# Patient Record
Sex: Female | Born: 1935 | Race: White | Hispanic: No | Smoking: Never smoker
Health system: Southern US, Community
[De-identification: ages and names within clinical notes are randomized; demographics above are authoritative.]

## PROBLEM LIST (undated history)

## (undated) DIAGNOSIS — B019 Varicella without complication: Secondary | ICD-10-CM

## (undated) DIAGNOSIS — E119 Type 2 diabetes mellitus without complications: Secondary | ICD-10-CM

## (undated) DIAGNOSIS — E785 Hyperlipidemia, unspecified: Secondary | ICD-10-CM

## (undated) DIAGNOSIS — I10 Essential (primary) hypertension: Secondary | ICD-10-CM

## (undated) DIAGNOSIS — H269 Unspecified cataract: Secondary | ICD-10-CM

## (undated) DIAGNOSIS — Z460 Encounter for fitting and adjustment of spectacles and contact lenses: Secondary | ICD-10-CM

## (undated) DIAGNOSIS — K5140 Inflammatory polyps of colon without complications: Secondary | ICD-10-CM

## (undated) DIAGNOSIS — C50919 Malignant neoplasm of unspecified site of unspecified female breast: Secondary | ICD-10-CM

## (undated) HISTORY — DX: Essential (primary) hypertension: I10

## (undated) HISTORY — DX: Unspecified cataract: H26.9

## (undated) HISTORY — DX: Hyperlipidemia, unspecified: E78.5

## (undated) HISTORY — DX: Malignant neoplasm of unspecified site of unspecified female breast: C50.919

## (undated) HISTORY — DX: Inflammatory polyps of colon without complications: K51.40

---

## 1998-03-09 ENCOUNTER — Other Ambulatory Visit: Admission: RE | Admit: 1998-03-09 | Discharge: 1998-03-09 | Payer: Self-pay | Admitting: Gynecology

## 1999-03-29 ENCOUNTER — Other Ambulatory Visit: Admission: RE | Admit: 1999-03-29 | Discharge: 1999-03-29 | Payer: Self-pay | Admitting: Gynecology

## 2000-04-07 ENCOUNTER — Encounter: Payer: Self-pay | Admitting: Gynecology

## 2000-04-07 ENCOUNTER — Encounter: Admission: RE | Admit: 2000-04-07 | Discharge: 2000-04-07 | Payer: Self-pay | Admitting: Gynecology

## 2000-04-07 ENCOUNTER — Other Ambulatory Visit: Admission: RE | Admit: 2000-04-07 | Discharge: 2000-04-07 | Payer: Self-pay | Admitting: Gynecology

## 2000-08-17 ENCOUNTER — Other Ambulatory Visit: Admission: RE | Admit: 2000-08-17 | Discharge: 2000-08-17 | Payer: Self-pay | Admitting: Gynecology

## 2001-04-07 ENCOUNTER — Encounter: Admission: RE | Admit: 2001-04-07 | Discharge: 2001-04-07 | Payer: Self-pay | Admitting: Internal Medicine

## 2001-04-07 ENCOUNTER — Other Ambulatory Visit: Admission: RE | Admit: 2001-04-07 | Discharge: 2001-04-07 | Payer: Self-pay | Admitting: Gynecology

## 2001-04-07 ENCOUNTER — Encounter: Payer: Self-pay | Admitting: Gynecology

## 2002-04-07 ENCOUNTER — Encounter: Admission: RE | Admit: 2002-04-07 | Discharge: 2002-04-07 | Payer: Self-pay | Admitting: Gynecology

## 2002-04-07 ENCOUNTER — Other Ambulatory Visit: Admission: RE | Admit: 2002-04-07 | Discharge: 2002-04-07 | Payer: Self-pay | Admitting: Gynecology

## 2002-04-07 ENCOUNTER — Encounter: Payer: Self-pay | Admitting: Gynecology

## 2003-04-12 ENCOUNTER — Encounter: Payer: Self-pay | Admitting: Gynecology

## 2003-04-12 ENCOUNTER — Encounter: Admission: RE | Admit: 2003-04-12 | Discharge: 2003-04-12 | Payer: Self-pay | Admitting: Gynecology

## 2003-04-17 ENCOUNTER — Other Ambulatory Visit: Admission: RE | Admit: 2003-04-17 | Discharge: 2003-04-17 | Payer: Self-pay | Admitting: Gynecology

## 2003-09-04 ENCOUNTER — Other Ambulatory Visit: Admission: RE | Admit: 2003-09-04 | Discharge: 2003-09-04 | Payer: Self-pay | Admitting: Gynecology

## 2004-04-16 ENCOUNTER — Other Ambulatory Visit: Admission: RE | Admit: 2004-04-16 | Discharge: 2004-04-16 | Payer: Self-pay | Admitting: Gynecology

## 2005-05-01 ENCOUNTER — Other Ambulatory Visit: Admission: RE | Admit: 2005-05-01 | Discharge: 2005-05-01 | Payer: Self-pay | Admitting: Gynecology

## 2005-05-20 ENCOUNTER — Encounter: Admission: RE | Admit: 2005-05-20 | Discharge: 2005-05-20 | Payer: Self-pay | Admitting: Gynecology

## 2005-06-30 ENCOUNTER — Other Ambulatory Visit: Admission: RE | Admit: 2005-06-30 | Discharge: 2005-06-30 | Payer: Self-pay | Admitting: Gynecology

## 2006-05-04 ENCOUNTER — Other Ambulatory Visit: Admission: RE | Admit: 2006-05-04 | Discharge: 2006-05-04 | Payer: Self-pay | Admitting: Gynecology

## 2006-05-21 ENCOUNTER — Encounter: Admission: RE | Admit: 2006-05-21 | Discharge: 2006-05-21 | Payer: Self-pay | Admitting: Gynecology

## 2007-05-05 ENCOUNTER — Other Ambulatory Visit: Admission: RE | Admit: 2007-05-05 | Discharge: 2007-05-05 | Payer: Self-pay | Admitting: Gynecology

## 2007-05-24 ENCOUNTER — Encounter: Admission: RE | Admit: 2007-05-24 | Discharge: 2007-05-24 | Payer: Self-pay | Admitting: Gynecology

## 2008-05-25 ENCOUNTER — Encounter: Admission: RE | Admit: 2008-05-25 | Discharge: 2008-05-25 | Payer: Self-pay | Admitting: Endocrinology

## 2008-07-04 ENCOUNTER — Ambulatory Visit: Payer: Self-pay | Admitting: Unknown Physician Specialty

## 2009-06-12 ENCOUNTER — Encounter: Admission: RE | Admit: 2009-06-12 | Discharge: 2009-06-12 | Payer: Self-pay | Admitting: Gynecology

## 2009-12-22 LAB — HM PAP SMEAR

## 2010-06-13 ENCOUNTER — Encounter: Admission: RE | Admit: 2010-06-13 | Discharge: 2010-06-13 | Payer: Self-pay | Admitting: Gynecology

## 2011-05-16 ENCOUNTER — Other Ambulatory Visit: Payer: Self-pay | Admitting: Gynecology

## 2011-05-16 DIAGNOSIS — Z1231 Encounter for screening mammogram for malignant neoplasm of breast: Secondary | ICD-10-CM

## 2011-06-17 ENCOUNTER — Other Ambulatory Visit: Payer: Self-pay | Admitting: Gynecology

## 2011-06-17 ENCOUNTER — Ambulatory Visit
Admission: RE | Admit: 2011-06-17 | Discharge: 2011-06-17 | Disposition: A | Discharge status: Home / Self Care | Payer: PRIVATE HEALTH INSURANCE | Source: Ambulatory Visit | Attending: Gynecology | Admitting: Gynecology

## 2011-06-17 ENCOUNTER — Other Ambulatory Visit: Payer: Self-pay | Admitting: Unknown Physician Specialty

## 2011-06-17 DIAGNOSIS — Z1231 Encounter for screening mammogram for malignant neoplasm of breast: Secondary | ICD-10-CM

## 2011-09-23 HISTORY — PX: KNEE ARTHROSCOPY W/ MENISCAL REPAIR: SHX1877

## 2011-10-07 ENCOUNTER — Ambulatory Visit: Payer: Self-pay | Admitting: Unknown Physician Specialty

## 2011-10-23 LAB — HM COLONOSCOPY

## 2012-05-21 ENCOUNTER — Other Ambulatory Visit: Payer: Self-pay | Admitting: Internal Medicine

## 2012-05-21 DIAGNOSIS — Z1231 Encounter for screening mammogram for malignant neoplasm of breast: Secondary | ICD-10-CM

## 2012-06-17 ENCOUNTER — Ambulatory Visit
Admission: RE | Admit: 2012-06-17 | Discharge: 2012-06-17 | Disposition: A | Discharge status: Home / Self Care | Payer: PRIVATE HEALTH INSURANCE | Source: Ambulatory Visit | Attending: Internal Medicine | Admitting: Internal Medicine

## 2012-06-17 DIAGNOSIS — Z1231 Encounter for screening mammogram for malignant neoplasm of breast: Secondary | ICD-10-CM

## 2012-06-18 ENCOUNTER — Other Ambulatory Visit: Payer: Self-pay | Admitting: Internal Medicine

## 2012-06-18 DIAGNOSIS — R928 Other abnormal and inconclusive findings on diagnostic imaging of breast: Secondary | ICD-10-CM

## 2012-07-02 ENCOUNTER — Ambulatory Visit
Admission: RE | Admit: 2012-07-02 | Discharge: 2012-07-02 | Disposition: A | Discharge status: Home / Self Care | Payer: PRIVATE HEALTH INSURANCE | Source: Ambulatory Visit | Attending: Internal Medicine | Admitting: Internal Medicine

## 2012-07-02 ENCOUNTER — Other Ambulatory Visit: Payer: Self-pay | Admitting: Internal Medicine

## 2012-07-02 DIAGNOSIS — R928 Other abnormal and inconclusive findings on diagnostic imaging of breast: Secondary | ICD-10-CM

## 2012-07-05 ENCOUNTER — Other Ambulatory Visit: Payer: Self-pay | Admitting: Internal Medicine

## 2012-07-05 DIAGNOSIS — C50912 Malignant neoplasm of unspecified site of left female breast: Secondary | ICD-10-CM

## 2012-07-06 ENCOUNTER — Telehealth: Payer: Self-pay | Admitting: *Deleted

## 2012-07-06 ENCOUNTER — Other Ambulatory Visit: Payer: Self-pay | Admitting: *Deleted

## 2012-07-06 DIAGNOSIS — C50519 Malignant neoplasm of lower-outer quadrant of unspecified female breast: Secondary | ICD-10-CM | POA: Insufficient documentation

## 2012-07-06 NOTE — Telephone Encounter (Signed)
Confirmed [facility] for 07/14/12 at 1200 .  Instructions and contact information given.

## 2012-07-07 ENCOUNTER — Encounter: Payer: Self-pay | Admitting: *Deleted

## 2012-07-07 NOTE — Progress Notes (Signed)
Mailed after appt letter to pt. 

## 2012-07-08 ENCOUNTER — Ambulatory Visit
Admission: RE | Admit: 2012-07-08 | Discharge: 2012-07-08 | Disposition: A | Discharge status: Home / Self Care | Payer: PRIVATE HEALTH INSURANCE | Source: Ambulatory Visit | Attending: Internal Medicine | Admitting: Internal Medicine

## 2012-07-08 DIAGNOSIS — C50912 Malignant neoplasm of unspecified site of left female breast: Secondary | ICD-10-CM

## 2012-07-08 MED ORDER — GADOBENATE DIMEGLUMINE 529 MG/ML IV SOLN
8.0000 mL | Freq: Once | INTRAVENOUS | Status: AC | PRN
  Administered 2012-07-08: 8 mL via INTRAVENOUS

## 2012-07-14 ENCOUNTER — Ambulatory Visit: Payer: PRIVATE HEALTH INSURANCE

## 2012-07-14 ENCOUNTER — Other Ambulatory Visit (HOSPITAL_BASED_OUTPATIENT_CLINIC_OR_DEPARTMENT_OTHER): Payer: PRIVATE HEALTH INSURANCE | Admitting: Lab

## 2012-07-14 ENCOUNTER — Encounter: Payer: Self-pay | Admitting: Specialist

## 2012-07-14 ENCOUNTER — Ambulatory Visit (HOSPITAL_BASED_OUTPATIENT_CLINIC_OR_DEPARTMENT_OTHER): Payer: PRIVATE HEALTH INSURANCE | Admitting: General Surgery

## 2012-07-14 ENCOUNTER — Encounter: Payer: Self-pay | Admitting: Radiation Oncology

## 2012-07-14 ENCOUNTER — Encounter: Payer: Self-pay | Admitting: *Deleted

## 2012-07-14 ENCOUNTER — Ambulatory Visit
Admission: RE | Admit: 2012-07-14 | Discharge: 2012-07-14 | Disposition: A | Discharge status: Home / Self Care | Payer: PRIVATE HEALTH INSURANCE | Source: Ambulatory Visit | Attending: Radiation Oncology | Admitting: Radiation Oncology

## 2012-07-14 ENCOUNTER — Ambulatory Visit: Payer: PRIVATE HEALTH INSURANCE | Admitting: Physical Therapy

## 2012-07-14 ENCOUNTER — Ambulatory Visit (HOSPITAL_BASED_OUTPATIENT_CLINIC_OR_DEPARTMENT_OTHER): Payer: PRIVATE HEALTH INSURANCE | Admitting: Oncology

## 2012-07-14 ENCOUNTER — Encounter (INDEPENDENT_AMBULATORY_CARE_PROVIDER_SITE_OTHER): Payer: Self-pay | Admitting: General Surgery

## 2012-07-14 VITALS — BP 157/82 | HR 87 | Temp 98.9°F | Resp 20 | Ht 66.0 in | Wt 176.0 lb

## 2012-07-14 DIAGNOSIS — C50519 Malignant neoplasm of lower-outer quadrant of unspecified female breast: Secondary | ICD-10-CM

## 2012-07-14 DIAGNOSIS — Z170 Estrogen receptor positive status [ER+]: Secondary | ICD-10-CM

## 2012-07-14 LAB — CBC WITH DIFFERENTIAL/PLATELET
BASO%: 0.8 % (ref 0.0–2.0)
Eosinophils Absolute: 0.1 10*3/uL (ref 0.0–0.5)
MCHC: 33.5 g/dL (ref 31.5–36.0)
MONO#: 0.7 10*3/uL (ref 0.1–0.9)
NEUT#: 4.2 10*3/uL (ref 1.5–6.5)
Platelets: 249 10*3/uL (ref 145–400)
RBC: 4.74 10*6/uL (ref 3.70–5.45)
RDW: 13.1 % (ref 11.2–14.5)
WBC: 8.5 10*3/uL (ref 3.9–10.3)
lymph#: 3.4 10*3/uL — ABNORMAL HIGH (ref 0.9–3.3)

## 2012-07-14 LAB — COMPREHENSIVE METABOLIC PANEL
ALT: 29 U/L (ref 0–35)
Albumin: 4.1 g/dL (ref 3.5–5.2)
CO2: 30 mEq/L (ref 19–32)
Chloride: 102 mEq/L (ref 96–112)
Glucose, Bld: 98 mg/dL (ref 70–99)
Potassium: 4 mEq/L (ref 3.5–5.3)
Sodium: 141 mEq/L (ref 135–145)
Total Protein: 7.5 g/dL (ref 6.0–8.3)

## 2012-07-14 LAB — CANCER ANTIGEN 27.29: CA 27.29: 35 U/mL (ref 0–39)

## 2012-07-14 NOTE — Progress Notes (Signed)
Patient ID: Haley Love, female   DOB: 02/09/1934, 78 y.o.   MRN: [id number]  No chief complaint on file.   HPI Haley Love is a 78 y.o. female.  She is referred by Dr. 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  This patient is quite healthy for her age. She has had no prior breast problems. Recent recent screening mammograms show a 1 cm spiculated mass in the left breast at the 3:30 position, 5 cm from the nipple. Ultrasound of the left axilla is normal.  Image guided biopsy shows mixed invasive ductal carcinoma and DCIS, ER 100%, PR 39%, proliferation index 7%, HER-2/neu negative.  MRI was performed showing a 9 mm mass in the left breast at the 3:30 position. A solitary finding. The MRI was otherwise negative.  We have discussed her imaging studies, pathology slides, and treatment planning in breast conference this morning.  The patient is strongly motivated towards breast conservation surgery.  Family history reveals breast cancer in a paternal aunt and in the daughter of the same paternal aunt. HPI  Past Medical History  Diagnosis Date  . Breast cancer   . Hypertension     No past surgical history on file.  Family History  Problem Relation Age of Onset  . Lung cancer Mother   . Throat cancer Father     Social History History  Substance Use Topics  . Smoking status: Never Smoker   . Smokeless tobacco: Not on file  . Alcohol Use: Yes    No Known Allergies  Current Outpatient Prescriptions  Medication Sig Dispense Refill  . aspirin 81 MG tablet Take 81 mg by mouth daily.      Marland Kitchen lisinopril-hydrochlorothiazide (PRINZIDE,ZESTORETIC) 10-12.5 MG per tablet Take 1 tablet by mouth daily.      . Multiple Vitamins-Minerals (MULTIVITAMIN WITH MINERALS) tablet Take 1 tablet by mouth daily.        Review of Systems Review of Systems  Constitutional: Negative for fever, chills and  unexpected weight change.  HENT: Negative for hearing loss, congestion, sore throat, trouble swallowing and voice change.   Eyes: Negative for visual disturbance.  Respiratory: Negative for cough and wheezing.   Cardiovascular: Negative for chest pain, palpitations and leg swelling.  Gastrointestinal: Negative for nausea, vomiting, abdominal pain, diarrhea, constipation, blood in stool, abdominal distention and anal bleeding.  Genitourinary: Negative for hematuria, vaginal bleeding and difficulty urinating.  Musculoskeletal: Negative for arthralgias.  Skin: Negative for rash and wound.  Neurological: Negative for seizures, syncope and headaches.  Hematological: Negative for adenopathy. Does not bruise/bleed easily.  Psychiatric/Behavioral: Negative for confusion.    There were no vitals taken for this visit.  Physical Exam Physical Exam  Constitutional: She is oriented to person, place, and time. She appears well-developed and well-nourished. No distress.  HENT:  Head: Normocephalic and atraumatic.  Nose: Nose normal.  Mouth/Throat: No oropharyngeal exudate.  Eyes: Conjunctivae and EOM are normal. Pupils are equal, round, and reactive to light. Left eye exhibits no discharge. No scleral icterus.  Neck: Neck supple. No JVD present. No tracheal deviation present. No thyromegaly present.  Cardiovascular: Normal rate, regular rhythm, normal heart sounds and intact distal pulses.   No murmur heard. Pulmonary/Chest: Effort normal and breath sounds normal. No respiratory distress. She has no wheezes. She has no rales. She exhibits no tenderness.       Breasts are medium size. Small bruise  lower outer quadrant left breast, no palpable mass in either breast. No other skin change. No axillary adenopathy.  Abdominal: Soft. Bowel sounds are normal. She exhibits no distension and no mass. There is no tenderness. There is no rebound and no guarding.  Musculoskeletal: She exhibits no edema and no  tenderness.  Lymphadenopathy:    She has no cervical adenopathy.  Neurological: She is alert and oriented to person, place, and time. She exhibits normal muscle tone. Coordination normal.  Skin: Skin is warm. No rash noted. She is not diaphoretic. No erythema. No pallor.  Psychiatric: She has a normal mood and affect. Her behavior is normal. Judgment and thought content normal.    Data Reviewed We have reviewed her imaging studies and pathology slides at breast conference this morning. I have coordinated her treatment plan with Dr. Dr. [name] and Dr. Gery Pray.  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 ER 100%, PR 39%, with HER-2 negative, perforation index 7%. Clinical stage T1b, N0.  Hypertension  Patient desires breast conservation surgery.    Plan    We had a long discussion about treatment options including partial mastectomy, total mastectomy, sentinel node biopsy, as well as radiation therapy and antiestrogen therapy.  She will be scheduled for left partial mastectomy with needle localization. We do not think that she would benefit from sentinel node biopsy, and we explained that to her. She is comfortable with this plan  I discussed the indications, details, techniques, and numerous risks in detail with the patient and her family. She understands these issues. Her questions were answered. She agrees with this plan.       Edsel Petrin. Dalbert Batman, M.D., [facility] General and Minimally invasive Surgery Breast and Colorectal Surgery Office:   [phone] Pager:   [phone]  07/14/2012, 4:10 PM

## 2012-07-14 NOTE — Progress Notes (Signed)
I met Haley Love in multidisciplinary clinic today.  In reviewing her distress scale, she indicated her distress level is "0".  She said she is very peaceful about her diagnosis and did not feel she needs any support services at this time.

## 2012-07-14 NOTE — Progress Notes (Signed)
Radiation Oncology         [phone] ________________________________  Initial outpatient Consultation  Name: Haley Love MRN: [id number]  Date: 07/14/2012  DOB: 07-14-1934  CC:No primary provider on file.  Adin Hector, MD   REFERRING PHYSICIAN: Adin Hector, MD  DIAGNOSIS: The encounter diagnosis was Cancer of lower-outer quadrant of female breast.  HISTORY OF PRESENT ILLNESS::Haley Love is a 78 y.o. female who is seen as part of the multidisciplinary breast clinic. Earlier this year the patient was noted to have some changes within the lower outer aspect of the left breast on routine screening mammography. A digital diagnostic left mammogram revealed a persistent 1 cm spiculated mass within the lateral aspect of the breast. On ultrasound this showed an irregular hypoechoic mass which measured 8 x 6 x 6 mm on ultrasound. Patient she did undergo biopsy of this area which revealed invasive ductal carcinoma with some associated ductal carcinoma in situ. The tumor was felt to be low-grade. Estrogen and progesterone receptor receptors were +100% and 39% respectively. the patient s did undergo a MRI of the bilateral breast area which revealed a solitary mass within the 3:30 position of left breast. This measures 9 mm in greatest dimension. There is no other lesions noted with either breast or suspicious axillary lymph nodes. With this information the patient seen in the multidisciplinary breast clinic.   PREVIOUS RADIATION THERAPY: No  PAST MEDICAL HISTORY:  has a past medical history of Breast cancer and Hypertension.    PAST SURGICAL HISTORY:No past surgical history on file.  FAMILY HISTORY: family history includes Lung cancer in her mother and Throat cancer in her father.  SOCIAL HISTORY:  reports that she has never smoked. She does not have any smokeless tobacco history on file. She reports that she drinks alcohol. She reports that she does not use illicit  drugs.  ALLERGIES: Review of patient's allergies indicates no known allergies.  MEDICATIONS:  Current Outpatient Prescriptions  Medication Sig Dispense Refill  . aspirin 81 MG tablet Take 81 mg by mouth daily.      Marland Kitchen lisinopril-hydrochlorothiazide (PRINZIDE,ZESTORETIC) 10-12.5 MG per tablet Take 1 tablet by mouth daily.      . Multiple Vitamins-Minerals (MULTIVITAMIN WITH MINERALS) tablet Take 1 tablet by mouth daily.        REVIEW OF SYSTEMS:  A 15 point review of systems is documented in the electronic medical record. This was obtained by the nursing staff. However, I reviewed this with the patient to discuss relevant findings and make appropriate changes. Prior to biopsy the patient denied any pain in the breast, nipple discharge or bleeding. She denies any bone pain or headaches.   PHYSICAL EXAM:  vitals were not taken for this visit. this is a healthy-appearing female who appears younger than her stated age. Examination of the neck and supraclavicular region reveals no evidence of adenopathy. The axillary areas are free of adenopathy. Examination of lungs reveals them to be clear. The heart has a regular rhythm and rate. Examination of the left breast reveals bruising in the lower outer aspect of the breast. I am unable to palpate a mass within the left breast. There is no nipple discharge or bleeding noted. Examination right breast reveals no mass or nipple discharge or bleeding.   LABORATORY DATA:  Lab Results  Component Value Date   WBC 8.5 07/14/2012   HGB 14.1 07/14/2012   HCT 42.2 07/14/2012   MCV 89.1 07/14/2012   PLT 249 07/14/2012  Lab Results  Component Value Date   NA 141 07/14/2012   K 4.0 07/14/2012   CL 102 07/14/2012   CO2 30 07/14/2012   Lab Results  Component Value Date   ALT 29 07/14/2012   AST 25 07/14/2012   ALKPHOS 54 07/14/2012   BILITOT 0.4 07/14/2012     RADIOGRAPHY: US Breast Left  07/02/2012  *RADIOLOGY REPORT*  Clinical Data:  further evaluation of left  asymmetry  DIGITAL DIAGNOSTIC LEFT MAMMOGRAM  AND LEFT BREAST ULTRASOUND:  Comparison:  06/17/11, 06/17/12  Findings:  There is a persistent 1cm spiculated mass in the lateral left breast on diagnostic images.  On physical exam, there is a firm palpable 1cm mass in the 3:30 position of the left breast, 5cm from the nipple.  Ultrasound is performed, showing a shadowing, irregular hypoechoic mass in the 3:30 position of the left breast, 5cm from the nipple. It measures 8x6x75mm by ultrasound.  Scanning of the left axilla shows normal appearing lymph nodes of normal size.  IMPRESSION: 1cm left breast mass suspicious for carcinoma.  BI-RADS CATEGORY 5:  Highly suggestive of malignancy - appropriate action should be taken.  RECOMMENDATION: The patient will undergo ultrasound-guided core needle biopsy this morning.  Original Report Authenticated By: Rachael Fee, M.D.   Mr Breast Bilateral W Wo Contrast  07/08/2012  *RADIOLOGY REPORT*  Clinical Data: New diagnosis left sided breast cancer.  BILATERAL BREAST MRI WITH AND WITHOUT CONTRAST  Technique: Multiplanar, multisequence MR images of both breasts were obtained prior to and following the intravenous administration of 53ml of Multihance.  Three dimensional images were evaluated at the independent DynaCad workstation.  Comparison:  None.  Findings: Foci of nonspecific enhancement are seen bilaterally.  An irregular, rapidly enhancing mass is seen in the 3:30 position of the left breast, middle third measuring 0.9 x 0.5 x 0.8 cm with biopsy clip artifact corresponding with the biopsy proven malignancy.  No suspicious mass or enhancement is seen in the right breast.  No axillary or internal mammary adenopathy is present.  IMPRESSION: Known solitary malignancy, left breast.  No MRI specific evidence of malignancy, right breast.  RECOMMENDATION: Treatment plan, left breast.  THREE-DIMENSIONAL MR IMAGE RENDERING ON INDEPENDENT WORKSTATION:  Three-dimensional MR images  were rendered by post-processing of the original MR data on an independent workstation.  The three- dimensional MR images were interpreted, and findings were reported in the accompanying complete MRI report for this study.  BI-RADS CATEGORY 6:  Known biopsy-proven malignancy - appropriate action should be taken.  Original Report Authenticated By: Karis Juba, M.D.   [place] Core Biopsy  07/05/2012  **ADDENDUM** CREATED: 07/05/2012 13:26:56  Histologic evaluation demonstrates grade I invasive ductal carcinoma and ductal carcinoma in situ.  This is concordant with the imaging findings.  Results were discussed with the patient by telephone at her request.  She reports no complications from the procedure.  Breast MRI was scheduled for 07/08/2012.  The patient was scheduled to be seen in the [facility] on 07/14/2012.  **END ADDENDUM** SIGNED BY: Ulyess Blossom, M.D.   07/02/2012  *RADIOLOGY REPORT*  Clinical Data:  suspicious left breast mass  ULTRASOUND GUIDED VACUUM ASSISTED CORE BIOPSY OF THE LEFT BREAST  Comparison: priors  I met with the patient and we discussed the procedure of ultrasound- guided biopsy, including benefits and alternatives.  We discussed the high likelihood of a successful procedure. We discussed the risks of the procedure, including infection,  bleeding, tissue injury, clip migration, and inadequate sampling.  Informed, written consent was given.  Using sterile technique, 2% lidocaine, ultrasound guidance, and a 9 gauge vacuum assisted needle, biopsy was performed of the mass in the 3:30 position.  At the conclusion of the procedure, a tissue marker clip was deployed into the biopsy cavity.  Follow-up 2-view mammogram was performed and dictated separately.  IMPRESSION: Ultrasound-guided biopsy of left breast mass.  No apparent complications. Original Report Authenticated By: Chaney Born, M.D.   [place]  L  07/02/2012  *RADIOLOGY REPORT*  Clinical Data:  further evaluation of left asymmetry  DIGITAL DIAGNOSTIC LEFT MAMMOGRAM  AND LEFT BREAST ULTRASOUND:  Comparison:  06/17/11, 06/17/12  Findings:  There is a persistent 1cm spiculated mass in the lateral left breast on diagnostic images.  On physical exam, there is a firm palpable 1cm mass in the 3:30 position of the left breast, 5cm from the nipple.  Ultrasound is performed, showing a shadowing, irregular hypoechoic mass in the 3:30 position of the left breast, 5cm from the nipple. It measures 8x6x90mm by ultrasound.  Scanning of the left axilla shows normal appearing lymph nodes of normal size.  IMPRESSION: 1cm left breast mass suspicious for carcinoma.  BI-RADS CATEGORY 5:  Highly suggestive of malignancy - appropriate action should be taken.  RECOMMENDATION: The patient will undergo ultrasound-guided core needle biopsy this morning.  Original Report Authenticated By: Rachael Fee, M.D.   Mm Digital Diagnostic Unilat L  07/02/2012  *RADIOLOGY REPORT*  Clinical Data:  left breast mass  DIGITAL DIAGNOSTIC LEFT MAMMOGRAM  Comparison:  Previous exams.  Findings:  Films are performed following ultrasound guided biopsy of a mass in the 3:30 position of the left breast.  The marker clip projects over the mass on two views.  IMPRESSION: Appropriate clip positioning.  Original Report Authenticated By: Rachael Fee, M.D.   Mm Digital Screening  06/17/2012  *RADIOLOGY REPORT*  Clinical Data: Screening.  DIGITAL SCREENING MAMMOGRAM WITH CAD  DIGITAL BREAST TOMOSYNTHESIS  Digital breast tomosynthesis images are acquired in two projections.  These images are reviewed in combination with the digital mammogram, confirming the findings below.  Comparison:  Previous exams  Findings: Two views of each breast demonstrate scattered fibroglandular densities.  In the left breast, a possible mass warrants further evaluation with spot compression views and ultrasound.  In the  right breast, no masses or malignant type calcifications are identified.  Images were processed with CAD.  IMPRESSION: Further evaluation is suggested for possible mass in the left breast.  RECOMMENDATION: Diagnostic mammogram and ultrasound of the left breast. (Code:FI-L- 75M)  BI-RADS CATEGORY 0:  Incomplete.  Need additional imaging evaluation and/or prior mammograms for comparison.  Original Report Authenticated By: [email or web address]      IMPRESSION: Clinical stage Ib invasive ductal cancer of the left breast.  She would be a excellent candidate for breast conservation therapy.  In light of her anticipated good prognosis I doubt that she would require both hormonal therapy and radiation therapy as part of her overall management. She seems to be leaning towards hormonal therapy rather than radiation therapy as part of her management.   PLAN: Patient will proceed with partial mastectomy under the direction of Dr. Dalbert Batman. Once her final pathology is available the patient will be seen by Dr. [name]  for consideration for hormonal therapy. If the patient changes her mind and wishes to proceed with radiation therapy as part of her overall management or she does  not tolerate hormonal therapy well, the patient will be seen in radiation oncology for consideration for treatment.    ------------------------------------------------   Blair Promise, PhD, MD

## 2012-07-15 ENCOUNTER — Other Ambulatory Visit (INDEPENDENT_AMBULATORY_CARE_PROVIDER_SITE_OTHER): Payer: Self-pay | Admitting: General Surgery

## 2012-07-15 DIAGNOSIS — C50519 Malignant neoplasm of lower-outer quadrant of unspecified female breast: Secondary | ICD-10-CM

## 2012-07-18 NOTE — Progress Notes (Signed)
Patient ID: Haley Love, female   DOB: 10-28-1934, 78 y.o.   MRN: [id number] ID: Haley Love   DOB: [birth year]  MR#: [id number]  CSN#:[id number]  HISTORY OF PRESENT ILLNESS: Haley Love had screening mammography at the breast Center 06/17/2012 which showed a possible mass in the left breast. Left diagnostic mammography performed 07/02/2012 showed a persistent 1 cm spiculated mass in the lateral left breast, which was palpable and firm. Ultrasound showed an irregular hypoechoic mass measuring 8 mm. The left axilla was unremarkable.  Biopsy of the mass the same day (SAA [id number]) showed an invasive ductal carcinoma, grade 1, estrogen receptor 100% progesterone receptor 39% positive, with no HER-2 amplification and an MIB-1 of 7%. Bilateral breast MRI were obtained 07/08/2012 this showed an irregular enhancing mass in the left breast, middle third, measuring 9 mm. There were no other suspicious masses in either breast and no axillary or internal mammary adenopathy.  The patient's subsequent history is as detailed below.  INTERVAL HISTORY: Haley Love was seen at the multidisciplinary breast cancer clinic 07/14/2012 accompanied by her son Haley Love  REVIEW OF SYSTEMS: She tolerated the breast biopsy without any unusual bleeding, fever, or pain. She denies any unusual headaches, visual changes, cough, for production, pleurisy, chest pain or palpitations, or change in bowel or bladder habits. A detailed review of systems today was entirely noncontributory.  PAST MEDICAL HISTORY: Past Medical History  Diagnosis Date  . Breast cancer   . Hypertension     PAST SURGICAL HISTORY: No past surgical history on file.  FAMILY HISTORY Family History  Problem Relation Age of Onset  . Lung cancer Mother   . Throat cancer Father    the patient's father died at the age of 50 from cancer of the throat which had been diagnosed 6 years earlier. The patient's mother died at the age of 45. She had cancer of the lung lining  diagnosed at age 68. The patient had 4 brothers, no sisters. There is no other cancer history in the immediate family.  GYNECOLOGIC HISTORY: Menarche age 76. Change of life age 48. She took hormone replacement approximately 10 years. She is GX P2, with first live birth at age 58.  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 She is divorced. Her son Haley Love (goes by "J.") also lives in [place] and is self-employed of running a Education administrator business. Daughter Haley Love for L. lives in [place]. She works as a Electrical engineer. The patient has 3 grandchildren. She attends a [employer].  ADVANCED DIRECTIVES: In place  HEALTH MAINTENANCE: History  Substance Use Topics  . Smoking status: Never Smoker   . Smokeless tobacco: Not on file  . Alcohol Use: Yes     Colonoscopy: 2012  PAP: 2011  Bone density: 2011  Lipid panel:  No Known Allergies  Current Outpatient Prescriptions  Medication Sig Dispense Refill  . aspirin 81 MG tablet Take 81 mg by mouth daily.      Marland Kitchen lisinopril-hydrochlorothiazide (PRINZIDE,ZESTORETIC) 10-12.5 MG per tablet Take 1 tablet by mouth daily.      . Multiple Vitamins-Minerals (MULTIVITAMIN WITH MINERALS) tablet Take 1 tablet by mouth daily.        OBJECTIVE: Haley Love who appears well Filed Vitals:   07/14/12 1240  BP: 157/82  Pulse: 87  Temp: 98.9 F (37.2 C)  Resp: 20     Body mass index is 28.41 kg/(m^2).    ECOG FS: 1 Sclerae  unicteric Oropharynx clear No cervical or supraclavicular adenopathy Lungs no rales or rhonchi Heart regular rate and rhythm Abd benign MSK no focal spinal tenderness, no peripheral edema Neuro: nonfocal Breasts: The right breast is unremarkable. The left breast is status post recent biopsy. There is at very minimal ecchymosis present. There are no skin changes or nipple retraction of concern. I do not palpate a definite mass in the left breast or left is  alive.  LAB RESULTS: Lab Results  Component Value Date   WBC 8.5 07/14/2012   NEUTROABS 4.2 07/14/2012   HGB 14.1 07/14/2012   HCT 42.2 07/14/2012   MCV 89.1 07/14/2012   PLT 249 07/14/2012      Chemistry      Component Value Date/Time   NA 141 07/14/2012 1204   K 4.0 07/14/2012 1204   CL 102 07/14/2012 1204   CO2 30 07/14/2012 1204   BUN 19 07/14/2012 1204   CREATININE 1.03 07/14/2012 1204      Component Value Date/Time   CALCIUM 10.2 07/14/2012 1204   ALKPHOS 54 07/14/2012 1204   AST 25 07/14/2012 1204   ALT 29 07/14/2012 1204   BILITOT 0.4 07/14/2012 1204       Lab Results  Component Value Date   LABCA2 35 07/14/2012    No components found with this basename: [id number]    No results found for this basename: INR:1;PROTIME:1 in the last 168 hours  Urinalysis No results found for this basename: colorurine, appearanceur, labspec, phurine, glucoseu, hgbur, bilirubinur, ketonesur, proteinur, urobilinogen, nitrite, leukocytesur    STUDIES: US Breast Left  07/02/2012  *RADIOLOGY REPORT*  Clinical Data:  further evaluation of left asymmetry  DIGITAL DIAGNOSTIC LEFT MAMMOGRAM  AND LEFT BREAST ULTRASOUND:  Comparison:  06/17/11, 06/17/12  Findings:  There is a persistent 1cm spiculated mass in the lateral left breast on diagnostic images.  On physical exam, there is a firm palpable 1cm mass in the 3:30 position of the left breast, 5cm from the nipple.  Ultrasound is performed, showing a shadowing, irregular hypoechoic mass in the 3:30 position of the left breast, 5cm from the nipple. It measures 8x6x60mm by ultrasound.  Scanning of the left axilla shows normal appearing lymph nodes of normal size.  IMPRESSION: 1cm left breast mass suspicious for carcinoma.  BI-RADS CATEGORY 5:  Highly suggestive of malignancy - appropriate action should be taken.  RECOMMENDATION: The patient will undergo ultrasound-guided core needle biopsy this morning.  Original Report Authenticated By: Rachael Fee, M.D.    Mr Breast Bilateral W Wo Contrast  07/08/2012  *RADIOLOGY REPORT*  Clinical Data: New diagnosis left sided breast cancer.  BILATERAL BREAST MRI WITH AND WITHOUT CONTRAST  Technique: Multiplanar, multisequence MR images of both breasts were obtained prior to and following the intravenous administration of 57ml of Multihance.  Three dimensional images were evaluated at the independent DynaCad workstation.  Comparison:  None.  Findings: Foci of nonspecific enhancement are seen bilaterally.  An irregular, rapidly enhancing mass is seen in the 3:30 position of the left breast, middle third measuring 0.9 x 0.5 x 0.8 cm with biopsy clip artifact corresponding with the biopsy proven malignancy.  No suspicious mass or enhancement is seen in the right breast.  No axillary or internal mammary adenopathy is present.  IMPRESSION: Known solitary malignancy, left breast.  No MRI specific evidence of malignancy, right breast.  RECOMMENDATION: Treatment plan, left breast.  THREE-DIMENSIONAL MR IMAGE RENDERING ON INDEPENDENT WORKSTATION:  Three-dimensional MR images were rendered by post-processing  of the original MR data on an independent workstation.  The three- dimensional MR images were interpreted, and findings were reported in the accompanying complete MRI report for this study.  BI-RADS CATEGORY 6:  Known biopsy-proven malignancy - appropriate action should be taken.  Original Report Authenticated By: Karis Juba, M.D.   [place] Core Biopsy  07/05/2012  **ADDENDUM** CREATED: 07/05/2012 13:26:56  Histologic evaluation demonstrates grade I invasive ductal carcinoma and ductal carcinoma in situ.  This is concordant with the imaging findings.  Results were discussed with the patient by telephone at her request.  She reports no complications from the procedure.  Breast MRI was scheduled for 07/08/2012.  The patient was scheduled to be seen in the [facility] at [facility] on  07/14/2012.  **END ADDENDUM** SIGNED BY: Ulyess Blossom, M.D.   07/02/2012  *RADIOLOGY REPORT*  Clinical Data:  suspicious left breast mass  ULTRASOUND GUIDED VACUUM ASSISTED CORE BIOPSY OF THE LEFT BREAST  Comparison: priors  I met with the patient and we discussed the procedure of ultrasound- guided biopsy, including benefits and alternatives.  We discussed the high likelihood of a successful procedure. We discussed the risks of the procedure, including infection, bleeding, tissue injury, clip migration, and inadequate sampling.  Informed, written consent was given.  Using sterile technique, 2% lidocaine, ultrasound guidance, and a 9 gauge vacuum assisted needle, biopsy was performed of the mass in the 3:30 position.  At the conclusion of the procedure, a tissue marker clip was deployed into the biopsy cavity.  Follow-up 2-view mammogram was performed and dictated separately.  IMPRESSION: Ultrasound-guided biopsy of left breast mass.  No apparent complications. Original Report Authenticated By: Chaney Born, M.D.   [place] L  07/02/2012  *RADIOLOGY REPORT*  Clinical Data:  further evaluation of left asymmetry  DIGITAL DIAGNOSTIC LEFT MAMMOGRAM  AND LEFT BREAST ULTRASOUND:  Comparison:  06/17/11, 06/17/12  Findings:  There is a persistent 1cm spiculated mass in the lateral left breast on diagnostic images.  On physical exam, there is a firm palpable 1cm mass in the 3:30 position of the left breast, 5cm from the nipple.  Ultrasound is performed, showing a shadowing, irregular hypoechoic mass in the 3:30 position of the left breast, 5cm from the nipple. It measures 8x6x65mm by ultrasound.  Scanning of the left axilla shows normal appearing lymph nodes of normal size.  IMPRESSION: 1cm left breast mass suspicious for carcinoma.  BI-RADS CATEGORY 5:  Highly suggestive of malignancy - appropriate action should be taken.  RECOMMENDATION: The patient will undergo ultrasound-guided core needle biopsy this  morning.  Original Report Authenticated By: Rachael Fee, M.D.   Mm Digital Diagnostic Unilat L  07/02/2012  *RADIOLOGY REPORT*  Clinical Data:  left breast mass  DIGITAL DIAGNOSTIC LEFT MAMMOGRAM  Comparison:  Previous exams.  Findings:  Films are performed following ultrasound guided biopsy of a mass in the 3:30 position of the left breast.  The marker clip projects over the mass on two views.  IMPRESSION: Appropriate clip positioning.  Original Report Authenticated By: Rachael Fee, M.D.    ASSESSMENT: 78 y.o. Haley Love, [place] Love s/p Left breast biopsy 07/02/2012 for a clinical T1b N0 invasive ductal carcinoma, grade 2, estrogen receptor 100% and progesterone receptor 39% positive, with no HER-2 amplification and an MIB-1 of 7.  PLAN: The patient is a good candidate for breast conservation surgery and that will be the plan. Given her age and the prognostic  pattern of her tumor, which suggests a luminal a subtype, I do not think chemotherapy is indicated, and I am not planning to send an Oncotype, though that might be suggested by NCCN guidelines (the guidelines state specifically they apply only with caution to women over 70). If the patient opts to receive antiestrogen therapy, she also may be able to avoid radiation. We did discuss whether she should have a sentinel lymph node biopsy and I do not think that would alter the treatment as proposed accordingly it is not planned. Tentatively I have made the patient a return appointment with me for mid October area by that time we should have all the information necessary to make a definitive decision regarding adjuvant treatment. We will also try to obtain a copy of her bone density for him 2011 prior to that visit.   Grayson Pfefferle C    07/18/2012

## 2012-07-19 ENCOUNTER — Telehealth: Payer: Self-pay | Admitting: *Deleted

## 2012-07-19 ENCOUNTER — Encounter: Payer: Self-pay | Admitting: *Deleted

## 2012-07-19 NOTE — Progress Notes (Signed)
Called Dr. Verne Spurr office and requested a bone density scan.  It is not onsite, so they are seeing if they can obtain it and they will follow up with me.  Faxed office notes to Dr. Carrie Mew & Dr. Dawson Bills.

## 2012-07-19 NOTE — Telephone Encounter (Signed)
Spoke to pt concerning [place] from 8/21/813.  Pt denies questions or concerns regarding dx or treatment care plan.  Encourage pt to call with needs.  Received verbal understanding.  Contact information given.

## 2012-07-19 NOTE — Telephone Encounter (Signed)
Left vm for pt to return call regarding [place] from 07/14/12.

## 2012-07-21 ENCOUNTER — Encounter (HOSPITAL_BASED_OUTPATIENT_CLINIC_OR_DEPARTMENT_OTHER): Payer: Self-pay | Admitting: *Deleted

## 2012-07-22 ENCOUNTER — Ambulatory Visit
Admission: RE | Admit: 2012-07-22 | Discharge: 2012-07-22 | Disposition: A | Discharge status: Home / Self Care | Payer: Medicare Other | Source: Ambulatory Visit | Attending: General Surgery | Admitting: General Surgery

## 2012-07-22 ENCOUNTER — Telehealth: Payer: Self-pay | Admitting: *Deleted

## 2012-07-22 ENCOUNTER — Encounter: Payer: Self-pay | Admitting: *Deleted

## 2012-07-22 ENCOUNTER — Encounter (HOSPITAL_BASED_OUTPATIENT_CLINIC_OR_DEPARTMENT_OTHER)
Admission: RE | Admit: 2012-07-22 | Discharge: 2012-07-22 | Disposition: A | Discharge status: Home / Self Care | Payer: Medicare Other | Source: Ambulatory Visit | Attending: General Surgery | Admitting: General Surgery

## 2012-07-22 NOTE — Progress Notes (Signed)
Mailed after appt letter to pt. 

## 2012-07-22 NOTE — Telephone Encounter (Signed)
left patient voice message to inform her of the appointment on 09-08-2012 starting at 2:30pm with labs

## 2012-07-23 ENCOUNTER — Encounter: Payer: Self-pay | Admitting: *Deleted

## 2012-07-24 ENCOUNTER — Telehealth: Payer: Self-pay | Admitting: *Deleted

## 2012-07-24 NOTE — Telephone Encounter (Signed)
Please r/s pt f/u to Dr. [name] instead of Dr. Truddie Coco for week of Oct 14th left voice message to inform the patient of the new date and time

## 2012-07-25 HISTORY — PX: BREAST SURGERY: SHX581

## 2012-07-27 NOTE — H&P (Signed)
Haley Love    MRN: [id number]   Description: 78 year old female  Provider: Adin Hector, MD  Department: [facility]       Diagnoses     Cancer of lower-outer quadrant of female breast   - Primary    174.5         History and Physical     Adin Hector, MD   Patient ID: Haley Love, female   DOB: 01-Dec-1933, 78 y.o.   MRN: [id number]            HPI Haley Love is a 78 y.o. female.  She is referred by Dr. 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   This patient is quite healthy for her age. She has had no prior breast problems. Recent recent screening mammograms show a 1 cm spiculated mass in the left breast at the 3:30 position, 5 cm from the nipple. Ultrasound of the left axilla is normal.   Image guided biopsy shows mixed invasive ductal carcinoma and DCIS, ER 100%, PR 39%, proliferation index 7%, HER-2/neu negative.   MRI was performed showing a 9 mm mass in the left breast at the 3:30 position. A solitary finding. The MRI was otherwise negative.   We have discussed her imaging studies, pathology slides, and treatment planning in breast conference this morning.  The patient is strongly motivated towards breast conservation surgery.   Family history reveals breast cancer in a paternal aunt and in the daughter of the same paternal aunt.      Past Medical History   Diagnosis  Date   .  Breast cancer     .  Hypertension        No past surgical history on file.    Family History   Problem  Relation  Age of Onset   .  Lung cancer  Mother     .  Throat cancer  Father        Social History History   Substance Use Topics   .  Smoking status:  Never Smoker    .  Smokeless tobacco:  Not on file   .  Alcohol Use:  Yes      No Known Allergies    Current Outpatient Prescriptions   Medication  Sig  Dispense  Refill   .  aspirin 81 MG  tablet  Take 81 mg by mouth daily.         Marland Kitchen  lisinopril-hydrochlorothiazide (PRINZIDE,ZESTORETIC) 10-12.5 MG per tablet  Take 1 tablet by mouth daily.         .  Multiple Vitamins-Minerals (MULTIVITAMIN WITH MINERALS) tablet  Take 1 tablet by mouth daily.            Review of Systems   Constitutional: Negative for fever, chills and unexpected weight change.  HENT: Negative for hearing loss, congestion, sore throat, trouble swallowing and voice change.   Eyes: Negative for visual disturbance.  Respiratory: Negative for cough and wheezing.   Cardiovascular: Negative for chest pain, palpitations and leg swelling.  Gastrointestinal: Negative for nausea, vomiting, abdominal pain, diarrhea, constipation, blood in stool, abdominal distention and anal bleeding.  Genitourinary: Negative for hematuria, vaginal bleeding and difficulty urinating.  Musculoskeletal: Negative for arthralgias.  Skin: Negative for rash and wound.  Neurological: Negative for seizures, syncope and headaches.  Hematological: Negative for adenopathy. Does not bruise/bleed easily.  Psychiatric/Behavioral: Negative for confusion.    There were no vitals taken for this visit.   Physical Exam  Constitutional: She is oriented to person, place, and time. She appears well-developed and well-nourished. No distress.  HENT:   Head: Normocephalic and atraumatic.   Nose: Nose normal.   Mouth/Throat: No oropharyngeal exudate.  Eyes: Conjunctivae and EOM are normal. Pupils are equal, round, and reactive to light. Left eye exhibits no discharge. No scleral icterus.  Neck: Neck supple. No JVD present. No tracheal deviation present. No thyromegaly present.  Cardiovascular: Normal rate, regular rhythm, normal heart sounds and intact distal pulses.    No murmur heard. Pulmonary/Chest: Effort normal and breath sounds normal. No respiratory distress. She has no wheezes. She has no rales. She exhibits no tenderness.       Breasts are  medium size. Small bruise lower outer quadrant left breast, no palpable mass in either breast. No other skin change. No axillary adenopathy.  Abdominal: Soft. Bowel sounds are normal. She exhibits no distension and no mass. There is no tenderness. There is no rebound and no guarding.  Musculoskeletal: She exhibits no edema and no tenderness.  Lymphadenopathy:    She has no cervical adenopathy.  Neurological: She is alert and oriented to person, place, and time. She exhibits normal muscle tone. Coordination normal.  Skin: Skin is warm. No rash noted. She is not diaphoretic. No erythema. No pallor.  Psychiatric: She has a normal mood and affect. Her behavior is normal. Judgment and thought content normal.    Data Reviewed We have reviewed her imaging studies and pathology slides at breast conference this morning. I have coordinated her treatment plan with Dr. Dr. [name] and Dr. Gery Pray.   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 ER 100%, PR 39%, with HER-2 negative, perforation index 7%. Clinical stage T1b, N0.   Hypertension   Patient desires breast conservation surgery.   Plan We had a long discussion about treatment options including partial mastectomy, total mastectomy, sentinel node biopsy, as well as radiation therapy and antiestrogen therapy.   She will be scheduled for left partial mastectomy with needle localization. We do not think that she would benefit from sentinel node biopsy, and we explained that to her. She is comfortable with this plan   I discussed the indications, details, techniques, and numerous risks in detail with the patient and her family. She understands these issues. Her questions were answered. She agrees with this plan.       Edsel Petrin. Dalbert Batman, M.D., [facility] General and Minimally invasive Surgery Breast and Colorectal Surgery Office:   [phone] Pager:    [phone]

## 2012-07-28 ENCOUNTER — Ambulatory Visit (HOSPITAL_BASED_OUTPATIENT_CLINIC_OR_DEPARTMENT_OTHER): Payer: Medicare Other | Admitting: Anesthesiology

## 2012-07-28 ENCOUNTER — Ambulatory Visit (HOSPITAL_BASED_OUTPATIENT_CLINIC_OR_DEPARTMENT_OTHER)
Admission: RE | Admit: 2012-07-28 | Discharge: 2012-07-28 | Disposition: A | Discharge status: Home / Self Care | Payer: Medicare Other | Source: Ambulatory Visit | Attending: General Surgery | Admitting: General Surgery

## 2012-07-28 ENCOUNTER — Ambulatory Visit
Admission: RE | Admit: 2012-07-28 | Discharge: 2012-07-28 | Disposition: A | Discharge status: Home / Self Care | Payer: Medicare Other | Source: Ambulatory Visit | Attending: General Surgery | Admitting: General Surgery

## 2012-07-28 ENCOUNTER — Encounter (HOSPITAL_BASED_OUTPATIENT_CLINIC_OR_DEPARTMENT_OTHER): Payer: Self-pay | Admitting: Anesthesiology

## 2012-07-28 ENCOUNTER — Encounter (HOSPITAL_BASED_OUTPATIENT_CLINIC_OR_DEPARTMENT_OTHER): Payer: Self-pay | Admitting: *Deleted

## 2012-07-28 ENCOUNTER — Encounter (HOSPITAL_BASED_OUTPATIENT_CLINIC_OR_DEPARTMENT_OTHER)
Admission: RE | Disposition: A | Discharge status: Home / Self Care | Payer: Self-pay | Source: Ambulatory Visit | Attending: General Surgery

## 2012-07-28 DIAGNOSIS — D0590 Unspecified type of carcinoma in situ of unspecified breast: Secondary | ICD-10-CM

## 2012-07-28 DIAGNOSIS — C50519 Malignant neoplasm of lower-outer quadrant of unspecified female breast: Secondary | ICD-10-CM

## 2012-07-28 DIAGNOSIS — Z7982 Long term (current) use of aspirin: Secondary | ICD-10-CM | POA: Insufficient documentation

## 2012-07-28 DIAGNOSIS — I10 Essential (primary) hypertension: Secondary | ICD-10-CM | POA: Insufficient documentation

## 2012-07-28 HISTORY — PX: BREAST LUMPECTOMY: SHX2

## 2012-07-28 SURGERY — BREAST LUMPECTOMY WITH NEEDLE LOCALIZATION
Anesthesia: General | Site: Breast | Laterality: Left | Wound class: Clean

## 2012-07-28 MED ORDER — FENTANYL CITRATE 0.05 MG/ML IJ SOLN
INTRAMUSCULAR | Status: DC | PRN
  Administered 2012-07-28: 25 ug via INTRAVENOUS
  Administered 2012-07-28: 50 ug via INTRAVENOUS
  Administered 2012-07-28: 25 ug via INTRAVENOUS

## 2012-07-28 MED ORDER — MORPHINE SULFATE 2 MG/ML IJ SOLN: 2.0000 mg | INTRAMUSCULAR | Status: DC | PRN

## 2012-07-28 MED ORDER — SODIUM CHLORIDE 0.9 % IJ SOLN: 3.0000 mL | INTRAMUSCULAR | Status: DC | PRN

## 2012-07-28 MED ORDER — FENTANYL CITRATE 0.05 MG/ML IJ SOLN: 50.0000 ug | INTRAMUSCULAR | Status: DC | PRN

## 2012-07-28 MED ORDER — SODIUM CHLORIDE 0.9 % IJ SOLN: 3.0000 mL | Freq: Two times a day (BID) | INTRAMUSCULAR | Status: DC

## 2012-07-28 MED ORDER — DEXAMETHASONE SODIUM PHOSPHATE 4 MG/ML IJ SOLN
INTRAMUSCULAR | Status: DC | PRN
  Administered 2012-07-28: 10 mg via INTRAVENOUS

## 2012-07-28 MED ORDER — HEPARIN SODIUM (PORCINE) 5000 UNIT/ML IJ SOLN
5000.0000 [IU] | Freq: Once | INTRAMUSCULAR | Status: AC
  Administered 2012-07-28: 5000 [IU] via SUBCUTANEOUS

## 2012-07-28 MED ORDER — SODIUM CHLORIDE 0.9 % IV SOLN: INTRAVENOUS | Status: DC

## 2012-07-28 MED ORDER — LIDOCAINE HCL (CARDIAC) 20 MG/ML IV SOLN
INTRAVENOUS | Status: DC | PRN
  Administered 2012-07-28: 80 mg via INTRAVENOUS

## 2012-07-28 MED ORDER — PROPOFOL 10 MG/ML IV BOLUS
INTRAVENOUS | Status: DC | PRN
  Administered 2012-07-28: 180 mg via INTRAVENOUS

## 2012-07-28 MED ORDER — CHLORHEXIDINE GLUCONATE 4 % EX LIQD: 1.0000 "application " | Freq: Once | CUTANEOUS | Status: DC

## 2012-07-28 MED ORDER — SODIUM CHLORIDE 0.9 % IV SOLN: 250.0000 mL | INTRAVENOUS | Status: DC | PRN

## 2012-07-28 MED ORDER — MIDAZOLAM HCL 5 MG/5ML IJ SOLN
INTRAMUSCULAR | Status: DC | PRN
  Administered 2012-07-28: 1 mg via INTRAVENOUS

## 2012-07-28 MED ORDER — HYDROMORPHONE HCL PF 1 MG/ML IJ SOLN: 0.2500 mg | INTRAMUSCULAR | Status: DC | PRN

## 2012-07-28 MED ORDER — ONDANSETRON HCL 4 MG/2ML IJ SOLN
INTRAMUSCULAR | Status: DC | PRN
  Administered 2012-07-28: 4 mg via INTRAVENOUS

## 2012-07-28 MED ORDER — CEFAZOLIN SODIUM-DEXTROSE 2-3 GM-% IV SOLR
2.0000 g | INTRAVENOUS | Status: AC
  Administered 2012-07-28: 2 g via INTRAVENOUS

## 2012-07-28 MED ORDER — EPHEDRINE SULFATE 50 MG/ML IJ SOLN
INTRAMUSCULAR | Status: DC | PRN
  Administered 2012-07-28: 10 mg via INTRAVENOUS

## 2012-07-28 MED ORDER — LACTATED RINGERS IV SOLN
INTRAVENOUS | Status: DC
  Administered 2012-07-28 (×2): via INTRAVENOUS

## 2012-07-28 MED ORDER — ACETAMINOPHEN 650 MG RE SUPP: 650.0000 mg | RECTAL | Status: DC | PRN

## 2012-07-28 MED ORDER — MIDAZOLAM HCL 2 MG/2ML IJ SOLN: 1.0000 mg | INTRAMUSCULAR | Status: DC | PRN

## 2012-07-28 MED ORDER — PROMETHAZINE HCL 25 MG/ML IJ SOLN: 6.2500 mg | INTRAMUSCULAR | Status: DC | PRN

## 2012-07-28 MED ORDER — ONDANSETRON HCL 4 MG/2ML IJ SOLN: 4.0000 mg | Freq: Four times a day (QID) | INTRAMUSCULAR | Status: DC | PRN

## 2012-07-28 MED ORDER — MEPERIDINE HCL 25 MG/ML IJ SOLN: 6.2500 mg | INTRAMUSCULAR | Status: DC | PRN

## 2012-07-28 MED ORDER — BUPIVACAINE-EPINEPHRINE 0.5% -1:200000 IJ SOLN
INTRAMUSCULAR | Status: DC | PRN
  Administered 2012-07-28: 12 mL

## 2012-07-28 MED ORDER — KETOROLAC TROMETHAMINE 30 MG/ML IJ SOLN
INTRAMUSCULAR | Status: DC | PRN
  Administered 2012-07-28: 30 mg via INTRAVENOUS

## 2012-07-28 MED ORDER — ACETAMINOPHEN 325 MG PO TABS: 650.0000 mg | ORAL_TABLET | ORAL | Status: DC | PRN

## 2012-07-28 MED ORDER — HYDROCODONE-ACETAMINOPHEN 5-325 MG PO TABS: 1.0000 | ORAL_TABLET | ORAL | Status: AC | PRN

## 2012-07-28 MED ORDER — OXYCODONE HCL 5 MG PO TABS: 5.0000 mg | ORAL_TABLET | ORAL | Status: DC | PRN

## 2012-07-28 SURGICAL SUPPLY — 64 items
APPLIER CLIP 11 MED OPEN (CLIP) ×2
BANDAGE ELASTIC 6 VELCRO ST LF (GAUZE/BANDAGES/DRESSINGS) IMPLANT
BENZOIN TINCTURE PRP APPL 2/3 (GAUZE/BANDAGES/DRESSINGS) IMPLANT
BINDER BREAST LRG (GAUZE/BANDAGES/DRESSINGS) ×2 IMPLANT
BLADE HEX COATED 2.75 (ELECTRODE) ×2 IMPLANT
BLADE SURG 10 STRL SS (BLADE) IMPLANT
BLADE SURG 15 STRL LF DISP TIS (BLADE) ×2 IMPLANT
BLADE SURG 15 STRL SS (BLADE) ×2
CANISTER SUCTION 1200CC (MISCELLANEOUS) ×2 IMPLANT
CHLORAPREP W/TINT 26ML (MISCELLANEOUS) ×2 IMPLANT
CLIP APPLIE 11 MED OPEN (CLIP) ×1 IMPLANT
CLOTH BEACON ORANGE TIMEOUT ST (SAFETY) ×2 IMPLANT
COVER MAYO STAND STRL (DRAPES) ×2 IMPLANT
COVER PROBE W GEL 5X96 (DRAPES) ×2 IMPLANT
COVER TABLE BACK 60X90 (DRAPES) ×2 IMPLANT
DECANTER SPIKE VIAL GLASS SM (MISCELLANEOUS) IMPLANT
DERMABOND ADVANCED (GAUZE/BANDAGES/DRESSINGS)
DERMABOND ADVANCED .7 DNX12 (GAUZE/BANDAGES/DRESSINGS) IMPLANT
DEVICE DUBIN W/COMP PLATE 8390 (MISCELLANEOUS) ×2 IMPLANT
DRAIN CHANNEL 19F RND (DRAIN) IMPLANT
DRAIN HEMOVAC 1/8 X 5 (WOUND CARE) IMPLANT
DRAPE LAPAROSCOPIC ABDOMINAL (DRAPES) ×2 IMPLANT
DRAPE UTILITY XL STRL (DRAPES) ×2 IMPLANT
DRSG PAD ABDOMINAL 8X10 ST (GAUZE/BANDAGES/DRESSINGS) IMPLANT
ELECT REM PT RETURN 9FT ADLT (ELECTROSURGICAL) ×2
ELECTRODE REM PT RTRN 9FT ADLT (ELECTROSURGICAL) ×1 IMPLANT
EVACUATOR SILICONE 100CC (DRAIN) IMPLANT
GAUZE SPONGE 4X4 12PLY STRL LF (GAUZE/BANDAGES/DRESSINGS) IMPLANT
GAUZE SPONGE 4X4 16PLY XRAY LF (GAUZE/BANDAGES/DRESSINGS) IMPLANT
GLOVE BIO SURGEON STRL SZ 6.5 (GLOVE) ×2 IMPLANT
GLOVE BIOGEL PI IND STRL 8 (GLOVE) ×1 IMPLANT
GLOVE BIOGEL PI INDICATOR 8 (GLOVE) ×1
GLOVE ECLIPSE 6.5 STRL STRAW (GLOVE) ×2 IMPLANT
GLOVE EUDERMIC 7 POWDERFREE (GLOVE) ×2 IMPLANT
GOWN PREVENTION PLUS XLARGE (GOWN DISPOSABLE) ×2 IMPLANT
GOWN PREVENTION PLUS XXLARGE (GOWN DISPOSABLE) ×4 IMPLANT
KIT MARKER MARGIN INK (KITS) ×2 IMPLANT
NDL SAFETY ECLIPSE 18X1.5 (NEEDLE) ×1 IMPLANT
NEEDLE HYPO 18GX1.5 SHARP (NEEDLE) ×1
NEEDLE HYPO 25X1 1.5 SAFETY (NEEDLE) ×4 IMPLANT
NS IRRIG 1000ML POUR BTL (IV SOLUTION) ×2 IMPLANT
PACK BASIN DAY SURGERY FS (CUSTOM PROCEDURE TRAY) ×2 IMPLANT
PAD ALCOHOL SWAB (MISCELLANEOUS) ×2 IMPLANT
PENCIL BUTTON HOLSTER BLD 10FT (ELECTRODE) ×2 IMPLANT
PIN SAFETY STERILE (MISCELLANEOUS) IMPLANT
SLEEVE SCD COMPRESS KNEE MED (MISCELLANEOUS) IMPLANT
SPONGE GAUZE 4X4 12PLY (GAUZE/BANDAGES/DRESSINGS) ×2 IMPLANT
SPONGE LAP 18X18 X RAY DECT (DISPOSABLE) IMPLANT
SPONGE LAP 4X18 X RAY DECT (DISPOSABLE) ×2 IMPLANT
STRIP CLOSURE SKIN 1/2X4 (GAUZE/BANDAGES/DRESSINGS) IMPLANT
SUT ETHILON 3 0 FSL (SUTURE) IMPLANT
SUT MNCRL AB 4-0 PS2 18 (SUTURE) ×4 IMPLANT
SUT SILK 2 0 SH (SUTURE) ×2 IMPLANT
SUT VIC AB 2-0 CT1 27 (SUTURE)
SUT VIC AB 2-0 CT1 TAPERPNT 27 (SUTURE) IMPLANT
SUT VIC AB 3-0 SH 27 (SUTURE)
SUT VIC AB 3-0 SH 27X BRD (SUTURE) IMPLANT
SUT VICRYL 3-0 CR8 SH (SUTURE) ×2 IMPLANT
SYR CONTROL 10ML LL (SYRINGE) ×4 IMPLANT
TOWEL OR 17X24 6PK STRL BLUE (TOWEL DISPOSABLE) ×4 IMPLANT
TOWEL OR NON WOVEN STRL DISP B (DISPOSABLE) ×2 IMPLANT
TUBE CONNECTING 20X1/4 (TUBING) ×2 IMPLANT
WATER STERILE IRR 1000ML POUR (IV SOLUTION) ×2 IMPLANT
YANKAUER SUCT BULB TIP NO VENT (SUCTIONS) ×2 IMPLANT

## 2012-07-28 NOTE — Anesthesia Postprocedure Evaluation (Signed)
  Anesthesia Post-op Note  Patient: Haley Love  Procedure(s) Performed: Procedure(s) (LRB) with comments: BREAST LUMPECTOMY WITH NEEDLE LOCALIZATION (Left) - Left partial mastectomy with Needle localization  Patient Location: PACU  Anesthesia Type: General  Level of Consciousness: awake  Airway and Oxygen Therapy: Patient Spontanous Breathing  Post-op Pain: mild  Post-op Assessment: Post-op Vital signs reviewed  Post-op Vital Signs: stable  Complications: No apparent anesthesia complications

## 2012-07-28 NOTE — Anesthesia Procedure Notes (Signed)
Procedure Name: LMA Insertion Date/Time: 07/28/2012 9:58 AM Performed by: Maryella Shivers Pre-anesthesia Checklist: Patient identified, Emergency Drugs available, Suction available and Patient being monitored Patient Re-evaluated:Patient Re-evaluated prior to inductionOxygen Delivery Method: Circle System Utilized Preoxygenation: Pre-oxygenation with 100% oxygen Intubation Type: IV induction Ventilation: Mask ventilation without difficulty LMA: LMA inserted LMA Size: 4.0 Number of attempts: 1 Airway Equipment and Method: bite block Placement Confirmation: positive ETCO2 Tube secured with: Tape Dental Injury: Teeth and Oropharynx as per pre-operative assessment

## 2012-07-28 NOTE — Op Note (Signed)
Patient Name:           Haley Love   Date of Surgery:        07/28/2012  Pre op Diagnosis:      Invasive ductal carcinoma and noninvasive ductal carcinoma left breast, lower outer quadrant, receptor positive, HER-2-negative, clinical stage T1b., N0  Post op Diagnosis:    same  Procedure:                 Left partial mastectomy with needle localization  Surgeon:                     Edsel Petrin. Dalbert Batman, M.D., FACS  Assistant:                      none  Operative Indications:   [name] is a 78 y.o. female. She is referred by Dr. 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  This patient is quite healthy for her age. She has had no prior breast problems. Recent recent screening mammograms show a 1 cm spiculated mass in the left breast at the 3:30 position, 5 cm from the nipple. Ultrasound of the left axilla is normal. Examination reveals no palpable mass and no adenopathy. Image guided biopsy shows mixed invasive ductal carcinoma and DCIS, ER 100%, PR 39%, proliferation index 7%, HER-2/neu negative.  MRI was performed showing a 9 mm mass in the left breast at the 3:30 position. A solitary finding. The MRI was otherwise negative. She has been evaluated and counseled in the multidisciplinary clinic.  She is interested in breast conservation. We did not think that she would benefit from sentinel node biopsy and she is comfortable with this plan. She is brought to the operating room electively.   Operative Findings:       The localizing wire was well placed. The wire and the density and the marker clip were in the lower outer quadrant of the left breast. The specimen mammogram looked good, showing the marker clip and wire tip in the center of the specimen.  Procedure in Detail:          The patient underwent wire localization at the [facility] center of [facility] and was then sent to [facility]. General anesthesia was  induced. Intravenous antibiotics were given. The left breast was prepped and draped in a sterile fashion. Surgical time out was performed. 0.5% Marcaine with epinephrine was used as a local infiltration anesthetic. A radially oriented incision was made in the left breast, lower outer quadrant at about the 4:30 position. Dissection was carried down into the breast tissue and around the localizing wire. The specimen was removed and marked with the 6 color ink kit. Specimen mammogram showed the marker clip and the wire tip to be in the center of the specimen and this was felt to be an adequate excision. The specimen was sent to pathology. The wound was irrigated with saline and hemostasis was achieved with electrocautery. The deeper breast tissues were closed in several layers with interrupted sutures of 3-0 Vicryl and the skin closed with a running subcuticular suture of 4-0 Monocryl and Dermabond. Breast binder was placed and the patient taken to recovery room in stable condition. There were no complications. EBL 10 cc. Counts correct.     Edsel Petrin. Dalbert Batman, M.D., FACS General and Minimally Invasive Surgery Breast and Colorectal Surgery  07/28/2012  10:38 AM

## 2012-07-28 NOTE — Anesthesia Preprocedure Evaluation (Signed)
Anesthesia Evaluation  Patient identified by MRN, date of birth, ID band Patient awake    Reviewed: Allergy & Precautions, H&P , NPO status , Patient's Chart, lab work & pertinent test results  History of Anesthesia Complications Negative for: history of anesthetic complications  Airway Mallampati: I  Neck ROM: Full    Dental  (+) Teeth Intact   Pulmonary neg pulmonary ROS,  breath sounds clear to auscultation        Cardiovascular hypertension, Rhythm:Regular Rate:Normal     Neuro/Psych negative neurological ROS     GI/Hepatic negative GI ROS, Neg liver ROS,   Endo/Other  negative endocrine ROS  Renal/GU negative Renal ROS     Musculoskeletal   Abdominal   Peds  Hematology negative hematology ROS (+)   Anesthesia Other Findings   Reproductive/Obstetrics                           Anesthesia Physical Anesthesia Plan  ASA: II  Anesthesia Plan: General   Post-op Pain Management:    Induction: Intravenous  Airway Management Planned: LMA  Additional Equipment:   Intra-op Plan:   Post-operative Plan: Extubation in OR  Informed Consent: I have reviewed the patients History and Physical, chart, labs and discussed the procedure including the risks, benefits and alternatives for the proposed anesthesia with the patient or authorized representative who has indicated his/her understanding and acceptance.   Dental advisory given  Plan Discussed with: CRNA and Surgeon  Anesthesia Plan Comments:         Anesthesia Quick Evaluation

## 2012-07-28 NOTE — Interval H&P Note (Signed)
History and Physical Interval Note:  07/28/2012 9:53 AM  Haley Love  has presented today for surgery, with the diagnosis of Left breast cancer  The goals and the various methods of treatment have been discussed with the patient and family. After consideration of risks, benefits and other options for treatment, the patient has consented to  Procedure(s) (LRB) with comments: BREAST LUMPECTOMY WITH NEEDLE LOCALIZATION (Left) - Left partial mastectomy with Needle localization as a surgical intervention .  The patient's history has been reviewed and the patient examined today, no change in status, stable for surgery.  I have reviewed the patient's chart and labs.  Questions were answered to the patient's satisfaction.     Adin Hector

## 2012-07-28 NOTE — Transfer of Care (Signed)
Immediate Anesthesia Transfer of Care Note  Patient: Haley Love  Procedure(s) Performed: Procedure(s) (LRB) with comments: BREAST LUMPECTOMY WITH NEEDLE LOCALIZATION (Left) - Left partial mastectomy with Needle localization  Patient Location: PACU  Anesthesia Type: General  Level of Consciousness: sedated  Airway & Oxygen Therapy: Patient Spontanous Breathing and Patient connected to face mask oxygen  Post-op Assessment: Report given to PACU RN and Post -op Vital signs reviewed and stable  Post vital signs: Reviewed and stable  Complications: No apparent anesthesia complications

## 2012-07-29 ENCOUNTER — Telehealth (INDEPENDENT_AMBULATORY_CARE_PROVIDER_SITE_OTHER): Payer: Self-pay | Admitting: General Surgery

## 2012-07-29 NOTE — Telephone Encounter (Signed)
Pathology report shows a positive anterior margin. She will need reexcision.  I called her home phone and her cell phone. I had to leave a message on both of them as there was no answer.  We will try to contact her again today and tomorrow. She will need to come in the office to discuss the planning and implications of reexcision of anterior margin.   Edsel Petrin. Dalbert Batman, M.D., [facility] General and Minimally invasive Surgery Breast and Colorectal Surgery Office:   [phone] Pager:   [phone]

## 2012-07-29 NOTE — Telephone Encounter (Signed)
Haley Love CALLED TO RETURN CALL FROM DR. INGRAM EARLIER TODAY. I WILL ROUT THIS TO DR. INGRAM/GY. I TOLD Haley Love THAT SHE MAY NOT HERE FROM DR. INGRAM UNTIL TOMORROW/PH/-[phone]/GY

## 2012-07-30 ENCOUNTER — Telehealth (INDEPENDENT_AMBULATORY_CARE_PROVIDER_SITE_OTHER): Payer: Self-pay | Admitting: General Surgery

## 2012-07-30 ENCOUNTER — Other Ambulatory Visit (INDEPENDENT_AMBULATORY_CARE_PROVIDER_SITE_OTHER): Payer: Self-pay | Admitting: General Surgery

## 2012-07-30 NOTE — Telephone Encounter (Signed)
Called patient to advise of appointment made at Dr. Darrel Hoover request for 08/04/12 at 5:00. Patient agreed.  Patient to be scheduled to have re-excision of margins.

## 2012-07-30 NOTE — Telephone Encounter (Signed)
Final pathology report up her partial mastectomy reveals a 1.2 cm invasive cancer. The invasive cancer is present at the anterior margin and the noninvasive component is no more than 1 mm from the anterior margin  I called Haley Love and discussed this with her and told her that this area would need to be reexcised. She is very willing to have that done. We will proceed with scheduling. If possible I will bring her into the office preop for an exam.  Edsel Petrin. Dalbert Batman, M.D., [facility] General and Minimally invasive Surgery Breast and Colorectal Surgery Office:   [phone] Pager:   [phone]

## 2012-08-02 ENCOUNTER — Encounter (HOSPITAL_BASED_OUTPATIENT_CLINIC_OR_DEPARTMENT_OTHER): Payer: Self-pay | Admitting: *Deleted

## 2012-08-02 NOTE — Progress Notes (Signed)
Pt here 07/28/12-having re-excision

## 2012-08-04 ENCOUNTER — Encounter (INDEPENDENT_AMBULATORY_CARE_PROVIDER_SITE_OTHER): Payer: Self-pay | Admitting: General Surgery

## 2012-08-04 ENCOUNTER — Ambulatory Visit (INDEPENDENT_AMBULATORY_CARE_PROVIDER_SITE_OTHER): Payer: Medicare Other | Admitting: General Surgery

## 2012-08-04 VITALS — BP 130/84 | HR 70 | Temp 97.4°F | Resp 14 | Ht 67.0 in | Wt 174.6 lb

## 2012-08-04 DIAGNOSIS — C50519 Malignant neoplasm of lower-outer quadrant of unspecified female breast: Secondary | ICD-10-CM

## 2012-08-04 NOTE — Progress Notes (Signed)
Patient ID: Haley Love, female   DOB: [birth year], 78 y.o.   MRN: [id number]  Chief Complaint  Patient presents with  . Pre-op Exam    Re-excision of margins    HPI Haley Love is a 78 y.o. female.   Haley Love is a 78 y.o. female. She wass referred by Dr. 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  This patient is quite healthy for her age. She has had no prior breast problems. Recent recent screening mammograms show a 1 cm spiculated mass in the left breast at the 3:30 position, 5 cm from the nipple. Ultrasound of the left axilla is normal.  Image guided biopsy shows mixed invasive ductal carcinoma and DCIS, ER 100%, PR 39%, proliferation index 7%, HER-2/neu negative.  MRI was performed showing a 9 mm mass in the left breast at the 3:30 position. A solitary finding. The MRI was otherwise negative. Dr. [name], Dr. [name] and I have discussed her imaging studies, pathology slides, and treatment planning . The patient is strongly motivated towards breast conservation surgery. Dr. [name], Dr. [name]  and I decided that we did not think that a sentinel node biopsy would be necessary to direct therapy. Family history reveals breast cancer in a paternal aunt and in the daughter of the same paternal aunt.  On September 4 she underwent left partial mastectomy with needle localization. Pathology showed she had mixed invasive ductal carcinoma and noninvasive cancer, 1.2 cm, invasive cancer was present at the anterior margin. This was not expected as the specimen mammogram showed the marker clip in the center of the specimen. I have discussed this with the patient and her sister-in-law. I advised conservative re-excision of the anterior margin including an ellipse of skin. This is scheduled for this Friday. She is in the office today for a discussion of his treatment plan.  HPI  Past Medical History  Diagnosis  Date  . Breast cancer   . Hypertension   . Contact lens/glasses fitting     Past Surgical History  Procedure Date  . No past surgeries   . Breast surgery 9/13    lt lump    Family History  Problem Relation Age of Onset  . Lung cancer Mother   . Throat cancer Father     Social History History  Substance Use Topics  . Smoking status: Never Smoker   . Smokeless tobacco: Never Used  . Alcohol Use: Yes     1 glass wine week    No Known Allergies  Current Outpatient Prescriptions  Medication Sig Dispense Refill  . aspirin 81 MG tablet Take 81 mg by mouth daily.      Marland Kitchen b complex vitamins tablet Take 1 tablet by mouth daily.      . Cholecalciferol (VITAMIN D-3) 1000 UNITS CAPS Take 1 capsule by mouth at bedtime.      Marland Kitchen CINNAMON PO Take 2,000 mg by mouth at bedtime.      . Coenzyme Q10 (CO Q-10) 200 MG CAPS Take 1 capsule by mouth at bedtime.      . diclofenac (VOLTAREN) 75 MG EC tablet Take 75 mg by mouth 2 (two) times daily.      . Flaxseed, Linseed, (FLAX SEED OIL) 1000 MG CAPS Take by mouth.      Marland Kitchen HYDROcodone-acetaminophen (NORCO/VICODIN) 5-325 MG per tablet Take 1-2 tablets by mouth every 4 (four) hours as needed  for pain.  35 tablet  1  . Krill Oil 1500 MG CAPS Take by mouth.      Marland Kitchen lisinopril-hydrochlorothiazide (PRINZIDE,ZESTORETIC) 10-12.5 MG per tablet Take 1 tablet by mouth daily.      . Magnesium 500 MG CAPS Take by mouth.      . Misc Natural Products (NARCOSOFT HERBAL LAX PO) Take 2 tablets by mouth.      . Multiple Vitamins-Minerals (MULTIVITAMIN WITH MINERALS) tablet Take 1 tablet by mouth daily.      . Red Yeast Rice Extract (RED YEAST RICE PO) Take 1,000 mg by mouth 2 (two) times daily.      . vitamin E 400 UNIT capsule Take 400 Units by mouth daily.        Review of Systems Review of Systems  Constitutional: Negative for fever, chills and unexpected weight change.  HENT: Negative for hearing loss, congestion, sore throat, trouble swallowing and voice  change.   Eyes: Negative for visual disturbance.  Respiratory: Negative for cough and wheezing.   Cardiovascular: Negative for chest pain, palpitations and leg swelling.  Gastrointestinal: Negative for nausea, vomiting, abdominal pain, diarrhea, constipation, blood in stool, abdominal distention and anal bleeding.  Genitourinary: Negative for hematuria, vaginal bleeding and difficulty urinating.  Musculoskeletal: Negative for arthralgias.  Skin: Negative for rash and wound.  Neurological: Negative for seizures, syncope and headaches.  Hematological: Negative for adenopathy. Does not bruise/bleed easily.  Psychiatric/Behavioral: Negative for confusion.    Blood pressure 130/84, pulse 70, temperature 97.4 F (36.3 C), temperature source Temporal, resp. rate 14, height 5\' 7"  (1.702 m), weight 174 lb 9.6 oz (79.198 kg).  Physical Exam Physical Exam  Constitutional: She is oriented to person, place, and time. She appears well-developed and well-nourished. No distress.  HENT:  Head: Normocephalic and atraumatic.  Neck: Neck supple. No JVD present. No tracheal deviation present. No thyromegaly present.  Cardiovascular: Normal rate, regular rhythm, normal heart sounds and intact distal pulses.   No murmur heard. Pulmonary/Chest: Effort normal and breath sounds normal. No respiratory distress. She has no wheezes. She has no rales. She exhibits no tenderness.       Radially oriented scar in the lower outer quadrant of the left breast. Cosmesis, contour, no protection, and symmetry are actually quite good. No infection. No hematoma.  Musculoskeletal: She exhibits no edema and no tenderness.  Lymphadenopathy:    She has no cervical adenopathy.  Neurological: She is alert and oriented to person, place, and time. She exhibits normal muscle tone. Coordination normal.  Skin: Skin is warm. No rash noted. She is not diaphoretic. No erythema. No pallor.  Psychiatric: She has a normal mood and affect. Her  behavior is normal. Judgment and thought content normal.    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  History recent left partial mastectomy with needle localization with findings of invasive cancer present at the anterior margin  Hypertension    Plan    Patient is scheduled for re-excision left breast lumpectomy site, lower outer quadrant with an ellipse of skin this Friday, 08/06/2012.       Edsel Petrin. Dalbert Batman, M.D., [facility] General and Minimally invasive Surgery Breast and Colorectal Surgery Office:   [phone] Pager:   [phone]  08/04/2012, 5:18 PM

## 2012-08-04 NOTE — H&P (Signed)
[name]     MRN: [id number]   Description: 78 year old female  Provider: Adin Hector, MD  Department: [facility]        Diagnoses     Cancer of lower-outer quadrant of female breast   - Primary    174.5        Vitals -      BP Pulse Temp Resp Ht Wt    130/84 70 97.4 F (36.3 C) (Temporal) 14 5\' 7"  (1.702 m) 174 lb 9.6 oz (79.198 kg)    BMI - 27.35 kg/m2                History and Physical     Adin Hector, MD   Patient ID: Haley Love, female   DOB: 09/22/1934, 78 y.o.   MRN: [id number]    Chief Complaint   Patient presents with   .  Pre-op Exam       Re-excision of margins             HPI Haley Love is a 78 y.o. female.   Haley Love is a 78 y.o. female. She wass referred by Dr. 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   This patient is quite healthy for her age. She has had no prior breast problems. Recent recent screening mammograms show a 1 cm spiculated mass in the left breast at the 3:30 position, 5 cm from the nipple. Ultrasound of the left axilla is normal.   Image guided biopsy shows mixed invasive ductal carcinoma and DCIS, ER 100%, PR 39%, proliferation index 7%, HER-2/neu negative.   MRI was performed showing a 9 mm mass in the left breast at the 3:30 position. A solitary finding. The MRI was otherwise negative. Dr. [name], Dr. [name] and I have discussed her imaging studies, pathology slides, and treatment planning . The patient is strongly motivated towards breast conservation surgery. Dr. [name], Dr. [name]  and I decided that we did not think that a sentinel node biopsy would be necessary to direct therapy. Family history reveals breast cancer in a paternal aunt and in the daughter of the same paternal aunt.   On September 4 she underwent left partial mastectomy with needle localization. Pathology showed she had mixed invasive  ductal carcinoma and noninvasive cancer, 1.2 cm, invasive cancer was present at the anterior margin. This was not expected as the specimen mammogram showed the marker clip in the center of the specimen. I have discussed this with the patient and her sister-in-law. I advised conservative re-excision of the anterior margin including an ellipse of skin. This is scheduled for this Friday. She is in the office today for a discussion of his treatment plan.       Past Medical History   Diagnosis  Date   .  Breast cancer     .  Hypertension     .  Contact lens/glasses fitting         Past Surgical History   Procedure  Date   .  No past surgeries     .  Breast surgery  9/13       lt lump       Family History   Problem  Relation  Age of Onset   .  Lung cancer  Mother     .  Throat cancer  Father  Social History History   Substance Use Topics   .  Smoking status:  Never Smoker    .  Smokeless tobacco:  Never Used   .  Alcohol Use:  Yes         1 glass wine week      No Known Allergies    Current Outpatient Prescriptions   Medication  Sig  Dispense  Refill   .  aspirin 81 MG tablet  Take 81 mg by mouth daily.         Marland Kitchen  b complex vitamins tablet  Take 1 tablet by mouth daily.         .  Cholecalciferol (VITAMIN D-3) 1000 UNITS CAPS  Take 1 capsule by mouth at bedtime.         Marland Kitchen  CINNAMON PO  Take 2,000 mg by mouth at bedtime.         .  Coenzyme Q10 (CO Q-10) 200 MG CAPS  Take 1 capsule by mouth at bedtime.         .  diclofenac (VOLTAREN) 75 MG EC tablet  Take 75 mg by mouth 2 (two) times daily.         .  Flaxseed, Linseed, (FLAX SEED OIL) 1000 MG CAPS  Take by mouth.         Marland Kitchen  HYDROcodone-acetaminophen (NORCO/VICODIN) 5-325 MG per tablet  Take 1-2 tablets by mouth every 4 (four) hours as needed for pain.   35 tablet   1   .  Krill Oil 1500 MG CAPS  Take by mouth.         Marland Kitchen  lisinopril-hydrochlorothiazide (PRINZIDE,ZESTORETIC) 10-12.5 MG per tablet  Take 1 tablet by  mouth daily.         .  Magnesium 500 MG CAPS  Take by mouth.         .  Misc Natural Products (NARCOSOFT HERBAL LAX PO)  Take 2 tablets by mouth.         .  Multiple Vitamins-Minerals (MULTIVITAMIN WITH MINERALS) tablet  Take 1 tablet by mouth daily.         .  Red Yeast Rice Extract (RED YEAST RICE PO)  Take 1,000 mg by mouth 2 (two) times daily.         .  vitamin E 400 UNIT capsule  Take 400 Units by mouth daily.            Review of Systems   Constitutional: Negative for fever, chills and unexpected weight change.  HENT: Negative for hearing loss, congestion, sore throat, trouble swallowing and voice change.   Eyes: Negative for visual disturbance.  Respiratory: Negative for cough and wheezing.   Cardiovascular: Negative for chest pain, palpitations and leg swelling.  Gastrointestinal: Negative for nausea, vomiting, abdominal pain, diarrhea, constipation, blood in stool, abdominal distention and anal bleeding.  Genitourinary: Negative for hematuria, vaginal bleeding and difficulty urinating.  Musculoskeletal: Negative for arthralgias.  Skin: Negative for rash and wound.  Neurological: Negative for seizures, syncope and headaches.  Hematological: Negative for adenopathy. Does not bruise/bleed easily.  Psychiatric/Behavioral: Negative for confusion.    Blood pressure 130/84, pulse 70, temperature 97.4 F (36.3 C), temperature source Temporal, resp. rate 14, height 5\' 7"  (1.702 m), weight 174 lb 9.6 oz (79.198 kg).   Physical Exam   Constitutional: She is oriented to person, place, and time. She appears well-developed and well-nourished. No distress.  HENT:   Head: Normocephalic and atraumatic.  Neck:  Neck supple. No JVD present. No tracheal deviation present. No thyromegaly present.  Cardiovascular: Normal rate, regular rhythm, normal heart sounds and intact distal pulses.    No murmur heard. Pulmonary/Chest: Effort normal and breath sounds normal. No respiratory distress. She  has no wheezes. She has no rales. She exhibits no tenderness.       Radially oriented scar in the lower outer quadrant of the left breast. Cosmesis, contour, no protection, and symmetry are actually quite good. No infection. No hematoma.  Musculoskeletal: She exhibits no edema and no tenderness.  Lymphadenopathy:    She has no cervical adenopathy.  Neurological: She is alert and oriented to person, place, and time. She exhibits normal muscle tone. Coordination normal.  Skin: Skin is warm. No rash noted. She is not diaphoretic. No erythema. No pallor.  Psychiatric: She has a normal mood and affect. Her behavior is normal. Judgment and thought content normal.    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   History recent left partial mastectomy with needle localization with findings of invasive cancer present at the anterior margin   Hypertension   Plan Patient is scheduled for re-excision left breast lumpectomy site, lower outer quadrant with an ellipse of skin this Friday, 08/06/2012.       Edsel Petrin. Dalbert Batman, M.D., [facility] General and Minimally invasive Surgery Breast and Colorectal Surgery Office:   [phone] Pager:   [phone]

## 2012-08-04 NOTE — Patient Instructions (Signed)
The incision in your left breast is healing nicely and looks good.  The final pathology report showed a positive margin with cancer cells at the anterior edge.  You are scheduled for surgery this coming Friday to re\re excise that area to get a clean margin.

## 2012-08-06 ENCOUNTER — Encounter (HOSPITAL_BASED_OUTPATIENT_CLINIC_OR_DEPARTMENT_OTHER): Payer: Self-pay | Admitting: Anesthesiology

## 2012-08-06 ENCOUNTER — Encounter (HOSPITAL_BASED_OUTPATIENT_CLINIC_OR_DEPARTMENT_OTHER)
Admission: RE | Disposition: A | Discharge status: Home / Self Care | Payer: Self-pay | Source: Ambulatory Visit | Attending: General Surgery

## 2012-08-06 ENCOUNTER — Ambulatory Visit (HOSPITAL_BASED_OUTPATIENT_CLINIC_OR_DEPARTMENT_OTHER)
Admission: RE | Admit: 2012-08-06 | Discharge: 2012-08-06 | Disposition: A | Discharge status: Home / Self Care | Payer: Medicare Other | Source: Ambulatory Visit | Attending: General Surgery | Admitting: General Surgery

## 2012-08-06 ENCOUNTER — Ambulatory Visit (HOSPITAL_BASED_OUTPATIENT_CLINIC_OR_DEPARTMENT_OTHER): Payer: Medicare Other | Admitting: Anesthesiology

## 2012-08-06 ENCOUNTER — Encounter (HOSPITAL_BASED_OUTPATIENT_CLINIC_OR_DEPARTMENT_OTHER): Payer: Self-pay | Admitting: *Deleted

## 2012-08-06 DIAGNOSIS — C50519 Malignant neoplasm of lower-outer quadrant of unspecified female breast: Secondary | ICD-10-CM | POA: Insufficient documentation

## 2012-08-06 DIAGNOSIS — I10 Essential (primary) hypertension: Secondary | ICD-10-CM | POA: Insufficient documentation

## 2012-08-06 DIAGNOSIS — D0590 Unspecified type of carcinoma in situ of unspecified breast: Secondary | ICD-10-CM | POA: Insufficient documentation

## 2012-08-06 HISTORY — DX: Encounter for fitting and adjustment of spectacles and contact lenses: Z46.0

## 2012-08-06 HISTORY — PX: MASTECTOMY, PARTIAL: SHX709

## 2012-08-06 SURGERY — MASTECTOMY PARTIAL
Anesthesia: General | Site: Breast | Laterality: Left | Wound class: Clean

## 2012-08-06 MED ORDER — OXYCODONE HCL 5 MG PO TABS: 5.0000 mg | ORAL_TABLET | Freq: Once | ORAL | Status: DC | PRN

## 2012-08-06 MED ORDER — HYDROMORPHONE HCL PF 1 MG/ML IJ SOLN
0.2500 mg | INTRAMUSCULAR | Status: DC | PRN
  Administered 2012-08-06: 0.5 mg via INTRAVENOUS

## 2012-08-06 MED ORDER — ACETAMINOPHEN 10 MG/ML IV SOLN
1000.0000 mg | Freq: Once | INTRAVENOUS | Status: AC
  Administered 2012-08-06: 1000 mg via INTRAVENOUS

## 2012-08-06 MED ORDER — BUPIVACAINE-EPINEPHRINE 0.5% -1:200000 IJ SOLN
INTRAMUSCULAR | Status: DC | PRN
  Administered 2012-08-06: 10 mL

## 2012-08-06 MED ORDER — CHLORHEXIDINE GLUCONATE 4 % EX LIQD: 1.0000 "application " | Freq: Once | CUTANEOUS | Status: DC

## 2012-08-06 MED ORDER — METOCLOPRAMIDE HCL 5 MG/ML IJ SOLN: 10.0000 mg | Freq: Once | INTRAMUSCULAR | Status: DC | PRN

## 2012-08-06 MED ORDER — LIDOCAINE HCL (CARDIAC) 20 MG/ML IV SOLN
INTRAVENOUS | Status: DC | PRN
  Administered 2012-08-06: 50 mg via INTRAVENOUS

## 2012-08-06 MED ORDER — FENTANYL CITRATE 0.05 MG/ML IJ SOLN
INTRAMUSCULAR | Status: DC | PRN
  Administered 2012-08-06: 50 ug via INTRAVENOUS

## 2012-08-06 MED ORDER — PROPOFOL 10 MG/ML IV BOLUS
INTRAVENOUS | Status: DC | PRN
  Administered 2012-08-06: 200 mg via INTRAVENOUS

## 2012-08-06 MED ORDER — METOCLOPRAMIDE HCL 5 MG/ML IJ SOLN
INTRAMUSCULAR | Status: DC | PRN
  Administered 2012-08-06: 10 mg via INTRAVENOUS

## 2012-08-06 MED ORDER — OXYCODONE HCL 5 MG/5ML PO SOLN: 5.0000 mg | Freq: Once | ORAL | Status: DC | PRN

## 2012-08-06 MED ORDER — HEPARIN SODIUM (PORCINE) 5000 UNIT/ML IJ SOLN
5000.0000 [IU] | Freq: Once | INTRAMUSCULAR | Status: AC
  Administered 2012-08-06: 5000 [IU] via SUBCUTANEOUS

## 2012-08-06 MED ORDER — ONDANSETRON HCL 4 MG/2ML IJ SOLN
INTRAMUSCULAR | Status: DC | PRN
  Administered 2012-08-06: 4 mg via INTRAVENOUS

## 2012-08-06 MED ORDER — DEXAMETHASONE SODIUM PHOSPHATE 4 MG/ML IJ SOLN
INTRAMUSCULAR | Status: DC | PRN
  Administered 2012-08-06: 10 mg via INTRAVENOUS

## 2012-08-06 MED ORDER — LACTATED RINGERS IV SOLN
INTRAVENOUS | Status: DC
  Administered 2012-08-06 (×2): via INTRAVENOUS

## 2012-08-06 MED ORDER — EPHEDRINE SULFATE 50 MG/ML IJ SOLN
INTRAMUSCULAR | Status: DC | PRN
  Administered 2012-08-06: 15 mg via INTRAVENOUS
  Administered 2012-08-06: 10 mg via INTRAVENOUS

## 2012-08-06 MED ORDER — CEFAZOLIN SODIUM-DEXTROSE 2-3 GM-% IV SOLR
2.0000 g | INTRAVENOUS | Status: AC
  Administered 2012-08-06: 2 g via INTRAVENOUS

## 2012-08-06 SURGICAL SUPPLY — 54 items
APPLIER CLIP 9.375 MED OPEN (MISCELLANEOUS)
BENZOIN TINCTURE PRP APPL 2/3 (GAUZE/BANDAGES/DRESSINGS) IMPLANT
BLADE HEX COATED 2.75 (ELECTRODE) ×2 IMPLANT
BLADE SURG 15 STRL LF DISP TIS (BLADE) ×1 IMPLANT
BLADE SURG 15 STRL SS (BLADE) ×1
CANISTER SUCTION 1200CC (MISCELLANEOUS) ×2 IMPLANT
CHLORAPREP W/TINT 26ML (MISCELLANEOUS) ×2 IMPLANT
CLIP APPLIE 9.375 MED OPEN (MISCELLANEOUS) IMPLANT
COVER MAYO STAND STRL (DRAPES) ×2 IMPLANT
COVER TABLE BACK 60X90 (DRAPES) ×2 IMPLANT
DECANTER SPIKE VIAL GLASS SM (MISCELLANEOUS) IMPLANT
DERMABOND ADVANCED (GAUZE/BANDAGES/DRESSINGS) ×1
DERMABOND ADVANCED .7 DNX12 (GAUZE/BANDAGES/DRESSINGS) ×1 IMPLANT
DEVICE DUBIN W/COMP PLATE 8390 (MISCELLANEOUS) IMPLANT
DRAPE LAPAROSCOPIC ABDOMINAL (DRAPES) IMPLANT
DRAPE PED LAPAROTOMY (DRAPES) ×2 IMPLANT
DRAPE UTILITY XL STRL (DRAPES) ×2 IMPLANT
DRSG EMULSION OIL 3X3 NADH (GAUZE/BANDAGES/DRESSINGS) IMPLANT
ELECT REM PT RETURN 9FT ADLT (ELECTROSURGICAL) ×2
ELECTRODE REM PT RTRN 9FT ADLT (ELECTROSURGICAL) ×1 IMPLANT
GAUZE SPONGE 4X4 12PLY STRL LF (GAUZE/BANDAGES/DRESSINGS) IMPLANT
GAUZE SPONGE 4X4 16PLY XRAY LF (GAUZE/BANDAGES/DRESSINGS) IMPLANT
GLOVE EUDERMIC 7 POWDERFREE (GLOVE) ×2 IMPLANT
GLOVE SKINSENSE NS SZ7.0 (GLOVE) ×1
GLOVE SKINSENSE STRL SZ7.0 (GLOVE) ×1 IMPLANT
GOWN PREVENTION PLUS XLARGE (GOWN DISPOSABLE) ×2 IMPLANT
GOWN PREVENTION PLUS XXLARGE (GOWN DISPOSABLE) ×2 IMPLANT
KIT MARKER MARGIN INK (KITS) ×2 IMPLANT
NEEDLE HYPO 22GX1.5 SAFETY (NEEDLE) IMPLANT
NEEDLE HYPO 25X1 1.5 SAFETY (NEEDLE) ×2 IMPLANT
NS IRRIG 1000ML POUR BTL (IV SOLUTION) ×2 IMPLANT
PACK BASIN DAY SURGERY FS (CUSTOM PROCEDURE TRAY) ×2 IMPLANT
PENCIL BUTTON HOLSTER BLD 10FT (ELECTRODE) ×2 IMPLANT
SLEEVE SCD COMPRESS KNEE MED (MISCELLANEOUS) ×2 IMPLANT
STAPLER VISISTAT 35W (STAPLE) IMPLANT
STRIP CLOSURE SKIN 1/2X4 (GAUZE/BANDAGES/DRESSINGS) IMPLANT
SUT ETHILON 4 0 PS 2 18 (SUTURE) IMPLANT
SUT MON AB 4-0 PC3 18 (SUTURE) ×2 IMPLANT
SUT SILK 2 0 SH (SUTURE) ×2 IMPLANT
SUT VIC AB 2-0 SH 27 (SUTURE)
SUT VIC AB 2-0 SH 27XBRD (SUTURE) IMPLANT
SUT VIC AB 3-0 FS2 27 (SUTURE) IMPLANT
SUT VIC AB 4-0 P-3 18XBRD (SUTURE) IMPLANT
SUT VIC AB 4-0 P3 18 (SUTURE)
SUT VICRYL 3-0 CR8 SH (SUTURE) ×2 IMPLANT
SUT VICRYL 4-0 PS2 18IN ABS (SUTURE) IMPLANT
SYR BULB 3OZ (MISCELLANEOUS) IMPLANT
SYR CONTROL 10ML LL (SYRINGE) ×2 IMPLANT
TAPE HYPAFIX 4 X10 (GAUZE/BANDAGES/DRESSINGS) IMPLANT
TOWEL OR NON WOVEN STRL DISP B (DISPOSABLE) ×2 IMPLANT
TRAY DSU PREP LF (CUSTOM PROCEDURE TRAY) ×2 IMPLANT
TUBE CONNECTING 20X1/4 (TUBING) ×2 IMPLANT
WATER STERILE IRR 1000ML POUR (IV SOLUTION) IMPLANT
YANKAUER SUCT BULB TIP NO VENT (SUCTIONS) ×2 IMPLANT

## 2012-08-06 NOTE — Anesthesia Procedure Notes (Signed)
Procedure Name: LMA Insertion Performed by: Terrance Mass Pre-anesthesia Checklist: Patient identified, Timeout performed, Emergency Drugs available, Suction available and Patient being monitored Patient Re-evaluated:Patient Re-evaluated prior to inductionOxygen Delivery Method: Circle system utilized Preoxygenation: Pre-oxygenation with 100% oxygen Intubation Type: IV induction Ventilation: Mask ventilation without difficulty LMA: LMA inserted LMA Size: 4.0 Number of attempts: 1 Tube secured with: Tape Dental Injury: Teeth and Oropharynx as per pre-operative assessment

## 2012-08-06 NOTE — Interval H&P Note (Signed)
History and Physical Interval Note:  08/06/2012 10:49 AM  Haley Love  has presented today for surgery, with the diagnosis of invasive left breast cancer  The goals and the various methods of treatment have been discussed with the patient and family. After consideration of risks, benefits and other options for treatment, the patient has consented to  Procedure(s) (LRB) with comments: MASTECTOMY PARTIAL (Left) - Left partial mastectomy with re-excision of margins as a surgical intervention .  The patient's history has been reviewed, patient examined, no change in status, stable for surgery.  I have reviewed the patient's chart and labs.  Questions were answered to the patient's satisfaction.     Adin Hector

## 2012-08-06 NOTE — Anesthesia Postprocedure Evaluation (Signed)
Anesthesia Post Note  Patient: Haley Love  Procedure(s) Performed: Procedure(s) (LRB): MASTECTOMY PARTIAL (Left)  Anesthesia type: General  Patient location: PACU  Post pain: Pain level controlled  Post assessment: Patient's Cardiovascular Status Stable  Last Vitals:  Filed Vitals:   08/06/12 1421  BP: 127/44  Pulse: 83  Temp: 36.5 C  Resp: 16    Post vital signs: Reviewed and stable  Level of consciousness: alert  Complications: No apparent anesthesia complications

## 2012-08-06 NOTE — Anesthesia Postprocedure Evaluation (Signed)
  Anesthesia Post-op Note  Patient: Haley Love  Procedure(s) Performed: Procedure(s) (LRB) with comments: MASTECTOMY PARTIAL (Left) - Left partial mastectomy with re-excision of margins  Patient Location: PACU  Anesthesia Type: General  Level of Consciousness: awake, alert  and oriented  Airway and Oxygen Therapy: Patient Spontanous Breathing  Post-op Pain: mild  Post-op Assessment: Post-op Vital signs reviewed, Patient's Cardiovascular Status Stable, Respiratory Function Stable, Patent Airway and No signs of Nausea or vomiting  Post-op Vital Signs: Reviewed and stable  Complications: No apparent anesthesia complications

## 2012-08-06 NOTE — Op Note (Signed)
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 Dalbert Batman, M.D., FACS  Assistant:                      none  Operative Indications:   Haley Love is a 78 y.o. female. 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  This patient is quite healthy for her age and she has had no prior breast problems. Recent recent screening mammograms show a 1 cm spiculated mass in the left breast at the 3:30 position, 5 cm from the nipple. Ultrasound of the left axilla is normal.  Image guided biopsy shows mixed invasive ductal carcinoma and DCIS, ER 100%, PR 39%, proliferation index 7%, HER-2/neu negative.  MRI was performed showing a 9 mm mass in the left breast at the 3:30 position. A solitary finding. The MRI was otherwise negative. Dr. [name], Dr. [name] and I have discussed her imaging studies, pathology slides, and treatment planning . The patient is strongly motivated towards breast conservation surgery. Dr. [name], Dr. [name] and I decided that we did not think that a sentinel node biopsy would be necessary to direct therapy.  Family history reveals breast cancer in a paternal aunt and in the daughter of the same paternal aunt.  On September 4 she underwent left partial mastectomy with needle localization. Pathology showed she had mixed invasive ductal carcinoma and noninvasive cancer, 1.2 cm, invasive cancer was present at the anterior margin. This was not expected as the specimen mammogram showed the marker clip in the center of the specimen. I have discussed  this with the patient and her sister-in-law. I advised conservative re-excision of the anterior margin including an ellipse of skin. She still strongly prefers breast conservation     Operative Findings:       The left breast tissues were healthy showing recent biopsy changes but no sign of infection or cancer  Procedure in Detail:          Following the induction of general endotracheal anesthesia the patient's left breast was prepped and draped in a sterile fashion. Surgical time out was performed. Intravenous antibiotics were given. 0.5% Marcaine with epinephrine was used as a local infiltration anesthetic. I observed the radially oriented incision in the left breast, lower outer quadrant. I drew a ellipse around the incision and then I made a elliptical incision approximately 1.5-2  cm wide excising the old scar. Dissection was carried down into the subcutaneous tissue and then into the breast tissue and widely around this area so as to get a good anterior margin. We did the dissection back into the biopsy site. The specimen was removed and marked with a 6 color ink kit and sent to pathology. Hemostasis was excellent and achieved with electrocautery. Wound  was irrigated with saline. The breast tissues were closed in several layers with interrupted sutures of 3-0 Vicryl and skin closed with a running subcuticular suture of 4-0 Monocryl and Dermabond. Breast binder was placed. The patient was taken to the recovery room in stable condition. There were no complications. EBL 10 cc. Counts correct.     Edsel Petrin. Dalbert Batman, M.D., FACS General and Minimally Invasive Surgery Breast and Colorectal Surgery  08/06/2012 12:08 PM

## 2012-08-06 NOTE — Transfer of Care (Signed)
Immediate Anesthesia Transfer of Care Note  Patient: Haley Love  Procedure(s) Performed: Procedure(s) (LRB) with comments: MASTECTOMY PARTIAL (Left) - Left partial mastectomy with re-excision of margins  Patient Location: PACU  Anesthesia Type: General  Level of Consciousness: awake and alert   Airway & Oxygen Therapy: Patient Spontanous Breathing and Patient connected to face mask oxygen  Post-op Assessment: Report given to PACU RN and Post -op Vital signs reviewed and stable  Post vital signs: Reviewed and stable  Complications: No apparent anesthesia complications

## 2012-08-06 NOTE — Anesthesia Preprocedure Evaluation (Signed)
Anesthesia Evaluation  Patient identified by MRN, date of birth, ID band Patient awake    Reviewed: Allergy & Precautions, H&P , NPO status , Patient's Chart, lab work & pertinent test results, reviewed documented beta blocker date and time   Airway Mallampati: II TM Distance: >3 FB Neck ROM: full    Dental   Pulmonary neg pulmonary ROS,  breath sounds clear to auscultation        Cardiovascular hypertension, On Medications Rhythm:regular     Neuro/Psych negative neurological ROS  negative psych ROS   GI/Hepatic negative GI ROS, Neg liver ROS,   Endo/Other  negative endocrine ROS  Renal/GU negative Renal ROS  negative genitourinary   Musculoskeletal   Abdominal   Peds  Hematology negative hematology ROS (+)   Anesthesia Other Findings See surgeon's H&P   Reproductive/Obstetrics negative OB ROS                           Anesthesia Physical Anesthesia Plan  ASA: II  Anesthesia Plan: General   Post-op Pain Management:    Induction: Intravenous  Airway Management Planned: LMA  Additional Equipment:   Intra-op Plan:   Post-operative Plan: Extubation in OR  Informed Consent: I have reviewed the patients History and Physical, chart, labs and discussed the procedure including the risks, benefits and alternatives for the proposed anesthesia with the patient or authorized representative who has indicated his/her understanding and acceptance.   Dental Advisory Given  Plan Discussed with: CRNA and Surgeon  Anesthesia Plan Comments:         Anesthesia Quick Evaluation

## 2012-08-09 NOTE — Progress Notes (Signed)
Quick Note:  Inform patient of Pathology report,. Please call her tomorrow morning and tell her that there was no residual cancer identified. Tell her this is good news and that she should not need any further surgery. I will discuss this further with her when I see her next time. She may keep her appointments with her medical oncologist and radiation oncologist. ______

## 2012-08-10 ENCOUNTER — Encounter (HOSPITAL_BASED_OUTPATIENT_CLINIC_OR_DEPARTMENT_OTHER): Payer: Self-pay | Admitting: General Surgery

## 2012-08-10 ENCOUNTER — Telehealth (INDEPENDENT_AMBULATORY_CARE_PROVIDER_SITE_OTHER): Payer: Self-pay | Admitting: General Surgery

## 2012-08-10 NOTE — Telephone Encounter (Signed)
Called patient and advised of pathology per Dr. Darrel Hoover request. Patient asked about oncology appointments. Advised of what was in the system as of today, but gave her the contact number to Dr. Virgie Dad office and told her to contact there directly to confirm location and all future appointments with that department. Patient agreed.

## 2012-08-19 ENCOUNTER — Ambulatory Visit: Payer: Self-pay | Admitting: General Practice

## 2012-09-08 ENCOUNTER — Other Ambulatory Visit (HOSPITAL_BASED_OUTPATIENT_CLINIC_OR_DEPARTMENT_OTHER): Payer: Medicare Other | Admitting: Lab

## 2012-09-08 ENCOUNTER — Ambulatory Visit (HOSPITAL_BASED_OUTPATIENT_CLINIC_OR_DEPARTMENT_OTHER): Payer: Medicare Other | Admitting: Oncology

## 2012-09-08 ENCOUNTER — Ambulatory Visit: Payer: PRIVATE HEALTH INSURANCE | Admitting: Oncology

## 2012-09-08 ENCOUNTER — Telehealth: Payer: Self-pay | Admitting: *Deleted

## 2012-09-08 ENCOUNTER — Ambulatory Visit: Payer: Medicare Other | Admitting: Oncology

## 2012-09-08 ENCOUNTER — Other Ambulatory Visit: Payer: PRIVATE HEALTH INSURANCE | Admitting: Lab

## 2012-09-08 VITALS — BP 132/72 | HR 85 | Temp 98.5°F | Resp 20 | Ht 67.0 in | Wt 175.5 lb

## 2012-09-08 DIAGNOSIS — Z170 Estrogen receptor positive status [ER+]: Secondary | ICD-10-CM

## 2012-09-08 DIAGNOSIS — C50519 Malignant neoplasm of lower-outer quadrant of unspecified female breast: Secondary | ICD-10-CM

## 2012-09-08 LAB — CBC WITH DIFFERENTIAL/PLATELET
Eosinophils Absolute: 0.3 10*3/uL (ref 0.0–0.5)
HCT: 42.5 % (ref 34.8–46.6)
HGB: 14.4 g/dL (ref 11.6–15.9)
LYMPH%: 34.7 % (ref 14.0–49.7)
MONO#: 0.7 10*3/uL (ref 0.1–0.9)
NEUT#: 4.2 10*3/uL (ref 1.5–6.5)
NEUT%: 52.9 % (ref 38.4–76.8)
Platelets: 237 10*3/uL (ref 145–400)
WBC: 8 10*3/uL (ref 3.9–10.3)

## 2012-09-08 LAB — COMPREHENSIVE METABOLIC PANEL (CC13)
CO2: 25 mEq/L (ref 22–29)
Calcium: 10.4 mg/dL (ref 8.4–10.4)
Creatinine: 1.2 mg/dL — ABNORMAL HIGH (ref 0.6–1.1)
Glucose: 115 mg/dl — ABNORMAL HIGH (ref 70–99)
Total Bilirubin: 0.5 mg/dL (ref 0.20–1.20)
Total Protein: 7.1 g/dL (ref 6.4–8.3)

## 2012-09-08 MED ORDER — TAMOXIFEN CITRATE 20 MG PO TABS: 20.0000 mg | ORAL_TABLET | Freq: Every day | ORAL | Status: DC

## 2012-09-08 NOTE — Progress Notes (Signed)
Patient ID: Haley Love, female   DOB: 18-Nov-1934, 78 y.o.   MRN: [id number] ID: Haley Love   DOB: 06/19/1934  MR#: [id number]  [id number]  HISTORY OF PRESENT ILLNESS: Haley Love had screening mammography at the [facility] Center 06/17/2012 which showed a possible mass in the left breast. Left diagnostic mammography performed 07/02/2012 showed a persistent 1 cm spiculated mass in the lateral left breast, which was palpable and firm. Ultrasound showed an irregular hypoechoic mass measuring 8 mm. The left axilla was unremarkable.  Biopsy of the mass the same day ([facility] [id number]) showed an invasive ductal carcinoma, grade 1, estrogen receptor 100% progesterone receptor 39% positive, with no HER-2 amplification and an MIB-1 of 7%. Bilateral breast MRI were obtained 07/08/2012 this showed an irregular enhancing mass in the left breast, middle third, measuring 9 mm. There were no other suspicious masses in either breast and no axillary or internal mammary adenopathy.  The patient's subsequent history is as detailed below.  INTERVAL HISTORY: Haley Love returns today for followup of her breast cancer. Since her last visit here she had her definitive surgery, initially with positive margins, but later cleared.  REVIEW OF SYSTEMS: She did terrific with the surgery, with no unusual pain, bleeding, fever, or other complications. She is careful with what she lifts, but otherwise her functional status is back to normal. A detailed review of systems today was entirely noncontributory.  PAST MEDICAL HISTORY: Past Medical History  Diagnosis Date  . Breast cancer   . Hypertension   . Contact lens/glasses fitting     PAST SURGICAL HISTORY: Past Surgical History  Procedure Date  . No past surgeries   . Breast surgery 9/13    lt lump  . Mastectomy, partial 08/06/2012    Procedure: MASTECTOMY PARTIAL;  Surgeon: Adin Hector, MD;  Location: [facility];  Service: General;  Laterality: Left;  Left  partial mastectomy with re-excision of margins    FAMILY HISTORY Family History  Problem Relation Age of Onset  . Lung cancer Mother   . Throat cancer Father    the patient's father died at the age of 23 from cancer of the throat which had been diagnosed 6 years earlier. The patient's mother died at the age of 70. She had cancer of the lung lining diagnosed at age 24. The patient had 4 brothers, no sisters. There is no other cancer history in the immediate family.  GYNECOLOGIC HISTORY: Menarche age 82. Change of life age 79. She took hormone replacement approximately 10 years. She is GX P2, with first live birth at age 49.  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 She is divorced. Her son Haley Love (goes by "J.") also lives in [place] and is self-employed of running a Education administrator business. Daughter Haley Love for L. lives in [place]. She works as a Electrical engineer. The patient has 3 grandchildren. She attends a [employer].  ADVANCED DIRECTIVES: In place  HEALTH MAINTENANCE: History  Substance Use Topics  . Smoking status: Never Smoker   . Smokeless tobacco: Never Used  . Alcohol Use: Yes     1 glass wine week     Colonoscopy: 2012  PAP: 2011  Bone density: 2011  Lipid panel:  No Known Allergies  Current Outpatient Prescriptions  Medication Sig Dispense Refill  . aspirin 81 MG tablet Take 81 mg by mouth daily.      Marland Kitchen b complex vitamins tablet Take 1  tablet by mouth daily.      . Cholecalciferol (VITAMIN D-3) 1000 UNITS CAPS Take 1 capsule by mouth at bedtime.      Marland Kitchen CINNAMON PO Take 2,000 mg by mouth at bedtime.      . Coenzyme Q10 (CO Q-10) 200 MG CAPS Take 1 capsule by mouth at bedtime.      . diclofenac (VOLTAREN) 75 MG EC tablet Take 75 mg by mouth 2 (two) times daily.      . Flaxseed, Linseed, (FLAX SEED OIL) 1000 MG CAPS Take by mouth.      Javier Docker Oil 1500 MG CAPS Take by mouth.      Marland Kitchen  lisinopril-hydrochlorothiazide (PRINZIDE,ZESTORETIC) 10-12.5 MG per tablet Take 1 tablet by mouth daily.      . Magnesium 500 MG CAPS Take by mouth.      . Misc Natural Products (NARCOSOFT HERBAL LAX PO) Take 2 tablets by mouth.      . Multiple Vitamins-Minerals (MULTIVITAMIN WITH MINERALS) tablet Take 1 tablet by mouth daily.      . Red Yeast Rice Extract (RED YEAST RICE PO) Take 1,000 mg by mouth 2 (two) times daily.      . vitamin E 400 UNIT capsule Take 400 Units by mouth daily.        OBJECTIVE: Elderly white woman who appears well Filed Vitals:   09/08/12 1044  BP: 132/72  Pulse: 85  Temp: 98.5 F (36.9 C)  Resp: 20     Body mass index is 27.49 kg/(m^2).    ECOG FS: 0 Sclerae unicteric Oropharynx clear No cervical or supraclavicular adenopathy Lungs no rales or rhonchi Heart regular rate and rhythm Abd benign MSK no focal spinal tenderness, no peripheral edema Neuro: nonfocal Breasts: The right breast is unremarkable. The left breast is status post lumpectomy. The incision is healing nicely, with no dehiscence, subjacent induration, erythema, or swelling.  LAB RESULTS: Lab Results  Component Value Date   WBC 8.0 09/08/2012   NEUTROABS 4.2 09/08/2012   HGB 14.4 09/08/2012   HCT 42.5 09/08/2012   MCV 89.0 09/08/2012   PLT 237 09/08/2012      Chemistry      Component Value Date/Time   NA 141 07/14/2012 1204   K 4.0 07/14/2012 1204   CL 102 07/14/2012 1204   CO2 30 07/14/2012 1204   BUN 19 07/14/2012 1204   CREATININE 1.03 07/14/2012 1204      Component Value Date/Time   CALCIUM 10.2 07/14/2012 1204   ALKPHOS 54 07/14/2012 1204   AST 25 07/14/2012 1204   ALT 29 07/14/2012 1204   BILITOT 0.4 07/14/2012 1204       Lab Results  Component Value Date   LABCA2 35 07/14/2012    No components found with this basename: [id number]    No results found for this basename: INR:1;PROTIME:1 in the last 168 hours  Urinalysis No results found for this basename: colorurine,   appearanceur,  labspec,  phurine,  glucoseu,  hgbur,  bilirubinur,  ketonesur,  proteinur,  urobilinogen,  nitrite,  leukocytesur    STUDIES: US Breast Left  07/02/2012  *RADIOLOGY REPORT*  Clinical Data:  further evaluation of left asymmetry  DIGITAL DIAGNOSTIC LEFT MAMMOGRAM  AND LEFT BREAST ULTRASOUND:  Comparison:  06/17/11, 06/17/12  Findings:  There is a persistent 1cm spiculated mass in the lateral left breast on diagnostic images.  On physical exam, there is a firm palpable 1cm mass in the 3:30 position of the left breast,  5cm from the nipple.  Ultrasound is performed, showing a shadowing, irregular hypoechoic mass in the 3:30 position of the left breast, 5cm from the nipple. It measures 8x6x57mm by ultrasound.  Scanning of the left axilla shows normal appearing lymph nodes of normal size.  IMPRESSION: 1cm left breast mass suspicious for carcinoma.  BI-RADS CATEGORY 5:  Highly suggestive of malignancy - appropriate action should be taken.  RECOMMENDATION: The patient will undergo ultrasound-guided core needle biopsy this morning.  Original Report Authenticated By: Rachael Fee, M.D.   Mr Breast Bilateral W Wo Contrast  07/08/2012  *RADIOLOGY REPORT*  Clinical Data: New diagnosis left sided breast cancer.  BILATERAL BREAST MRI WITH AND WITHOUT CONTRAST  Technique: Multiplanar, multisequence MR images of both breasts were obtained prior to and following the intravenous administration of 80ml of Multihance.  Three dimensional images were evaluated at the independent DynaCad workstation.  Comparison:  None.  Findings: Foci of nonspecific enhancement are seen bilaterally.  An irregular, rapidly enhancing mass is seen in the 3:30 position of the left breast, middle third measuring 0.9 x 0.5 x 0.8 cm with biopsy clip artifact corresponding with the biopsy proven malignancy.  No suspicious mass or enhancement is seen in the right breast.  No axillary or internal mammary adenopathy is present.  IMPRESSION:  Known solitary malignancy, left breast.  No MRI specific evidence of malignancy, right breast.  RECOMMENDATION: Treatment plan, left breast.  THREE-DIMENSIONAL MR IMAGE RENDERING ON INDEPENDENT WORKSTATION:  Three-dimensional MR images were rendered by post-processing of the original MR data on an independent workstation.  The three- dimensional MR images were interpreted, and findings were reported in the accompanying complete MRI report for this study.  BI-RADS CATEGORY 6:  Known biopsy-proven malignancy - appropriate action should be taken.  Original Report Authenticated By: Karis Juba, M.D.   [place] Core Biopsy  07/05/2012  **ADDENDUM** CREATED: 07/05/2012 13:26:56  Histologic evaluation demonstrates grade I invasive ductal carcinoma and ductal carcinoma in situ.  This is concordant with the imaging findings.  Results were discussed with the patient by telephone at her request.  She reports no complications from the procedure.  Breast MRI was scheduled for 07/08/2012.  The patient was scheduled to be seen in the [facility] on 07/14/2012.  **END ADDENDUM** SIGNED BY: Ulyess Blossom, M.D.   07/02/2012  *RADIOLOGY REPORT*  Clinical Data:  suspicious left breast mass  ULTRASOUND GUIDED VACUUM ASSISTED CORE BIOPSY OF THE LEFT BREAST  Comparison: priors  I met with the patient and we discussed the procedure of ultrasound- guided biopsy, including benefits and alternatives.  We discussed the high likelihood of a successful procedure. We discussed the risks of the procedure, including infection, bleeding, tissue injury, clip migration, and inadequate sampling.  Informed, written consent was given.  Using sterile technique, 2% lidocaine, ultrasound guidance, and a 9 gauge vacuum assisted needle, biopsy was performed of the mass in the 3:30 position.  At the conclusion of the procedure, a tissue marker clip was deployed into the biopsy cavity.  Follow-up  2-view mammogram was performed and dictated separately.  IMPRESSION: Ultrasound-guided biopsy of left breast mass.  No apparent complications. Original Report Authenticated By: Chaney Born, M.D.   Greer L  07/02/2012  *RADIOLOGY REPORT*  Clinical Data:  further evaluation of left asymmetry  DIGITAL DIAGNOSTIC LEFT MAMMOGRAM  AND LEFT BREAST ULTRASOUND:  Comparison:  06/17/11, 06/17/12  Findings:  There is a persistent  1cm spiculated mass in the lateral left breast on diagnostic images.  On physical exam, there is a firm palpable 1cm mass in the 3:30 position of the left breast, 5cm from the nipple.  Ultrasound is performed, showing a shadowing, irregular hypoechoic mass in the 3:30 position of the left breast, 5cm from the nipple. It measures 8x6x83mm by ultrasound.  Scanning of the left axilla shows normal appearing lymph nodes of normal size.  IMPRESSION: 1cm left breast mass suspicious for carcinoma.  BI-RADS CATEGORY 5:  Highly suggestive of malignancy - appropriate action should be taken.  RECOMMENDATION: The patient will undergo ultrasound-guided core needle biopsy this morning.  Original Report Authenticated By: Rachael Fee, M.D.   Mm Digital Diagnostic Unilat L  07/02/2012  *RADIOLOGY REPORT*  Clinical Data:  left breast mass  DIGITAL DIAGNOSTIC LEFT MAMMOGRAM  Comparison:  Previous exams.  Findings:  Films are performed following ultrasound guided biopsy of a mass in the 3:30 position of the left breast.  The marker clip projects over the mass on two views.  IMPRESSION: Appropriate clip positioning.  Original Report Authenticated By: Rachael Fee, M.D.    ASSESSMENT: 78 y.o. [name], [place] woman s/p Left lumpectomy 07/28/2012 for a pT1c NX, stage IA invasive ductal carcinoma, grade 1, estrogen receptor 100% and progesterone receptor 39% positive, with no HER-2 amplification and an MIB-1 of 7.   (1) initially positive margins were cleared 08/06/2012  (2) tamoxifen started  October 2013  PLAN: Amera has an excellent prognosis, and I do not believe she will need postlumpectomy radiation. Of I am checking with Dr. Duke Salvia regarding that. She will need anti-estrogen therapy, bolus tube be able to avoid radiation, to lower her risk of recurrence, and to cut in half the risk of her developing a new breast cancer.  We discussed all that in detail today and she also has a good understanding of the possible toxicities, side effects, and complications of tamoxifen versus the aromatase inhibitors. After long discussion she decided she wanted to go with tamoxifen, and I went ahead and wrote her the prescription. She will return to see [place] in 3 months. If she is tolerating tamoxifen well, she will see [place] once a year, after her mammogram, and see Dr. Dalbert Batman 6 months after our visit. She knows to call for any problems that may develop before then.  MAGRINAT,GUSTAV C    09/08/2012

## 2012-09-08 NOTE — Telephone Encounter (Signed)
Gave patient appointment for 12-08-2012 starting at 10:15am

## 2012-09-22 ENCOUNTER — Ambulatory Visit: Payer: Self-pay | Admitting: General Practice

## 2012-11-24 HISTORY — PX: OTHER SURGICAL HISTORY: SHX169

## 2012-12-08 ENCOUNTER — Other Ambulatory Visit: Payer: Medicare Other | Admitting: Lab

## 2012-12-08 ENCOUNTER — Ambulatory Visit: Payer: Medicare Other | Admitting: Physician Assistant

## 2012-12-14 ENCOUNTER — Other Ambulatory Visit (HOSPITAL_BASED_OUTPATIENT_CLINIC_OR_DEPARTMENT_OTHER): Payer: Medicare Other | Admitting: Lab

## 2012-12-14 ENCOUNTER — Encounter: Payer: Self-pay | Admitting: Physician Assistant

## 2012-12-14 ENCOUNTER — Ambulatory Visit (HOSPITAL_BASED_OUTPATIENT_CLINIC_OR_DEPARTMENT_OTHER): Payer: Medicare Other | Admitting: Physician Assistant

## 2012-12-14 ENCOUNTER — Telehealth: Payer: Self-pay | Admitting: Oncology

## 2012-12-14 VITALS — BP 163/88 | HR 83 | Temp 98.2°F | Resp 20 | Ht 67.0 in | Wt 174.4 lb

## 2012-12-14 DIAGNOSIS — L259 Unspecified contact dermatitis, unspecified cause: Secondary | ICD-10-CM

## 2012-12-14 DIAGNOSIS — Z853 Personal history of malignant neoplasm of breast: Secondary | ICD-10-CM

## 2012-12-14 DIAGNOSIS — C50519 Malignant neoplasm of lower-outer quadrant of unspecified female breast: Secondary | ICD-10-CM

## 2012-12-14 DIAGNOSIS — Z170 Estrogen receptor positive status [ER+]: Secondary | ICD-10-CM

## 2012-12-14 DIAGNOSIS — L309 Dermatitis, unspecified: Secondary | ICD-10-CM | POA: Insufficient documentation

## 2012-12-14 LAB — CBC WITH DIFFERENTIAL/PLATELET
Eosinophils Absolute: 0.3 10*3/uL (ref 0.0–0.5)
HCT: 40.5 % (ref 34.8–46.6)
LYMPH%: 35.5 % (ref 14.0–49.7)
MONO#: 0.9 10*3/uL (ref 0.1–0.9)
NEUT#: 6.1 10*3/uL (ref 1.5–6.5)
Platelets: 226 10*3/uL (ref 145–400)
RBC: 4.52 10*6/uL (ref 3.70–5.45)
WBC: 11.4 10*3/uL — ABNORMAL HIGH (ref 3.9–10.3)
lymph#: 4 10*3/uL — ABNORMAL HIGH (ref 0.9–3.3)

## 2012-12-14 MED ORDER — METHYLPREDNISOLONE 4 MG PO KIT: PACK | ORAL | Status: DC

## 2012-12-14 NOTE — Telephone Encounter (Signed)
apts made and printed for pt,ref to dr Dalbert Batman cannot be made until June as they only sch 92mo at a time.  Advised pt to call us if she does not hear from them by June       anne

## 2012-12-14 NOTE — Progress Notes (Signed)
Patient ID: Haley Love, female   DOB: 12/27/1933, 79 y.o.   MRN: [id number] ID: Haley Love   DOB: 22-Jun-1934  MR#: [id number]  [id number]   PCP:  Dr. Deborra Medina,  in [place]    HISTORY OF PRESENT ILLNESS: Haley Love had screening mammography at the [facility] 06/17/2012 which showed a possible mass in the left breast. Left diagnostic mammography performed 07/02/2012 showed a persistent 1 cm spiculated mass in the lateral left breast, which was palpable and firm. Ultrasound showed an irregular hypoechoic mass measuring 8 mm. The left axilla was unremarkable.  Biopsy of the mass the same day ([facility] [id number]) showed an invasive ductal carcinoma, grade 1, estrogen receptor 100% progesterone receptor 39% positive, with no HER-2 amplification and an MIB-1 of 7%. Bilateral breast MRI were obtained 07/08/2012 this showed an irregular enhancing mass in the left breast, middle third, measuring 9 mm. There were no other suspicious masses in either breast and no axillary or internal mammary adenopathy.  The patient's subsequent history is as detailed below.  INTERVAL HISTORY: Haley Love returns today for followup of her left breast cancer. Since her last appointment here, Haley Love started on tamoxifen in October. She's taking 20 mg daily and appears to be tolerating the medication well. She's had no problems with hot flashes. She has had no abnormal bleeding, swelling, or signs of abnormal clotting. She denies vaginal bleeding, discharge, or dryness. She status post cataract surgery in the left eye 2 weeks ago, and plans to have the right eye done next week.   Interval history is remarkable for a worsening rash, spread throughout the body on the trunk and the extremities. It itches. Haley Love has not been able to figure out what has caused the rash, and tells me she was diagnosed with "neuro dermatitis". After much discussion, I will note that she believes the rash started in August, and this is around the  same time she started on diclofenac for sciatica.  REVIEW OF SYSTEMS: Haley Love has had no recent illnesses and denies fevers or chills. She's eating and drinking well denies nausea or change in bowel or bladder habits. She's had no cough, phlegm production, pleurisy, or shortness of breath. She denies any unusual myalgias, arthralgias, bony pain, or peripheral swelling.  A detailed review of systems is otherwise stable and noncontributory.   PAST MEDICAL HISTORY: Past Medical History  Diagnosis Date  . Breast cancer   . Hypertension   . Contact lens/glasses fitting     PAST SURGICAL HISTORY: Past Surgical History  Procedure Date  . No past surgeries   . Breast surgery 9/13    lt lump  . Mastectomy, partial 08/06/2012    Procedure: MASTECTOMY PARTIAL;  Surgeon: Adin Hector, MD;  Location: [facility];  Service: General;  Laterality: Left;  Left partial mastectomy with re-excision of margins    FAMILY HISTORY Family History  Problem Relation Age of Onset  . Lung cancer Mother   . Throat cancer Father    the patient's father died at the age of 37 from cancer of the throat which had been diagnosed 6 years earlier. The patient's mother died at the age of 39. She had cancer of the lung lining diagnosed at age 22. The patient had 4 brothers, no sisters. There is no other cancer history in the immediate family.  GYNECOLOGIC HISTORY: Menarche age 6. Change of life age 69. She took hormone replacement approximately 10 years. She is GX P2, with first live birth  at age 40.  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 She is divorced. Her son Haley Love (goes by "J.") also lives in [place] and is self-employed of running a Education administrator business. Daughter Haley Love for L. lives in [place]. She works as a Electrical engineer. The patient has 3 grandchildren. She attends a [employer].  ADVANCED DIRECTIVES: In place  HEALTH  MAINTENANCE: History  Substance Use Topics  . Smoking status: Never Smoker   . Smokeless tobacco: Never Used  . Alcohol Use: Yes     Comment: 1 glass wine week     Colonoscopy: 2012  PAP: 2011  Bone density: 2011  Lipid panel:  No Known Allergies  Current Outpatient Prescriptions  Medication Sig Dispense Refill  . aspirin 81 MG tablet Take 81 mg by mouth daily.      Marland Kitchen b complex vitamins tablet Take 1 tablet by mouth daily.      Haley Love, Borago officinalis, (BORAGE OIL PO) Take 1,000 mg by mouth daily.      . Cholecalciferol (VITAMIN D-3) 1000 UNITS CAPS Take 1 capsule by mouth at bedtime.      Marland Kitchen CINNAMON PO Take 2,000 mg by mouth at bedtime.      . Coenzyme Q10 (CO Q-10) 200 MG CAPS Take 1 capsule by mouth at bedtime.      . diclofenac (VOLTAREN) 75 MG EC tablet Take 75 mg by mouth 2 (two) times daily.      . Flaxseed, Linseed, (FLAX SEED OIL) 1000 MG CAPS Take by mouth.      Javier Docker Oil 1500 MG CAPS Take by mouth.      Marland Kitchen lisinopril-hydrochlorothiazide (PRINZIDE,ZESTORETIC) 10-12.5 MG per tablet Take 1 tablet by mouth daily.      . Magnesium 500 MG CAPS Take by mouth.      . Misc Natural Products (NARCOSOFT HERBAL LAX PO) Take 2 tablets by mouth.      . Multiple Vitamins-Minerals (MULTIVITAMIN WITH MINERALS) tablet Take 1 tablet by mouth daily.      . Red Yeast Rice Extract (RED YEAST RICE PO) Take 1,000 mg by mouth 2 (two) times daily.      . tamoxifen (NOLVADEX) 20 MG tablet Take 1 tablet (20 mg total) by mouth daily.  90 tablet  12  . triamcinolone cream (KENALOG) 0.1 % Apply 1 application topically 2 (two) times daily.      . vitamin E 400 UNIT capsule Take 400 Units by mouth daily.      . methylPREDNISolone (MEDROL, PAK,) 4 MG tablet follow package directions  21 tablet  0    OBJECTIVE: Elderly white woman who appears well, but slightly uncomfortable Filed Vitals:   12/14/12 1022  BP: 163/88  Pulse: 83  Temp: 98.2 F (36.8 C)  Resp: 20     Body mass index is 27.31  kg/(m^2).    ECOG FS: 1 Filed Weights   12/14/12 1022  Weight: 174 lb 6.4 oz (79.107 kg)   Sclerae unicteric Oropharynx clear No cervical or supraclavicular adenopathy Lungs no rales or rhonchi Heart regular rate and rhythm Abdomen is soft, nontender, with positive bowel sounds MSK no focal spinal tenderness, no peripheral edema Neuro: nonfocal, well oriented Breasts: The right breast is unremarkable. The left breast is status post lumpectomy with well-healed incision and no evidence of local recurrence. Axillae are benign bilaterally Skin: There is a diffuse erythematous maculopapular rash affecting the trunk, and all  4 extremities. There no vesicles or frank pustules noted.  LAB RESULTS: Lab Results  Component Value Date   WBC 11.4* 12/14/2012   NEUTROABS 6.1 12/14/2012   HGB 13.6 12/14/2012   HCT 40.5 12/14/2012   MCV 89.5 12/14/2012   PLT 226 12/14/2012      Chemistry      Component Value Date/Time   NA 139 09/08/2012 1002   NA 141 07/14/2012 1204   K 4.7 09/08/2012 1002   K 4.0 07/14/2012 1204   CL 105 09/08/2012 1002   CL 102 07/14/2012 1204   CO2 25 09/08/2012 1002   CO2 30 07/14/2012 1204   BUN 28.0* 09/08/2012 1002   BUN 19 07/14/2012 1204   CREATININE 1.2* 09/08/2012 1002   CREATININE 1.03 07/14/2012 1204      Component Value Date/Time   CALCIUM 10.4 09/08/2012 1002   CALCIUM 10.2 07/14/2012 1204   ALKPHOS 60 09/08/2012 1002   ALKPHOS 54 07/14/2012 1204   AST 31 09/08/2012 1002   AST 25 07/14/2012 1204   ALT 41 09/08/2012 1002   ALT 29 07/14/2012 1204   BILITOT 0.50 09/08/2012 1002   BILITOT 0.4 07/14/2012 1204       Lab Results  Component Value Date   LABCA2 35 07/14/2012     STUDIES:  No results found.    ASSESSMENT: 79 y.o. [name], [place] woman s/p Left lumpectomy 07/28/2012 for a pT1c NX, stage IA invasive ductal carcinoma, grade 1, estrogen receptor 100% and progesterone receptor 39% positive, with no HER-2 amplification and an MIB-1 of 7.   (1)  initially positive margins were cleared 08/06/2012  (2) tamoxifen started October 2013  (3)  Dermatitis   PLAN:  With regards to her breast cancer, Wyndi appears to be doing very well, and is tolerating the tamoxifen well. She will continue on 20 mg daily.  I'm curious if the diclofenac could be a culprit in her pruritic rash. She tells me she started that medication in August, around the same time she noticed the itching, and it has gradually gotten worse, now accompanied by a widespread rash.  (I will mention that the itching and rash started approximately 2 months prior to initiating tamoxifen, so I do not think that is the problem.)   I suggested that she hold the diclofenac for the next week or 2, and I have prescribed a Medrol Dosepak to see if this "cools down" the rash. Fortunately, she scheduled to see her primary care physician next week, and return to her dermatologist the following week so that they can further assess this apparent dermatitis.  Otherwise, Britaney will be scheduled for her next mammogram in late July after which she will see Dr. Dalbert Batman for routine followup. We will see her in one year, and we will continue to alternate visits with Dr. Dalbert Batman for the next 5 years. During voices understanding and agreement with our plan, and will call with any changes or problems.    Castiel Lauricella    12/14/2012

## 2012-12-21 ENCOUNTER — Encounter: Payer: Self-pay | Admitting: Internal Medicine

## 2012-12-21 ENCOUNTER — Ambulatory Visit (INDEPENDENT_AMBULATORY_CARE_PROVIDER_SITE_OTHER): Payer: Medicare Other | Admitting: Internal Medicine

## 2012-12-21 VITALS — BP 128/68 | HR 88 | Temp 98.0°F | Resp 16 | Ht 67.5 in | Wt 173.5 lb

## 2012-12-21 DIAGNOSIS — L259 Unspecified contact dermatitis, unspecified cause: Secondary | ICD-10-CM

## 2012-12-21 DIAGNOSIS — E785 Hyperlipidemia, unspecified: Secondary | ICD-10-CM

## 2012-12-21 DIAGNOSIS — Z124 Encounter for screening for malignant neoplasm of cervix: Secondary | ICD-10-CM

## 2012-12-21 DIAGNOSIS — L299 Pruritus, unspecified: Secondary | ICD-10-CM

## 2012-12-21 DIAGNOSIS — E559 Vitamin D deficiency, unspecified: Secondary | ICD-10-CM

## 2012-12-21 DIAGNOSIS — L309 Dermatitis, unspecified: Secondary | ICD-10-CM

## 2012-12-21 DIAGNOSIS — K635 Polyp of colon: Secondary | ICD-10-CM

## 2012-12-21 DIAGNOSIS — R7309 Other abnormal glucose: Secondary | ICD-10-CM

## 2012-12-21 DIAGNOSIS — D126 Benign neoplasm of colon, unspecified: Secondary | ICD-10-CM | POA: Insufficient documentation

## 2012-12-21 DIAGNOSIS — R739 Hyperglycemia, unspecified: Secondary | ICD-10-CM

## 2012-12-21 DIAGNOSIS — R21 Rash and other nonspecific skin eruption: Secondary | ICD-10-CM

## 2012-12-21 MED ORDER — CEPHALEXIN 500 MG PO CAPS: 500.0000 mg | ORAL_CAPSULE | Freq: Four times a day (QID) | ORAL | Status: DC

## 2012-12-21 MED ORDER — HYDROXYZINE HCL 25 MG PO TABS: 25.0000 mg | ORAL_TABLET | Freq: Three times a day (TID) | ORAL | Status: DC | PRN

## 2012-12-21 MED ORDER — ALPRAZOLAM 0.25 MG PO TABS
0.2500 mg | ORAL_TABLET | Freq: Two times a day (BID) | ORAL | Status: DC | PRN
Start: 2012-12-21 — End: 2013-07-22

## 2012-12-21 MED ORDER — TRAMADOL HCL 50 MG PO TABS
50.0000 mg | ORAL_TABLET | Freq: Three times a day (TID) | ORAL | Status: DC | PRN
Start: 2012-12-21 — End: 2013-06-21

## 2012-12-21 NOTE — Progress Notes (Signed)
Patient ID: Haley Love, female   DOB: [birth year], 79 y.o.   MRN: [id number]     Patient Active Problem List  Diagnosis  . Cancer of lower-outer quadrant of female breast  . Dermatitis  . Colon polyps  . Screening for cervical cancer    Subjective:  CC:   Chief Complaint  Patient presents with  . Establish Care    HPI:   Haley Love is a 79 y.o. female who presents as a new patient to establish primary care with the chief complaint of Itching .  The itching has bben present for several months.  It is accompanied by red welts.  It started on her back and hips and has spread to chest arms and legs.  It has spared her face and neck until recently but she now is having itchin og her neck.  She has seen a dermatologist and was diagnosed with neurodermatitis by Dr Koleen Nimrod, but no biopsy was done. She has been using a steroid cream prescribed by her dermatologist (triamcnolone 0.1 % ) for several weeks with no change in symptoms and continued spread.  At a recnet oncology follow up for breast cancer, her oncologist's PA wanted to prescribe steroids but patient deferred.  On her own she has tried an OTC nutrapharmacueitcal for parasites despite no known exposure (no contagt with children or homeless people,  No recent travel or camping, no stays in a hotel) with out imporvement.  She uses a retinol soap from a health food store on her face and has done so for several years and Uses Dial or Lever 2000 on her body.  She wa srecenlty started on diclofenac for hip pain secondary to bursitis , several weeks prior to the onset of symptoms     Past Medical History  Diagnosis Date  . Breast cancer   . Hypertension   . Contact lens/glasses fitting   . Hyperlipidemia   . Inflammatory polyps of colon   . Cataract     Past Surgical History  Procedure Date  . No past surgeries   . Breast surgery 9/13    lt lump  . Mastectomy, partial 08/06/2012    Procedure: MASTECTOMY PARTIAL;  Surgeon:  Adin Hector, MD;  Location: [facility];  Service: General;  Laterality: Left;  Left partial mastectomy with re-excision of margins  . Cataracts 2014  . Knee arthroscopy w/ meniscal repair 09/23/11    Right    Family History  Problem Relation Age of Onset  . Lung cancer Mother   . Arthritis Mother   . Hyperlipidemia Mother   . Hypertension Mother   . Throat cancer Father   . Hyperlipidemia Father   . Cancer Paternal Aunt     breast    History   Social History  . Marital Status: Divorced    Spouse Name: N/A    Number of Children: N/A  . Years of Education: N/A   Occupational History  . Not on file.   Social History Main Topics  . Smoking status: Never Smoker   . Smokeless tobacco: Never Used  . Alcohol Use: Yes     Comment: 1 glass wine week  . Drug Use: No  . Sexually Active: Not on file   Other Topics Concern  . Not on file   Social History Narrative   Lives alone ,       No Known Allergies   Review of Systems:   The remainder of the  review of systems was negative except those addressed in the HPI.       Objective:  BP 128/68  Pulse 88  Temp 98 F (36.7 C) (Oral)  Resp 16  Ht 5' 7.5" (1.715 m)  Wt 173 lb 8 oz (78.699 kg)  BMI 26.77 kg/m2  SpO2 96%  General appearance: alert, cooperative and appears stated age Ears: normal TM's and external ear canals both ears Throat: lips, mucosa, and tongue normal; teeth and gums normal Neck: no adenopathy, no carotid bruit, supple, symmetrical, trachea midline and thyroid not enlarged, symmetric, no tenderness/mass/nodules Back: symmetric, no curvature. ROM normal. No CVA tenderness. Lungs: clear to auscultation bilaterally Heart: regular rate and rhythm, S1, S2 normal, no murmur, click, rub or gallop Abdomen: soft, non-tender; bowel sounds normal; no masses,  no organomegaly Pulses: 2+ and symmetric Skin: Skin color, texture, turgor normal. No rashes or lesions Lymph nodes: Cervical,  supraclavicular, and axillary nodes normal.  Assessment and Plan:  Dermatitis She is quite miserable and has such a diffuse rash which is not limited to places that she can reaach that I am concerned about something systemic going on including drug reaaction, Sweet's syndrome, vasculitis and concurrent cellulitis. Will stop the new medication, diclofenac.  No systemic steroids or use of steroid cream on any new lesions until punch biopsy can be done at her follow up next month with Dr. Koleen Nimrod.  Given the pustular nature of several lesions,  Will cover for cellulitis with oral keflex,  Atarax for itching.   Hyperlipidemia LDL goal < 100 Managed with [facility] per patient preference.  Return for fasting labs and CMET   Updated Medication List Outpatient Encounter Prescriptions as of 12/21/2012  Medication Sig Dispense Refill  . aspirin 81 MG tablet Take 81 mg by mouth daily.      Marland Kitchen b complex vitamins tablet Take 1 tablet by mouth daily.      Chong Sicilian, Borago officinalis, (BORAGE OIL PO) Take 1,000 mg by mouth daily.      . Cholecalciferol (VITAMIN D-3) 1000 UNITS CAPS Take 1 capsule by mouth at bedtime.      Marland Kitchen CINNAMON PO Take 2,000 mg by mouth at bedtime.      . Coenzyme Q10 (CO Q-10) 200 MG CAPS Take 1 capsule by mouth at bedtime.      . Flaxseed, Linseed, (FLAX SEED OIL) 1000 MG CAPS Take by mouth.      Marland Kitchen ketorolac (ACULAR) 0.4 % SOLN       . Krill Oil 1500 MG CAPS Take by mouth.      Marland Kitchen lisinopril-hydrochlorothiazide (PRINZIDE,ZESTORETIC) 10-12.5 MG per tablet Take 1 tablet by mouth daily.      . Magnesium 500 MG CAPS Take by mouth.      . Misc Natural Products (NARCOSOFT HERBAL LAX PO) Take 2 tablets by mouth.      . Multiple Vitamins-Minerals (MULTIVITAMIN WITH MINERALS) tablet Take 1 tablet by mouth daily.      Marland Kitchen ofloxacin (OCUFLOX) 0.3 % ophthalmic solution       . prednisoLONE acetate (PRED FORTE) 1 % ophthalmic suspension       . Red Yeast Rice Extract (RED YEAST RICE PO) Take 1,000  mg by mouth 2 (two) times daily.      . tamoxifen (NOLVADEX) 20 MG tablet Take 1 tablet (20 mg total) by mouth daily.  90 tablet  12  . triamcinolone cream (KENALOG) 0.1 % Apply 1 application topically 2 (two) times daily.      Marland Kitchen  vitamin E 400 UNIT capsule Take 400 Units by mouth daily.      . [DISCONTINUED] diclofenac (VOLTAREN) 75 MG EC tablet Take 75 mg by mouth 2 (two) times daily.      Marland Kitchen ALPRAZolam (XANAX) 0.25 MG tablet Take 1 tablet (0.25 mg total) by mouth 2 (two) times daily as needed for sleep or anxiety.  60 tablet  0  . cephALEXin (KEFLEX) 500 MG capsule Take 1 capsule (500 mg total) by mouth 4 (four) times daily.  21 capsule  0  . hydrOXYzine (ATARAX/VISTARIL) 25 MG tablet Take 1 tablet (25 mg total) by mouth 3 (three) times daily as needed for itching.  90 tablet  1  . traMADol (ULTRAM) 50 MG tablet Take 1 tablet (50 mg total) by mouth every 8 (eight) hours as needed for pain.  90 tablet  1  . [DISCONTINUED] methylPREDNISolone (MEDROL, PAK,) 4 MG tablet follow package directions  21 tablet  0

## 2012-12-21 NOTE — Patient Instructions (Addendum)
Please change to a hypoallergenic soap on your body. (Almay, etc.)   I am prescribing an anti itch  medication (hydroxyzine ) and an antibioitic(keflex)  to see if the itching resolves.  Use Eucerin moisturizer after showering.    Keep your appt with Dr. Koleen Nimrod  .   Try to keep the neck untainted by steroid cream or retinol so Dr Koleen Nimrod can do a skin biopsy.  Stop the steroid cream since  it is not helping   Stop the diclofenac .. I will prescribe tramadol for pain   Please return for fasting labs (please drink water during the 8 hr  fast so your kidney function does not reflect dehydration)  Save the alprazolam for anxiety

## 2012-12-22 ENCOUNTER — Encounter: Payer: Self-pay | Admitting: Internal Medicine

## 2012-12-22 DIAGNOSIS — E785 Hyperlipidemia, unspecified: Secondary | ICD-10-CM | POA: Insufficient documentation

## 2012-12-22 NOTE — Assessment & Plan Note (Signed)
She is quite miserable and has such a diffuse rash which is not limited to places that she can reaach that I am concerned about something systemic going on including drug reaaction, Sweet's syndrome, vasculitis and concurrent cellulitis. Will stop the new medication, diclofenac.  No systemic steroids or use of steroid cream on any new lesions until punch biopsy can be done at her follow up next month with Dr. Koleen Nimrod.  Given the pustular nature of several lesions,  Will cover for cellulitis with oral keflex,  Atarax for itching.

## 2012-12-22 NOTE — Assessment & Plan Note (Signed)
Managed with [facility] per patient preference.  Return for fasting labs and CMET

## 2012-12-23 ENCOUNTER — Telehealth: Payer: Self-pay | Admitting: *Deleted

## 2012-12-23 NOTE — Telephone Encounter (Signed)
This RN spoke with pt today per request by AB/PA to follow up on rash.  Pt states " I did not start the steroid because I was already using a steroid cream without benefit and I knew I was going to my primary MD ".  Haley Love states primary MD put her on an abx- and a " a medicine that makes me so drowsy when I take it ".  Pt's visit to primary care is documented in EPIC.  Above will be relayed to AB/PA.

## 2012-12-30 ENCOUNTER — Other Ambulatory Visit (INDEPENDENT_AMBULATORY_CARE_PROVIDER_SITE_OTHER): Payer: Medicare Other

## 2012-12-30 DIAGNOSIS — R21 Rash and other nonspecific skin eruption: Secondary | ICD-10-CM

## 2012-12-30 DIAGNOSIS — R739 Hyperglycemia, unspecified: Secondary | ICD-10-CM

## 2012-12-30 DIAGNOSIS — E559 Vitamin D deficiency, unspecified: Secondary | ICD-10-CM

## 2012-12-30 DIAGNOSIS — R7309 Other abnormal glucose: Secondary | ICD-10-CM

## 2012-12-30 DIAGNOSIS — E785 Hyperlipidemia, unspecified: Secondary | ICD-10-CM

## 2012-12-30 DIAGNOSIS — L299 Pruritus, unspecified: Secondary | ICD-10-CM

## 2012-12-30 LAB — LIPID PANEL
Cholesterol: 215 mg/dL — ABNORMAL HIGH (ref 0–200)
HDL: 39.3 mg/dL (ref >39.00)
Total CHOL/HDL Ratio: 5
Triglycerides: 250 mg/dL — ABNORMAL HIGH (ref 0.0–149.0)

## 2012-12-30 LAB — COMPREHENSIVE METABOLIC PANEL
ALT: 33 U/L (ref 0–35)
AST: 25 U/L (ref 0–37)
BUN: 23 mg/dL (ref 6–23)
Calcium: 9.1 mg/dL (ref 8.4–10.5)
Creatinine, Ser: 1.1 mg/dL (ref 0.4–1.2)
GFR: 50.26 mL/min — ABNORMAL LOW (ref >60.00)
Total Bilirubin: 0.7 mg/dL (ref 0.3–1.2)

## 2012-12-30 LAB — HEMOGLOBIN A1C: Hgb A1c MFr Bld: 6.3 % (ref 4.6–6.5)

## 2013-01-19 ENCOUNTER — Telehealth: Payer: Self-pay | Admitting: *Deleted

## 2013-01-19 NOTE — Telephone Encounter (Signed)
Received call from patient stating she has a rash all over her body.  She went to the dermatologist and they biopsied it and told her it was drug related.  She stopped all of her vitamins and now is only taking lisinipril which she has been taking for years and tamoxifen which she has been on since October.  She has used everything she knows to use and even prescription meds to stop the itch.   Informed her she could stop the tamoxifen for a couple of weeks to see if it gets better. Also encourage her to try Sarna cream to see if she can get some relief. She will call us back and let us know how she is doing.

## 2013-02-07 ENCOUNTER — Telehealth: Payer: Self-pay | Admitting: *Deleted

## 2013-02-08 ENCOUNTER — Other Ambulatory Visit: Payer: Self-pay | Admitting: *Deleted

## 2013-02-08 MED ORDER — METHYLPREDNISOLONE 4 MG PO KIT: PACK | ORAL | Status: DC

## 2013-02-08 NOTE — Telephone Encounter (Signed)
Per review of ongoing rash with AB/PA prescription for medrol dose pak escribed to pharmacy.  Pt contacted and  Discussed use of medrol dose pak and side effects-  Dalanie did ask if she should start back on the Tamoxifen- this RN informed pt not at this time but requested a return call next with update on rash for possible resuming of medication.

## 2013-02-15 ENCOUNTER — Telehealth: Payer: Self-pay | Admitting: *Deleted

## 2013-02-15 NOTE — Telephone Encounter (Signed)
Received call from patient stating she finished the prednisone pak and her rash is still just as bad as it was.  Informed her that at this point we don't know what else to do for her.  Instructed her to go back to her dermatologist to have this reevaluated.  She states she already had made an appt. For tomorrow.  Informed her to let us know what she finds out.  She verbalized understanding.

## 2013-03-16 ENCOUNTER — Telehealth: Payer: Self-pay | Admitting: *Deleted

## 2013-03-16 NOTE — Telephone Encounter (Signed)
Received call from patient stating she has been aching in her hips and back since starting back on the tamoxifen.  She states sometimes it "feels like a knife stuck in her back".  She stopped the tamoxifen about 2 weeks ago and states she is feeling some better, but just cannot continue on like this.  She was recently diagnosed with scabies after 6 months of misdiagnosis and is healing.  She will be going out of town for a couple weeks next week and would like to see Dr. [name] since she has had so many problems lately.  Confirmed appt. For 03/17/13 at 1030.

## 2013-03-17 ENCOUNTER — Ambulatory Visit (HOSPITAL_COMMUNITY)
Admission: RE | Admit: 2013-03-17 | Discharge: 2013-03-17 | Disposition: A | Discharge status: Home / Self Care | Payer: Medicare Other | Source: Ambulatory Visit | Attending: Oncology | Admitting: Oncology

## 2013-03-17 ENCOUNTER — Ambulatory Visit (HOSPITAL_BASED_OUTPATIENT_CLINIC_OR_DEPARTMENT_OTHER): Payer: Medicare Other | Admitting: Oncology

## 2013-03-17 ENCOUNTER — Telehealth: Payer: Self-pay | Admitting: *Deleted

## 2013-03-17 ENCOUNTER — Ambulatory Visit: Payer: Medicare Other | Admitting: Family

## 2013-03-17 VITALS — BP 157/75 | HR 92 | Temp 97.8°F | Resp 20 | Ht 67.0 in | Wt 173.8 lb

## 2013-03-17 DIAGNOSIS — C50519 Malignant neoplasm of lower-outer quadrant of unspecified female breast: Secondary | ICD-10-CM

## 2013-03-17 DIAGNOSIS — M4180 Other forms of scoliosis, site unspecified: Secondary | ICD-10-CM | POA: Insufficient documentation

## 2013-03-17 DIAGNOSIS — M549 Dorsalgia, unspecified: Secondary | ICD-10-CM

## 2013-03-17 DIAGNOSIS — Z170 Estrogen receptor positive status [ER+]: Secondary | ICD-10-CM | POA: Insufficient documentation

## 2013-03-17 DIAGNOSIS — Z79899 Other long term (current) drug therapy: Secondary | ICD-10-CM | POA: Insufficient documentation

## 2013-03-17 DIAGNOSIS — M25559 Pain in unspecified hip: Secondary | ICD-10-CM

## 2013-03-17 MED ORDER — ANASTROZOLE 1 MG PO TABS: 1.0000 mg | ORAL_TABLET | Freq: Every day | ORAL | Status: DC

## 2013-03-17 NOTE — Telephone Encounter (Signed)
appts made and printed...td 

## 2013-03-17 NOTE — Progress Notes (Signed)
Patient ID: Haley Love, female   DOB: July 17, 1934, 79 y.o.   MRN: [id number] ID: Haley Love   DOB: 02-19-1934  MR#: [id number]  [id number]   PCP:  Dr. Deborra Medina, [place] in [place]  HISTORY OF PRESENT ILLNESS: Haley Love had screening mammography at the [facility] 06/17/2012 which showed a possible mass in the left breast. Left diagnostic mammography performed 07/02/2012 showed a persistent 1 cm spiculated mass in the lateral left breast, which was palpable and firm. Ultrasound showed an irregular hypoechoic mass measuring 8 mm. The left axilla was unremarkable.  Biopsy of the mass the same day (SAA [id number]) showed an invasive ductal carcinoma, grade 1, estrogen receptor 100% progesterone receptor 39% positive, with no HER-2 amplification and an MIB-1 of 7%. Bilateral breast MRI were obtained 07/08/2012 this showed an irregular enhancing mass in the left breast, middle third, measuring 9 mm. There were no other suspicious masses in either breast and no axillary or internal mammary adenopathy.  The patient's subsequent history is as detailed below.  INTERVAL HISTORY: Haley Love returns today for followup of her breast cancer. After going to 2 dermatologists it was finally found that her rash was due to scabies. She was given the upper. Standard treatment and that is now just about resolved. In the meantime though she's developed leg cramps, and lower back pain. Sometimes she feels she can barely walk. She thinks it tamoxifen may be the cause of this, so she stopped that medication 2 weeks ago. The hip and leg pain continues although she says it is better.  REVIEW OF SYSTEMS: She has not been able to sleep well because of the itching, but that is better. Despite the pain in the hips and legs she is helping care for a friend who had knee surgery. She wants to be more active but "I just can't right". She has had no fevers, leading, unexplained weight loss  or fatigue or other systemic symptoms of  concern. A detailed review of systems was otherwise stable.  PAST MEDICAL HISTORY: Past Medical History  Diagnosis Date  . Breast cancer   . Hypertension   . Contact lens/glasses fitting   . Hyperlipidemia   . Inflammatory polyps of colon   . Cataract     PAST SURGICAL HISTORY: Past Surgical History  Procedure Laterality Date  . No past surgeries    . Breast surgery  9/13    lt lump  . Mastectomy, partial  08/06/2012    Procedure: MASTECTOMY PARTIAL;  Surgeon: Adin Hector, MD;  Location: [facility];  Service: General;  Laterality: Left;  Left partial mastectomy with re-excision of margins  . Cataracts  2014  . Knee arthroscopy w/ meniscal repair  09/23/11    Right    FAMILY HISTORY Family History  Problem Relation Age of Onset  . Lung cancer Mother   . Arthritis Mother   . Hyperlipidemia Mother   . Hypertension Mother   . Throat cancer Father   . Hyperlipidemia Father   . Cancer Paternal Aunt     breast   the patient's father died at the age of 25 from cancer of the throat which had been diagnosed 6 years earlier. The patient's mother died at the age of 72. She had cancer of the lung lining diagnosed at age 86. The patient had 4 brothers, no sisters. There is no other cancer history in the immediate family.  GYNECOLOGIC HISTORY: Menarche age 57. Change of life age 28. She took  hormone replacement approximately 10 years. She is GX P2, with first live birth at age 59.  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 She is divorced. Her son Haley Love (goes by "J.") also lives in [place] and is self-employed of running a Education administrator business. Daughter Haley Love for L. lives in [place]. She works as a Electrical engineer. The patient has 3 grandchildren. She attends a [employer].  ADVANCED DIRECTIVES: In place  HEALTH MAINTENANCE: History  Substance Use Topics  . Smoking status: Never Smoker   .  Smokeless tobacco: Never Used  . Alcohol Use: Yes     Comment: 1 glass wine week     Colonoscopy: 2012  PAP: 2011  Bone density: 2011  Lipid panel:  No Known Allergies  Current Outpatient Prescriptions  Medication Sig Dispense Refill  . ALPRAZolam (XANAX) 0.25 MG tablet Take 1 tablet (0.25 mg total) by mouth 2 (two) times daily as needed for sleep or anxiety.  60 tablet  0  . aspirin 81 MG tablet Take 81 mg by mouth daily.      Marland Kitchen b complex vitamins tablet Take 1 tablet by mouth daily.      Chong Sicilian, Borago officinalis, (BORAGE OIL PO) Take 1,000 mg by mouth daily.      . cephALEXin (KEFLEX) 500 MG capsule Take 1 capsule (500 mg total) by mouth 4 (four) times daily.  21 capsule  0  . Cholecalciferol (VITAMIN D-3) 1000 UNITS CAPS Take 1 capsule by mouth at bedtime.      Marland Kitchen CINNAMON PO Take 2,000 mg by mouth at bedtime.      . Coenzyme Q10 (CO Q-10) 200 MG CAPS Take 1 capsule by mouth at bedtime.      . Flaxseed, Linseed, (FLAX SEED OIL) 1000 MG CAPS Take by mouth.      . hydrOXYzine (ATARAX/VISTARIL) 25 MG tablet Take 1 tablet (25 mg total) by mouth 3 (three) times daily as needed for itching.  90 tablet  1  . ketorolac (ACULAR) 0.4 % SOLN       . Krill Oil 1500 MG CAPS Take by mouth.      Marland Kitchen lisinopril-hydrochlorothiazide (PRINZIDE,ZESTORETIC) 10-12.5 MG per tablet Take 1 tablet by mouth daily.      . Magnesium 500 MG CAPS Take by mouth.      . methylPREDNISolone (MEDROL DOSEPAK) 4 MG tablet follow package directions  21 tablet  0  . Misc Natural Products (NARCOSOFT HERBAL LAX PO) Take 2 tablets by mouth.      . Multiple Vitamins-Minerals (MULTIVITAMIN WITH MINERALS) tablet Take 1 tablet by mouth daily.      Marland Kitchen ofloxacin (OCUFLOX) 0.3 % ophthalmic solution       . prednisoLONE acetate (PRED FORTE) 1 % ophthalmic suspension       . Red Yeast Rice Extract (RED YEAST RICE PO) Take 1,000 mg by mouth 2 (two) times daily.      . tamoxifen (NOLVADEX) 20 MG tablet Take 1 tablet (20 mg total) by  mouth daily.  90 tablet  12  . traMADol (ULTRAM) 50 MG tablet Take 1 tablet (50 mg total) by mouth every 8 (eight) hours as needed for pain.  90 tablet  1  . triamcinolone cream (KENALOG) 0.1 % Apply 1 application topically 2 (two) times daily.      . vitamin E 400 UNIT capsule Take 400 Units by mouth daily.  No current facility-administered medications for this visit.    OBJECTIVE: Elderly white woman in no acute distress  Filed Vitals:   03/17/13 1012  BP: 157/75  Pulse: 92  Temp: 97.8 F (36.6 C)  Resp: 20     Body mass index is 27.21 kg/(m^2).    ECOG FS: 1 Filed Weights   03/17/13 1012  Weight: 173 lb 12.8 oz (78.835 kg)   Sclerae unicteric Oropharynx clear No cervical or supraclavicular adenopathy Lungs no rales or rhonchi Heart regular rate and rhythm Abdomen is soft, nontender, with positive bowel sounds MSK no focal spinal tenderness Neuro: nonfocal, well oriented, pleasant Breasts: The right breast is unremarkable. The left breast is status post lumpectomy with no evidence of local recurrence. Axillae are benign bilaterally Skin: Her skin rash has resolved  LAB RESULTS: Lab Results  Component Value Date   WBC 11.4* 12/14/2012   NEUTROABS 6.1 12/14/2012   HGB 13.6 12/14/2012   HCT 40.5 12/14/2012   MCV 89.5 12/14/2012   PLT 226 12/14/2012      Chemistry      Component Value Date/Time   NA 137 12/30/2012 1023   NA 139 09/08/2012 1002   K 4.2 12/30/2012 1023   K 4.7 09/08/2012 1002   CL 103 12/30/2012 1023   CL 105 09/08/2012 1002   CO2 27 12/30/2012 1023   CO2 25 09/08/2012 1002   BUN 23 12/30/2012 1023   BUN 28.0* 09/08/2012 1002   CREATININE 1.1 12/30/2012 1023   CREATININE 1.2* 09/08/2012 1002      Component Value Date/Time   CALCIUM 9.1 12/30/2012 1023   CALCIUM 10.4 09/08/2012 1002   ALKPHOS 40 12/30/2012 1023   ALKPHOS 60 09/08/2012 1002   AST 25 12/30/2012 1023   AST 31 09/08/2012 1002   ALT 33 12/30/2012 1023   ALT 41 09/08/2012 1002   BILITOT 0.7  12/30/2012 1023   BILITOT 0.50 09/08/2012 1002       Lab Results  Component Value Date   LABCA2 35 07/14/2012     STUDIES:  No results found.  ASSESSMENT: 79 y.o. [name], [place] woman s/p Left lumpectomy 07/28/2012 for a pT1c NX, stage IA invasive ductal carcinoma, grade 1, estrogen receptor 100% and progesterone receptor 39% positive, with no HER-2 amplification and an MIB-1 of 7.   (1) initially positive margins were cleared 08/06/2012  (2) tamoxifen started October 2013, stopped March 2014 because of cramps and hip pain  (3)  Dermatitis due to scabies, resolved  (4) anastrozole started May 2014   PLAN: I am not sure that tamoxifen really is the cause of the hip and back pain Collyn is experiencing. We're going to obtain plain films of both hips and the lower spine and if they document significant arthritis we will make sure of those results are forwarded to her primary care physician. Otherwise I suggested she try anastrozole, beginning 03/24/2013. Of course this can also cause of aches and pains, but I find that some patients have problems on one pill and then tolerated the next Bill well. We're going to see her in 3 months just to make sure she is tolerating it well. If she is not we will give letrozole a try. She knows to call for any problems that may develop before the next visit.    Haylen Bellotti C    03/17/2013

## 2013-03-18 NOTE — Telephone Encounter (Signed)
No entry 

## 2013-04-13 IMAGING — CR DG CHEST 2V
2 series · 2 of 2 positions shown · non-contrast
Comparison: None.

CLINICAL DATA: Preop.

CHEST - 2 VIEW

[view not recorded (1 of 2)]
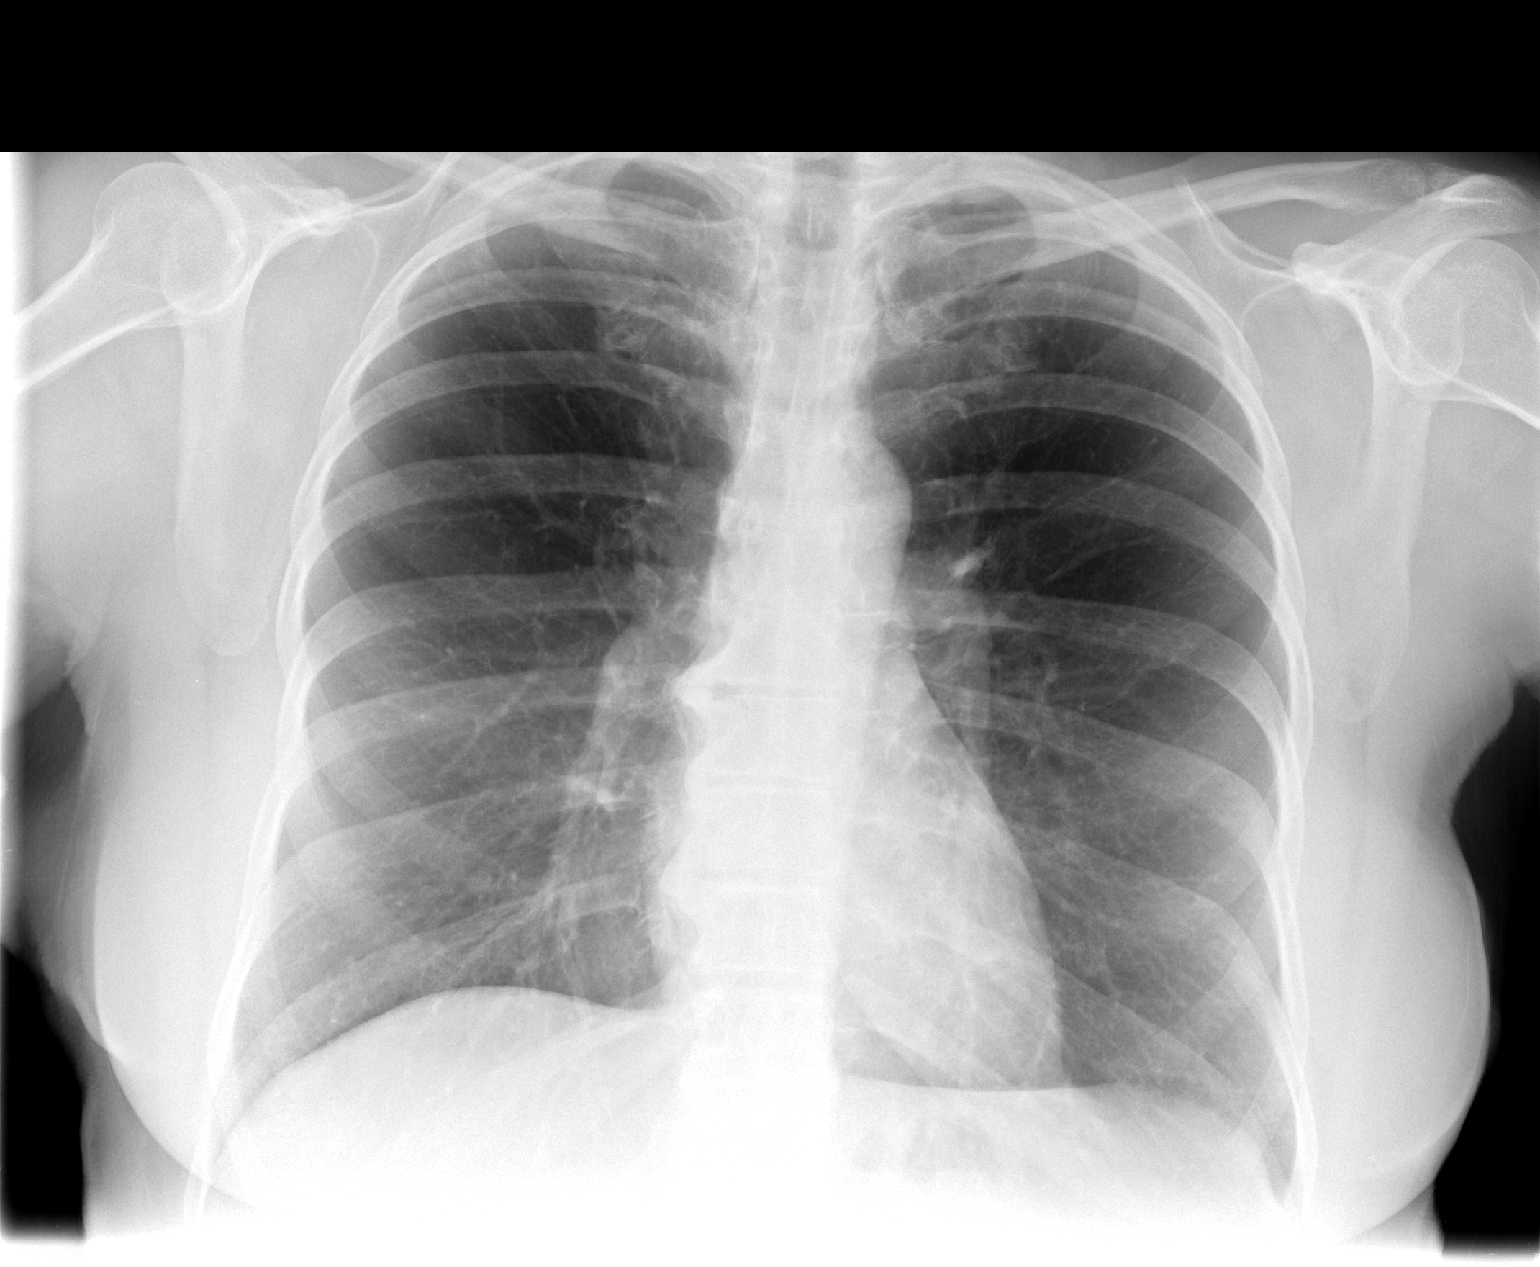

[view not recorded (2 of 2)]
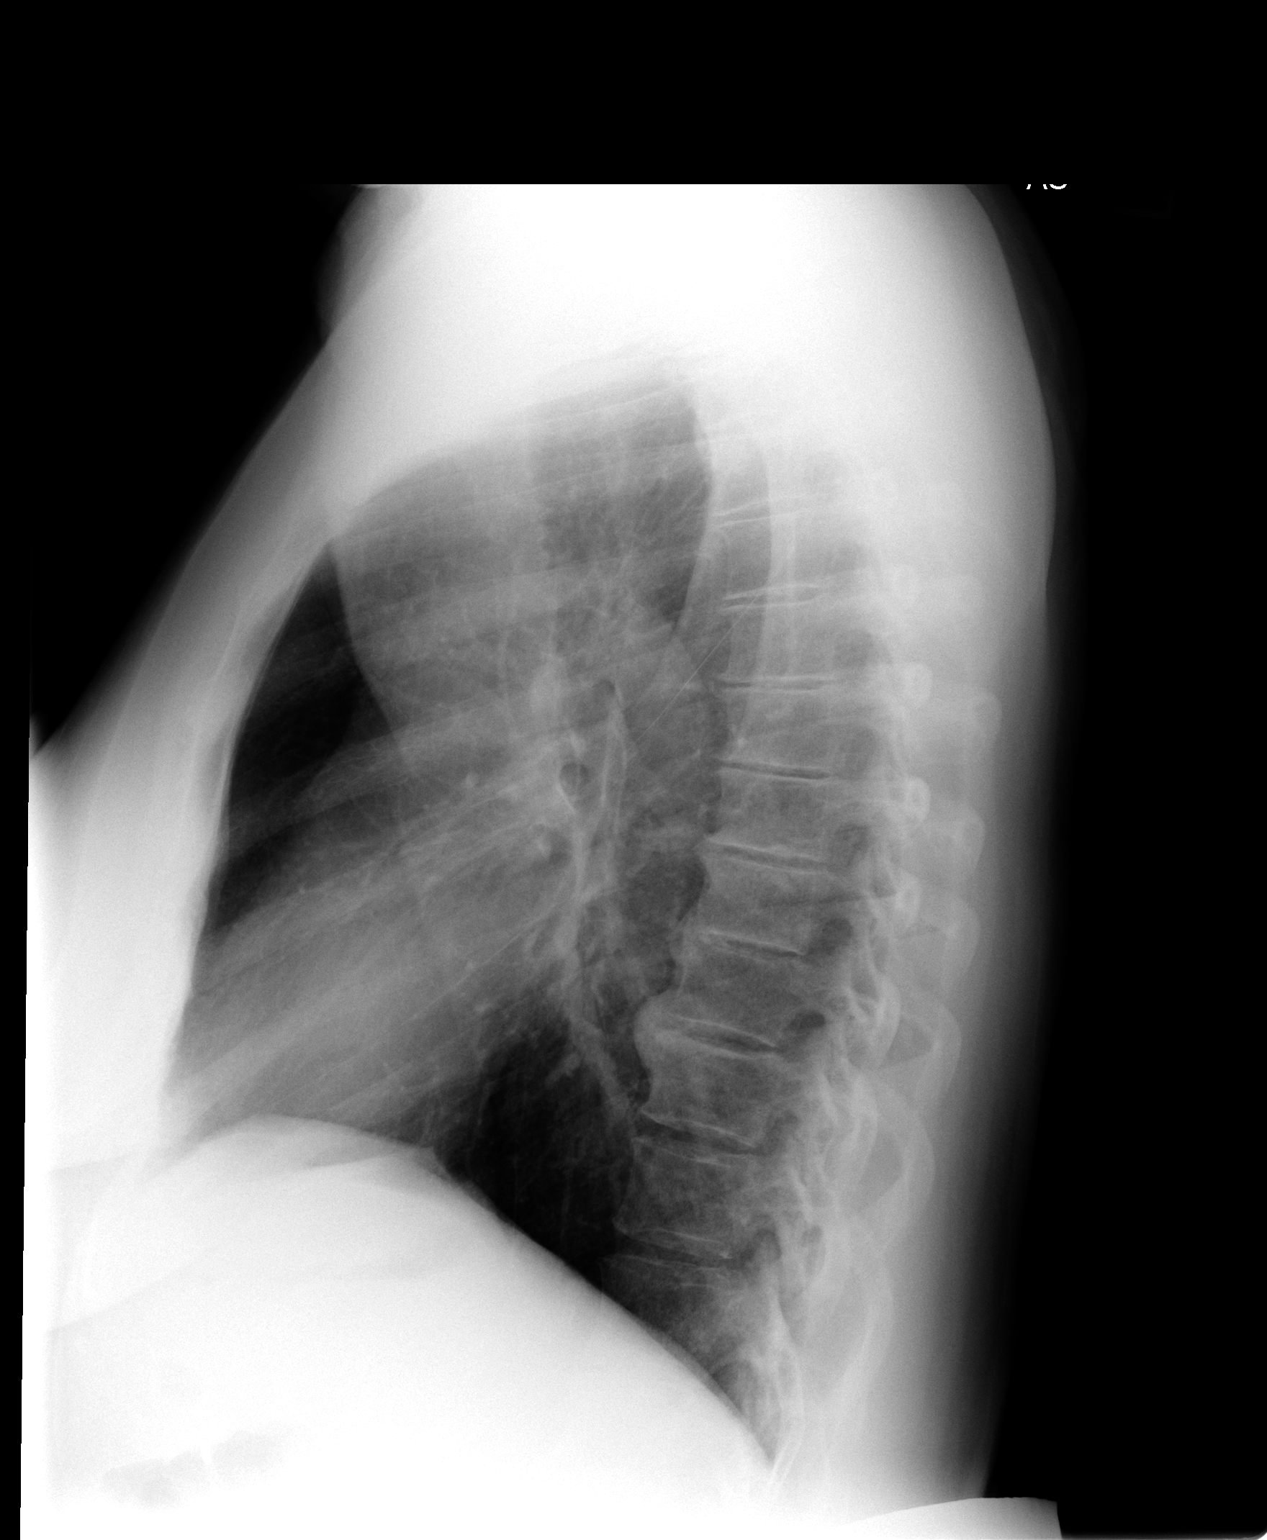

[2 of 2 positions shown; findings below may reference images not displayed]

FINDINGS: Trachea is midline.  Heart size normal.  Lungs are
hyperinflated but clear.  No pleural fluid.
IMPRESSION: No acute findings.

## 2013-04-19 ENCOUNTER — Telehealth: Payer: Self-pay | Admitting: *Deleted

## 2013-04-19 MED ORDER — LISINOPRIL-HYDROCHLOROTHIAZIDE 10-12.5 MG PO TABS: 1.0000 | ORAL_TABLET | Freq: Every day | ORAL | Status: DC

## 2013-04-19 NOTE — Telephone Encounter (Signed)
Lisinopril hctz 10/12.5 refilled

## 2013-06-14 ENCOUNTER — Other Ambulatory Visit: Payer: Self-pay | Admitting: Family

## 2013-06-15 ENCOUNTER — Telehealth: Payer: Self-pay | Admitting: Oncology

## 2013-06-15 NOTE — Telephone Encounter (Signed)
Due to [facility] CME 7/24 appt moved to 7/29. lmonvm for pt re change w/new d/t. Schedule mailed.

## 2013-06-16 ENCOUNTER — Ambulatory Visit: Payer: Medicare Other | Admitting: Family

## 2013-06-20 ENCOUNTER — Ambulatory Visit
Admission: RE | Admit: 2013-06-20 | Discharge: 2013-06-20 | Disposition: A | Discharge status: Home / Self Care | Payer: Medicare Other | Source: Ambulatory Visit | Attending: Physician Assistant | Admitting: Physician Assistant

## 2013-06-20 DIAGNOSIS — Z853 Personal history of malignant neoplasm of breast: Secondary | ICD-10-CM

## 2013-06-21 ENCOUNTER — Telehealth: Payer: Self-pay | Admitting: Oncology

## 2013-06-21 ENCOUNTER — Ambulatory Visit (HOSPITAL_BASED_OUTPATIENT_CLINIC_OR_DEPARTMENT_OTHER): Payer: Medicare Other | Admitting: Lab

## 2013-06-21 ENCOUNTER — Ambulatory Visit (HOSPITAL_BASED_OUTPATIENT_CLINIC_OR_DEPARTMENT_OTHER): Payer: Medicare Other | Admitting: Family

## 2013-06-21 ENCOUNTER — Encounter: Payer: Self-pay | Admitting: Family

## 2013-06-21 VITALS — BP 162/70 | HR 83 | Temp 98.5°F | Resp 20 | Ht 67.0 in | Wt 175.1 lb

## 2013-06-21 DIAGNOSIS — C50519 Malignant neoplasm of lower-outer quadrant of unspecified female breast: Secondary | ICD-10-CM

## 2013-06-21 DIAGNOSIS — M810 Age-related osteoporosis without current pathological fracture: Secondary | ICD-10-CM

## 2013-06-21 LAB — CBC WITH DIFFERENTIAL/PLATELET
BASO%: 0.8 % (ref 0.0–2.0)
EOS%: 1.4 % (ref 0.0–7.0)
Eosinophils Absolute: 0.1 10*3/uL (ref 0.0–0.5)
LYMPH%: 48.1 % (ref 14.0–49.7)
MCHC: 34.5 g/dL (ref 31.5–36.0)
MCV: 88.6 fL (ref 79.5–101.0)
MONO%: 6.9 % (ref 0.0–14.0)
NEUT#: 3.6 10*3/uL (ref 1.5–6.5)
RBC: 4.24 10*6/uL (ref 3.70–5.45)
RDW: 12.4 % (ref 11.2–14.5)

## 2013-06-21 LAB — COMPREHENSIVE METABOLIC PANEL (CC13)
ALT: 40 U/L (ref 0–55)
AST: 34 U/L (ref 5–34)
Albumin: 3.8 g/dL (ref 3.5–5.0)
Alkaline Phosphatase: 60 U/L (ref 40–150)
Glucose: 96 mg/dl (ref 70–140)
Potassium: 4.5 mEq/L (ref 3.5–5.1)
Sodium: 141 mEq/L (ref 136–145)
Total Bilirubin: 0.31 mg/dL (ref 0.20–1.20)
Total Protein: 7 g/dL (ref 6.4–8.3)

## 2013-06-21 MED ORDER — ANASTROZOLE 1 MG PO TABS: 1.0000 mg | ORAL_TABLET | Freq: Every day | ORAL | Status: DC

## 2013-06-21 NOTE — Patient Instructions (Addendum)
Please contact us at [phone] if you have any questions or concerns.  Please continue to do well and enjoy life!!!  Get plenty of rest, drink plenty of water, exercise daily (walking or yoga or low impact activities), eat a balanced diet.  Continue to take vitamin D3 1000 IUs daily.   Please, please, please take Anastrozole 1 mg daily.  Have bone density scan completed with your PCP soon and forwarded to our office.  Thank you.

## 2013-06-21 NOTE — Progress Notes (Signed)
[facility]  [phone] [phone]  OFFICE PROGRESS NOTE   ID: Haley Love   DOB: 07-24-1934  MR#: [id number]  [id number]   PCP:  Dr. Deborra Medina, [facility] in [place], [place]   HISTORY OF PRESENT ILLNESS: Haley Love had screening mammography at [facility] 06/17/2012 which showed a possible mass in the left breast. Left diagnostic mammography performed 07/02/2012 showed a persistent 1 cm spiculated mass in the lateral left breast, which was palpable and firm. Ultrasound showed an irregular hypoechoic mass measuring 8 mm. The left axilla was unremarkable.  Biopsy of the mass the same day (SAA [id number]) showed an invasive ductal carcinoma, grade 1, estrogen receptor 100% progesterone receptor 39% positive, with no HER-2 amplification and an MIB-1 of 7%. Bilateral breast MRI were obtained 07/08/2012 this showed an irregular enhancing mass in the left breast, middle third, measuring 9 mm. There were no other suspicious masses in either breast and no axillary or internal mammary adenopathy.  The patient's subsequent history is as detailed below.   INTERVAL HISTORY: Haley Love returns today for followup of left breast invasive ductal carcinoma.  She was last seen by our office on 03/17/2013.  Since her last office visit, her interval history is significant for not starting antiestrogen therapy with Anastrozole as was agreed upon by her and Dr. [name].  The patient stated that she did not want to return to the leg cramps and lower back pain that she experienced while taking tamoxifen.  She read that anastrozole had similar side effects and decided not to initiate the antiestrogen medication.  She states a current patient of Dr. Virgie Dad and her friend has been taking a vitamin supplement named "DIM"  from [employer] and states this is an alternative to antiestrogen therapy.  Explained concern for for breast cancer recurrence with therapies  that are not evidence based.   REVIEW OF SYSTEMS: Haley Love states that since her scabies has resolved her insomnia secondary to pruritus have also resolved.  She has continued pain in her right meniscus that she stated was torn and operated on in 08/2012 continues to give her discomfort.  She would like to be more active.  She states she feels better since discontinuing antiestrogen therapy with Tamoxifen, however. She has had no fevers, unexplained weight loss or fatigue or other systemic symptoms of concern. A detailed review of systems was otherwise stable.  PAST MEDICAL HISTORY: Past Medical History  Diagnosis Date  . Breast cancer   . Hypertension   . Contact lens/glasses fitting   . Hyperlipidemia   . Inflammatory polyps of colon   . Cataract     PAST SURGICAL HISTORY: Past Surgical History  Procedure Laterality Date  . No past surgeries    . Breast surgery  9/13    lt lump  . Mastectomy, partial  08/06/2012    Procedure: MASTECTOMY PARTIAL;  Surgeon: Adin Hector, MD;  Location: [facility];  Service: General;  Laterality: Left;  Left partial mastectomy with re-excision of margins  . Cataracts  2014  . Knee arthroscopy w/ meniscal repair  09/23/11    Right    FAMILY HISTORY Family History  Problem Relation Age of Onset  . Lung cancer Mother   . Arthritis Mother   . Hyperlipidemia Mother   . Hypertension Mother   . Throat cancer Father   . Hyperlipidemia Father   . Cancer Paternal Aunt     breast   The  patient's father died at the age of 19 from cancer of the throat which had been diagnosed 6 years earlier. The patient's mother died at the age of 43. She had cancer of the lung lining diagnosed at age 9. The patient had 4 brothers, no sisters. There is no other cancer history in the immediate family.  GYNECOLOGIC HISTORY: Menarche age 79. Change of life age 89. She took hormone replacement approximately 10 years. She is GX P2, with first live  birth at age 41.  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 She is divorced. Her son Haley Love (goes by Haley Love") also lives in [place], [place] and is self-employed and runs a Education administrator business. Daughter Haley Love lives in [place]. She works as an Futures trader. The patient has 3 grandchildren. She attends a [employer].  Interspersed time she enjoys shag dancing, reading, and cooking.  ADVANCED DIRECTIVES: In place  HEALTH MAINTENANCE: History  Substance Use Topics  . Smoking status: Never Smoker   . Smokeless tobacco: Never Used  . Alcohol Use: Yes     Comment: 1 glass wine week     Colonoscopy: 2012  PAP: 2011  Bone density: 2011  Lipid panel: 2014  No Known Allergies  Current Outpatient Prescriptions  Medication Sig Dispense Refill  . aspirin 81 MG tablet Take 81 mg by mouth daily.      Marland Kitchen b complex vitamins tablet Take 1 tablet by mouth daily.      Chong Sicilian, Borago officinalis, (BORAGE OIL PO) Take 1,000 mg by mouth daily.      . Cholecalciferol (VITAMIN D-3) 1000 UNITS CAPS Take 1 capsule by mouth at bedtime.      Marland Kitchen CINNAMON PO Take 2,000 mg by mouth at bedtime.      . Coenzyme Q10 (CO Q-10) 200 MG CAPS Take 1 capsule by mouth at bedtime.      . Flaxseed, Linseed, (FLAX SEED OIL) 1000 MG CAPS Take by mouth.      Javier Docker Oil 1500 MG CAPS Take by mouth.      Marland Kitchen lisinopril-hydrochlorothiazide (PRINZIDE,ZESTORETIC) 10-12.5 MG per tablet Take 1 tablet by mouth daily.  30 tablet  5  . Magnesium 500 MG CAPS Take 1 capsule by mouth daily.       . Misc Natural Products (NARCOSOFT HERBAL LAX PO) Take 2 tablets by mouth at bedtime.       . Multiple Vitamins-Minerals (MULTIVITAMIN WITH MINERALS) tablet Take 1 tablet by mouth daily.      . Potassium 75 MG TABS Take 1 tablet by mouth daily.      . Red Yeast Rice Extract (RED YEAST RICE PO) Take 1,000 mg by mouth 2 (two) times daily.      . vitamin E 400 UNIT  capsule Take 400 Units by mouth daily.      Marland Kitchen ALPRAZolam (XANAX) 0.25 MG tablet Take 1 tablet (0.25 mg total) by mouth 2 (two) times daily as needed for sleep or anxiety.  60 tablet  0  . anastrozole (ARIMIDEX) 1 MG tablet Take 1 tablet (1 mg total) by mouth daily.  30 tablet  12   No current facility-administered medications for this visit.    OBJECTIVE: Elderly white woman in no acute distress  Filed Vitals:   06/21/13 1011  BP: 162/70  Pulse: 83  Temp: 98.5 F (36.9 C)  Resp: 20     Body mass index is 27.[place]  kg/(m^2).    ECOG FS: 1 - Symptomatic but completely ambulatory Filed Weights   06/21/13 1011  Weight: 175 lb 1.6 oz (79.425 kg)   General appearance: Alert, cooperative, well nourished, no apparent distress Head: Normocephalic, without obvious abnormality, atraumatic Eyes: Conjunctivae/corneas clear, PERRLA, EOMI Nose: Nares, septum and mucosa are normal, no drainage or sinus tenderness Neck: No adenopathy, supple, symmetrical, trachea midline, thyroid not enlarged, no tenderness Resp: Clear to auscultation bilaterally Cardio: Regular rate and rhythm, S1, S2 normal, no murmur, click, rub or gallop Breasts:   Deferred GI: Soft, slight distention,  non-tender, hypoactive bowel sounds, no organomegaly Extremities: Extremities normal, atraumatic, no cyanosis or edema, limited right lower extremity range of motion  Lymph nodes: Cervical, supraclavicular, and axillary nodes normal Neurologic: Grossly normal   LAB RESULTS: Lab Results  Component Value Date   WBC 8.3 06/21/2013   NEUTROABS 3.6 06/21/2013   HGB 13.0 06/21/2013   HCT 37.6 06/21/2013   MCV 88.6 06/21/2013   PLT 258 06/21/2013      Chemistry      Component Value Date/Time   NA 141 06/21/2013 1144   NA 137 12/30/2012 1023   K 4.5 06/21/2013 1144   K 4.2 12/30/2012 1023   CL 103 12/30/2012 1023   CL 105 09/08/2012 1002   CO2 25 06/21/2013 1144   CO2 27 12/30/2012 1023   BUN 19.7 06/21/2013 1144   BUN 23 12/30/2012 1023    CREATININE 1.0 06/21/2013 1144   CREATININE 1.1 12/30/2012 1023      Component Value Date/Time   CALCIUM 9.9 06/21/2013 1144   CALCIUM 9.1 12/30/2012 1023   ALKPHOS 60 06/21/2013 1144   ALKPHOS 40 12/30/2012 1023   AST 34 06/21/2013 1144   AST 25 12/30/2012 1023   ALT 40 06/21/2013 1144   ALT 33 12/30/2012 1023   BILITOT 0.31 06/21/2013 1144   BILITOT 0.7 12/30/2012 1023       Lab Results  Component Value Date   LABCA2 35 07/14/2012     STUDIES: Mm Digital Diagnostic Bilat  06/20/2013   *RADIOLOGY REPORT*  Clinical Data:  History of left lumpectomy July 28, 2012.  The patient did not have radiation.  DIGITAL DIAGNOSTIC BILATERAL MAMMOGRAM WITH CAD DIGITAL BREAST TOMOSYNTHESIS  Digital breast tomosynthesis images are acquired in two projections.  These images are reviewed in combination with the digital mammogram, confirming the findings below.  Comparison: 07/02/2012 and earlier  Findings:  ACR Breast Density Category b:  There are scattered areas of fibroglandular density.  Postoperative changes are seen in the lateral aspect of the left breast. No suspicious mass, distortion, or microcalcifications are identified to suggest presence of malignancy.  Mammographic images were processed with CAD.  IMPRESSION: Expected postoperative changes in the left breast. No mammographic evidence for malignancy.  RECOMMENDATION: Diagnostic mammogram is suggested in 1 year.  I have discussed the findings and recommendations with the patient. Results were also provided in writing at the conclusion of the visit.  If applicable, a reminder letter will be sent to the patient regarding her next appointment.  BI-RADS CATEGORY 2:  Benign finding(s).   Original Report Authenticated By: Nolon Nations, M.D.     ASSESSMENT: Ms. Thackeray is a  79 y.o. [name], [place] woman s/p left lumpectomy 07/28/2012 for a pT1c NX, stage IA invasive ductal carcinoma, grade 1, estrogen receptor 100% and progesterone receptor 39% positive, with  no HER-2 amplification and an MIB-1 of 7.   (1) Initially positive margins were  cleared 08/06/2012  (2) Tamoxifen started October 2013, stopped March 2014 because of cramps and hip pain.  The patient was asked to start antiestrogen therapy with Anastrozole in 03/2013, but had not done so.  Antiestrogen therapy with anastrozole was again encouraged to start in 05/2013.    (3)  Dermatitis due to scabies, resolved  (4)  Right lower extremity torn meniscus- Limited range of motion    PLAN:  Today the patient was strongly encouraged to try Anastrozole, beginning now.  A new electronic prescription for Anastrozole 1 mg by mouth daily was sent to the patient's pharmacy.  The common side effects of Anastrozole were again reviewed in detail with the patient.  She was asked to contact us before stopping the medication if she experiences side effects from taking Anastrozole.   Ms. Thackery agreed to do so.  She states she will continue taking her estrogen supplement "DIM" from [employer] also.   Discussed obtaining a bone density scan today with Ms. Frutoso Chase, and she states she will obtain the bone density scan from her primary care physician.   We plan to see Ms. Wilmott again in 3 months to assess her tolerance of antiestrogen therapy with Anastrozole.  We will check laboratories of CBC, and CMP at that time.  Note: I also encouraged Ms. Ragasa to take all medications as prescribed as her blood pressure was elevated today (162/70).  She states she had been taking her blood pressure medication every other day.  All questions answered.  Ms. Crissinger was encouraged to contact us in the interim with any questions, concerns, or problems.   Ailene Ards NP-C 06/21/2013 7:31 PM

## 2013-06-22 ENCOUNTER — Telehealth: Payer: Self-pay | Admitting: Family

## 2013-06-22 ENCOUNTER — Other Ambulatory Visit: Payer: Self-pay | Admitting: Family

## 2013-06-22 DIAGNOSIS — C50519 Malignant neoplasm of lower-outer quadrant of unspecified female breast: Secondary | ICD-10-CM

## 2013-06-22 LAB — VITAMIN D 25 HYDROXY (VIT D DEFICIENCY, FRACTURES): Vit D, 25-Hydroxy: 51 ng/mL (ref 30–89)

## 2013-06-22 NOTE — Telephone Encounter (Signed)
Discussed lab results with Ms. Haley Love.  LDH and lymphocytes are elevated.  The patient also has a cough.  Let her know I will order a CXR.   She requested CXR to be at [facility].  Written order for CXR faxed to [facility]iology [facility] at [phone].  Confirmed with Crystal at [facility] that CXR faxed orders were received and that Ms. Haley Love could come at anytime for CXR.  I asked on the CXR Rx that results of the CXR be faxed to Dr. [name]. Per Dr. [name], we will repeat LDH during next office visit.    Ms. Haley Love stated she got Arimidex Rx filled last night and took medication.  She stated that she had terrible leg cramps and had not taken the medication today.  Encouraged her to continue using medication for the 3 months until her next OV and see if her symptoms improve.  Ms. Haley Love stated she would try the medication again.  Ms. Haley Love stated that her PCP does not offer bone density scans as she thought.  Patient will need a bone density scan if she continues antiestrogen therapy with an AI.

## 2013-06-23 ENCOUNTER — Ambulatory Visit: Payer: Self-pay

## 2013-07-04 ENCOUNTER — Ambulatory Visit: Payer: Self-pay | Admitting: General Practice

## 2013-07-22 ENCOUNTER — Encounter (INDEPENDENT_AMBULATORY_CARE_PROVIDER_SITE_OTHER): Payer: Self-pay | Admitting: General Surgery

## 2013-07-22 ENCOUNTER — Ambulatory Visit (INDEPENDENT_AMBULATORY_CARE_PROVIDER_SITE_OTHER): Payer: Medicare Other | Admitting: General Surgery

## 2013-07-22 VITALS — BP 120/78 | HR 78 | Resp 18 | Ht 67.0 in | Wt 174.4 lb

## 2013-07-22 DIAGNOSIS — C50519 Malignant neoplasm of lower-outer quadrant of unspecified female breast: Secondary | ICD-10-CM

## 2013-07-22 DIAGNOSIS — C50512 Malignant neoplasm of lower-outer quadrant of left female breast: Secondary | ICD-10-CM

## 2013-07-22 NOTE — Progress Notes (Signed)
Patient ID: Haley Love, female   DOB: January 22, 1934, 79 y.o.   MRN: [id number] History: This patient returns for long-term followup of her left breast cancer. On July 28, 2012 she underwent left partial mastectomy with needle localization. 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 This was not expected as the specimen mammogram showed the marker clip in the center of the specimen. She underwent reexcision of margins and there was no residual cancer.  She was advised that she did not radiate need radiation therapy and was started on tamoxifen. She did not tolerate that due to myalgias. She has been advised to switch to anastrozole. She states she took one pill and it caused pain in her knee. She is now taking a supplement called DIM (dindolymethane )plu herbal suppliments. She states this is also an antiestrogen. Bilateral mammograms were performed on 06/20/2013, these looked good, no focal d, category 2.  ROS: 10 system review of systems is negative except as described above. She has had knee arthroscopy he still has knee pain.  Exam: Patient looks well. No distress Neck: No adenopathy or mass Lungs: Clear to auscultation bilaterally Heart: Regular rate and rhythm. No murmur. No ectopy Breast. Both breasts are examined. Well-healed lumpectomy scar on the left. No mass in either breast. No other skin changes. No axillary adenopathy.  Assessment: Next invasive and noninvasive ductal carcinoma left breast, lower outer quadrant, receptor positive, HER-2-negative, pathologic stage T1c,Nx. No evidence of recurrence one year following left partial mastectomy and reexcision of margins Intolerance to antiestrogen therapy Hypertension  Plan: Repeat bilateral mammograms and return to see me in one year Continue careful followup Dr. [name]. I told her to ask him whether he thought that she would  need radiation therapy, since she is intolerant to antiestrogen therapy.    Edsel Petrin. Dalbert Batman, M.D., [facility] General and Minimally invasive Surgery Breast and Colorectal Surgery Office:   [phone] Pager:   [phone]

## 2013-07-22 NOTE — Patient Instructions (Signed)
Examination of your breast and all of the lymph node areas today is normal. Your recent mammograms are also normal. There is no evidence of cancer.  Please discuss with Dr. [name] whether you need radiation therapy, since you cannot tolerate antiestrogen therapy.  Return to see Dr. Dalbert Batman in one year, after you get your annual mammograms.

## 2013-08-03 ENCOUNTER — Encounter: Payer: Self-pay | Admitting: Internal Medicine

## 2013-08-03 ENCOUNTER — Ambulatory Visit (INDEPENDENT_AMBULATORY_CARE_PROVIDER_SITE_OTHER): Payer: Medicare Other | Admitting: Internal Medicine

## 2013-08-03 ENCOUNTER — Other Ambulatory Visit (HOSPITAL_COMMUNITY)
Admission: RE | Admit: 2013-08-03 | Discharge: 2013-08-03 | Disposition: A | Discharge status: Home / Self Care | Payer: Medicare Other | Source: Ambulatory Visit | Attending: Internal Medicine | Admitting: Internal Medicine

## 2013-08-03 VITALS — BP 104/60 | HR 87 | Temp 98.3°F | Resp 12 | Ht 66.5 in | Wt 174.5 lb

## 2013-08-03 DIAGNOSIS — C50512 Malignant neoplasm of lower-outer quadrant of left female breast: Secondary | ICD-10-CM

## 2013-08-03 DIAGNOSIS — C50519 Malignant neoplasm of lower-outer quadrant of unspecified female breast: Secondary | ICD-10-CM

## 2013-08-03 DIAGNOSIS — Z01419 Encounter for gynecological examination (general) (routine) without abnormal findings: Secondary | ICD-10-CM

## 2013-08-03 DIAGNOSIS — E785 Hyperlipidemia, unspecified: Secondary | ICD-10-CM

## 2013-08-03 DIAGNOSIS — Z1151 Encounter for screening for human papillomavirus (HPV): Secondary | ICD-10-CM | POA: Insufficient documentation

## 2013-08-03 DIAGNOSIS — R5381 Other malaise: Secondary | ICD-10-CM

## 2013-08-03 DIAGNOSIS — M7050 Other bursitis of knee, unspecified knee: Secondary | ICD-10-CM

## 2013-08-03 DIAGNOSIS — M76899 Other specified enthesopathies of unspecified lower limb, excluding foot: Secondary | ICD-10-CM

## 2013-08-03 DIAGNOSIS — Z0000 Encounter for general adult medical examination without abnormal findings: Secondary | ICD-10-CM

## 2013-08-03 DIAGNOSIS — Z8619 Personal history of other infectious and parasitic diseases: Secondary | ICD-10-CM | POA: Insufficient documentation

## 2013-08-03 DIAGNOSIS — Z124 Encounter for screening for malignant neoplasm of cervix: Secondary | ICD-10-CM

## 2013-08-03 LAB — TSH: TSH: 1.17 u[IU]/mL (ref 0.35–5.50)

## 2013-08-03 LAB — LIPID PANEL
Cholesterol: 274 mg/dL — ABNORMAL HIGH (ref 0–200)
Total CHOL/HDL Ratio: 7
Triglycerides: 420 mg/dL — ABNORMAL HIGH (ref 0.0–149.0)
VLDL: 84 mg/dL — ABNORMAL HIGH (ref 0.0–40.0)

## 2013-08-03 LAB — COMPREHENSIVE METABOLIC PANEL
AST: 43 U/L — ABNORMAL HIGH (ref 0–37)
Alkaline Phosphatase: 50 U/L (ref 39–117)
BUN: 43 mg/dL — ABNORMAL HIGH (ref 6–23)
Creatinine, Ser: 1.4 mg/dL — ABNORMAL HIGH (ref 0.4–1.2)
Glucose, Bld: 113 mg/dL — ABNORMAL HIGH (ref 70–99)
Total Bilirubin: 0.5 mg/dL (ref 0.3–1.2)

## 2013-08-03 MED ORDER — TETANUS-DIPHTH-ACELL PERTUSSIS 5-2.5-18.5 LF-MCG/0.5 IM SUSP: 0.5000 mL | Freq: Once | INTRAMUSCULAR | Status: DC

## 2013-08-03 NOTE — Progress Notes (Signed)
Patient ID: Haley Love, female   DOB: 1933-12-26, 79 y.o.   MRN: [id number]   The patient is here for annual Medicare wellness examination and management of other chronic and acute problems. 1) Right knee pain, Has stopped walking her usual 3 miles daily  For the past 6 or 7 months .  Recently saw [facility].    Using voltaren twice daily which has helped.  Future synvisc  Injections planned if pain persists but no plans for knee replacement . History of Torn meniscus repaired Oct 2013.  Sudden onet this am of stiffness in the morning.  Better with movement.    Patient declines influenza and pneumococcal vaccinatiosn despite recommendations today.  Previous  6 month episode of pruritic rash which was initially diagnosed as neurodermatitis by Dr. Koleen Nimrod was finally diagnosed as scabies and treated by a second dermatologic opinion. Her symptoms have completely resolved after treatment.  it appears that she came in contact with scabies when she sat in a recliner for several hours during the procedure [place] 6 months ago.    The risk factors are reflected in the social history.  The roster of all physicians providing medical care to patient - is listed in the Snapshot section of the chart.  Activities of daily living:  The patient is 100% independent in all ADLs: dressing, toileting, feeding as well as independent mobility  Home safety : The patient has smoke detectors in the home. They wear seatbelts.  There are no firearms at home. There is no violence in the home.   There is no risks for hepatitis, STDs or HIV. There is no   history of blood transfusion. They have no travel history to infectious disease endemic areas of the world.  The patient has seen their dentist in the last six month. They have seen their eye doctor in the last year. They admit to slight hearing difficulty with regard to whispered voices and some television programs.  They have deferred audiologic testing in the last  year.  They do not  have excessive sun exposure. Discussed the need for sun protection: hats, long sleeves and use of sunscreen if there is significant sun exposure.   Diet: the importance of a healthy diet is discussed. They do have a healthy diet.  The benefits of regular aerobic exercise were discussed. She walks 4 times per week ,  20 minutes.   Depression screen: there are no signs or vegative symptoms of depression- irritability, change in appetite, anhedonia, sadness/tearfullness.  Cognitive assessment: the patient manages all their financial and personal affairs and is actively engaged. They could relate day,date,year and events; recalled 2/3 objects at 3 minutes; performed clock-face test normally.  The following portions of the patient's history were reviewed and updated as appropriate: allergies, current medications, past family history, past medical history,  past surgical history, past social history  and problem list.  Visual acuity was not assessed per patient preference since she has regular follow up with her ophthalmologist. Hearing and body mass index were assessed and reviewed.   During the course of the visit the patient was educated and counseled about appropriate screening and preventive services including : fall prevention , diabetes screening, nutrition counseling, colorectal cancer screening, and recommended immunizations.    Objective:   BP 104/60  Pulse 87  Temp(Src) 98.3 F (36.8 C) (Oral)  Resp 12  Ht 5' 6.5" (1.689 m)  Wt 174 lb 8 oz (79.153 kg)  BMI 27.75 kg/m2  SpO2  93%  General Appearance:    Alert, cooperative, no distress, appears stated age  Head:    Normocephalic, without obvious abnormality, atraumatic  Eyes:    PERRL, conjunctiva/corneas clear, EOM's intact, fundi    benign, both eyes  Ears:    Normal TM's and external ear canals, both ears  Nose:   Nares normal, septum midline, mucosa normal, no drainage    or sinus tenderness  Throat:    Lips, mucosa, and tongue normal; teeth and gums normal  Neck:   Supple, symmetrical, trachea midline, no adenopathy;    thyroid:  no enlargement/tenderness/nodules; no carotid   bruit or JVD  Back:     Symmetric, no curvature, ROM normal, no CVA tenderness  Lungs:     Clear to auscultation bilaterally, respirations unlabored  Chest Wall:    No tenderness or deformity   Heart:    Regular rate and rhythm, S1 and S2 normal, no murmur, rub   or gallop  Breast Exam:    Left mastectomy, No tenderness, masses, or nipple abnormality on right   Abdomen:     Soft, non-tender, bowel sounds active all four quadrants,    no masses, no organomegaly  Genitalia:    Pelvic: cervix normal in appearance, external genitalia normal, no adnexal masses or tenderness, no cervical motion tenderness, rectovaginal septum normal, uterus normal size, shape, and consistency and vagina normal without discharge  Extremities:   Extremities normal, atraumatic, no cyanosis or edema  Pulses:   2+ and symmetric all extremities  Skin:   Skin color, texture, turgor normal, no rashes or lesions  Lymph nodes:   Cervical, supraclavicular, and axillary nodes normal  Neurologic:   CNII-XII intact, normal strength, sensation and reflexes    throughout    Assessment and Plan:   Cancer of lower-outer quadrant of female breast She did not tolerate aromatase therapy. She has been sent back to oncology for determination of need for radiation treatment given her an intolerance to aromatase therapy. 15 minute discussion with her today about her cancer and end-of-life decisions. She is inclined to forego all future treatments of CA  given her age and her desire for quality , not quantity of life.  Bursitis of knee Recurrent, with prior meniscotomy by Dr. Marry Guan. Voltaren and followup with Dr. Tommi Rumps as needed for consideration of Synvisc injections  History of scabies After 6 months of missed diagnosis she was finally diagnosed and  treated by Dr. Kellie Moor.  Screening for cervical cancer Her last Pap smear was done today with some difficulty as her cervix is deep and retroverted.  Routine general medical examination at a health care facility Annual comprehensive exam was done including breast, pelvic and PAP smear. All screenings have been addressed .    Updated Medication List Outpatient Encounter Prescriptions as of 08/03/2013  Medication Sig Dispense Refill  . aspirin 81 MG tablet Take 81 mg by mouth daily.      Marland Kitchen b complex vitamins tablet Take 1 tablet by mouth daily.      Chong Sicilian, Borago officinalis, (BORAGE OIL PO) Take 1,000 mg by mouth daily.      . Cholecalciferol (VITAMIN D-3) 1000 UNITS CAPS Take 1 capsule by mouth at bedtime.      Marland Kitchen CINNAMON PO Take 2,000 mg by mouth at bedtime.      . Coenzyme Q10 (CO Q-10) 200 MG CAPS Take 1 capsule by mouth at bedtime.      Marland Kitchen DICLOFENAC PO Take by mouth.      Marland Kitchen  Flaxseed, Linseed, (FLAX SEED OIL) 1000 MG CAPS Take by mouth.      Javier Docker Oil 1500 MG CAPS Take by mouth.      Marland Kitchen lisinopril-hydrochlorothiazide (PRINZIDE,ZESTORETIC) 10-12.5 MG per tablet Take 1 tablet by mouth daily.  30 tablet  5  . Magnesium 500 MG CAPS Take 1 capsule by mouth daily.       . Misc Natural Products (NARCOSOFT HERBAL LAX PO) Take 2 tablets by mouth at bedtime.       . Multiple Vitamins-Minerals (MULTIVITAMIN WITH MINERALS) tablet Take 1 tablet by mouth daily.      . Potassium 75 MG TABS Take 1 tablet by mouth daily.      . Red Yeast Rice Extract (RED YEAST RICE PO) Take 1,000 mg by mouth 2 (two) times daily.      Marland Kitchen UNABLE TO FIND DIM for estrogen metabolism      . vitamin E 400 UNIT capsule Take 400 Units by mouth daily.      . TDaP (BOOSTRIX) 5-2.5-18.5 LF-MCG/0.5 injection Inject 0.5 mLs into the muscle once.  0.5 mL  0   No facility-administered encounter medications on file as of 08/03/2013.

## 2013-08-03 NOTE — Patient Instructions (Addendum)
You had your annual Medicare wellness exam today  I do recommend the  TDaP vaccine and the pneumonia vaccine.  I have given you prescriptions for the TDaP because it will be less expensive  at the health Dept or at your local pharmacy because Medicare will not reimburse for them.   You can received the pneumonia vaccine without charge at our office if you change your mind   We will contact you with the bloodwork results

## 2013-08-04 ENCOUNTER — Encounter: Payer: Self-pay | Admitting: Internal Medicine

## 2013-08-04 DIAGNOSIS — Z0000 Encounter for general adult medical examination without abnormal findings: Secondary | ICD-10-CM | POA: Insufficient documentation

## 2013-08-04 DIAGNOSIS — Z96659 Presence of unspecified artificial knee joint: Secondary | ICD-10-CM | POA: Insufficient documentation

## 2013-08-04 NOTE — Assessment & Plan Note (Signed)
Recurrent, with prior meniscotomy by Dr. Marry Guan. Voltaren and followup with Dr. Tommi Rumps as needed for consideration of Synvisc injections

## 2013-08-04 NOTE — Assessment & Plan Note (Signed)
Her last Pap smear was done today with some difficulty as her cervix is deep and retroverted.

## 2013-08-04 NOTE — Assessment & Plan Note (Signed)
Annual comprehensive exam was done including breast, pelvic and PAP smear. All screenings have been addressed .  

## 2013-08-04 NOTE — Assessment & Plan Note (Signed)
She did not tolerate aromatase therapy. She has been sent back to oncology for determination of need for radiation treatment given her an intolerance to aromatase therapy. 15 minute discussion with her today about her cancer and end-of-life decisions. She is inclined to forego all future treatments of CA  given her age and her desire for quality , not quantity of life.

## 2013-08-04 NOTE — Assessment & Plan Note (Signed)
After 6 months of missed diagnosis she was finally diagnosed and treated by Dr. Kellie Moor.

## 2013-08-05 NOTE — Addendum Note (Signed)
Addended by: Crecencio Mc on: 08/05/2013 07:11 AM   Modules accepted: Orders

## 2013-08-08 LAB — HM PAP SMEAR: HM Pap smear: NEGATIVE

## 2013-08-10 ENCOUNTER — Other Ambulatory Visit (INDEPENDENT_AMBULATORY_CARE_PROVIDER_SITE_OTHER): Payer: Medicare Other

## 2013-08-10 ENCOUNTER — Telehealth: Payer: Self-pay | Admitting: *Deleted

## 2013-08-10 DIAGNOSIS — R5381 Other malaise: Secondary | ICD-10-CM

## 2013-08-10 LAB — COMPREHENSIVE METABOLIC PANEL
AST: 32 U/L (ref 0–37)
Albumin: 4.2 g/dL (ref 3.5–5.2)
Alkaline Phosphatase: 48 U/L (ref 39–117)
BUN: 42 mg/dL — ABNORMAL HIGH (ref 6–23)
Calcium: 9.9 mg/dL (ref 8.4–10.5)
Chloride: 104 mEq/L (ref 96–112)
Creatinine, Ser: 1.4 mg/dL — ABNORMAL HIGH (ref 0.4–1.2)
Glucose, Bld: 114 mg/dL — ABNORMAL HIGH (ref 70–99)
Potassium: 5.4 mEq/L — ABNORMAL HIGH (ref 3.5–5.1)

## 2013-08-10 NOTE — Addendum Note (Signed)
Addended by: Crecencio Mc on: 08/10/2013 10:30 PM   Modules accepted: Orders

## 2013-08-10 NOTE — Telephone Encounter (Signed)
Pt came in consider of her labs that she had 6 days ago, would like to know if there are certain things she should be eating or staying away from, would like something information mailed to her about the "foods" and someone to call her as well    [phone]

## 2013-08-10 NOTE — Telephone Encounter (Signed)
Patient has an appointment to discuss labs with Dr. Derrel Nip, no diet advised at this time. Repeat labs done today.

## 2013-09-02 ENCOUNTER — Encounter: Payer: Self-pay | Admitting: Internal Medicine

## 2013-09-02 ENCOUNTER — Ambulatory Visit (INDEPENDENT_AMBULATORY_CARE_PROVIDER_SITE_OTHER): Payer: Medicare Other | Admitting: Internal Medicine

## 2013-09-02 VITALS — BP 122/60 | HR 82 | Temp 98.6°F | Resp 14 | Ht 66.5 in | Wt 178.0 lb

## 2013-09-02 DIAGNOSIS — M7051 Other bursitis of knee, right knee: Secondary | ICD-10-CM

## 2013-09-02 DIAGNOSIS — M76899 Other specified enthesopathies of unspecified lower limb, excluding foot: Secondary | ICD-10-CM

## 2013-09-02 DIAGNOSIS — N179 Acute kidney failure, unspecified: Secondary | ICD-10-CM

## 2013-09-02 DIAGNOSIS — E785 Hyperlipidemia, unspecified: Secondary | ICD-10-CM

## 2013-09-02 MED ORDER — PREDNISONE 10 MG PO TABS: ORAL_TABLET | ORAL | Status: DC

## 2013-09-02 MED ORDER — TRAMADOL HCL 50 MG PO TABS: 50.0000 mg | ORAL_TABLET | Freq: Three times a day (TID) | ORAL | Status: DC | PRN

## 2013-09-02 NOTE — Progress Notes (Signed)
Patient ID: Haley Love, female   DOB: 11-Mar-1934, 79 y.o.   MRN: [id number]   Patient Active Problem List   Diagnosis Date Noted  . Acute renal failure 09/04/2013  . Bursitis of knee 08/04/2013  . Routine general medical examination at a health care facility 08/04/2013  . History of scabies 08/03/2013  . Hyperlipidemia LDL goal < 100 12/22/2012  . Colon polyps 12/21/2012  . Screening for cervical cancer 12/21/2012  . Dermatitis 12/14/2012  . Cancer of lower-outer quadrant of female breast 07/06/2012    Subjective:  CC:   Chief Complaint  Patient presents with  . Follow-up    to discuss labs    HPI:   Haley Love a 79 y.o. female who presents for follow up on abnormal BUN/Cr and potassium and abnormal triglycerides.  She has not stopped diclofenac and potassium OTC supplement bc she has been at the beach for the past 2 weeks and did not get the message to call back . She is very dependent on the diclofenac for management of right knee pain.   Refuses the influenza and the prevnar vaccine despite advisement    Past Medical History  Diagnosis Date  . Breast cancer   . Hypertension   . Contact lens/glasses fitting   . Hyperlipidemia   . Inflammatory polyps of colon   . Cataract     Past Surgical History  Procedure Laterality Date  . No past surgeries    . Breast surgery  9/13    lt lump  . Mastectomy, partial  08/06/2012    Procedure: MASTECTOMY PARTIAL;  Surgeon: Adin Hector, MD;  Location: [facility];  Service: General;  Laterality: Left;  Left partial mastectomy with re-excision of margins  . Cataracts  2014  . Knee arthroscopy w/ meniscal repair  09/23/11    Right       The following portions of the patient's history were reviewed and updated as appropriate: Allergies, current medications, and problem list.    Review of Systems:   12 Pt  review of systems was negative except those addressed in the HPI,     History    Social History  . Marital Status: Divorced    Spouse Name: N/A    Number of Children: N/A  . Years of Education: N/A   Occupational History  . Not on file.   Social History Main Topics  . Smoking status: Never Smoker   . Smokeless tobacco: Never Used  . Alcohol Use: Yes     Comment: 1 glass wine week  . Drug Use: No  . Sexual Activity: Not Currently    Birth Control/ Protection: Post-menopausal   Other Topics Concern  . Not on file   Social History Narrative   Lives alone ,      Objective:  Filed Vitals:   09/02/13 1400  BP: 122/60  Pulse: 82  Temp: 98.6 F (37 C)  Resp: 14     General appearance: alert, cooperative and appears stated age Ears: normal TM's and external ear canals both ears Throat: lips, mucosa, and tongue normal; teeth and gums normal Neck: no adenopathy, no carotid bruit, supple, symmetrical, trachea midline and thyroid not enlarged, symmetric, no tenderness/mass/nodules Back: symmetric, no curvature. ROM normal. No CVA tenderness. Lungs: clear to auscultation bilaterally Heart: regular rate and rhythm, S1, S2 normal, no murmur, click, rub or gallop Abdomen: soft, non-tender; bowel sounds normal; no masses,  no organomegaly Pulses: 2+ and symmetric Skin:  Skin color, texture, turgor normal. No rashes or lesions Lymph nodes: Cervical, supraclavicular, and axillary nodes normal.  Assessment and Plan:  Acute renal failure Cr was noted to rise from 1.0 to 1.4 and mild hyperkalemia was noted.  She will stop the diclofenac and potassium supplement and repeat a BMET in one week. If no improvement will need to stop the lisinopril/hct  Hyperlipidemia LDL goal < 100 Managed with [facility] per patient preference.  Triglycerides were 420 recently, LDL 161. She has no interest in starting a statin or fenofibrate at this time.    Bursitis of knee prednisone taper and tramadol prn for interim while diclofenac has been suspended.    Updated Medication  List Outpatient Encounter Prescriptions as of 09/02/2013  Medication Sig Dispense Refill  . aspirin 81 MG tablet Take 81 mg by mouth daily.      Marland Kitchen b complex vitamins tablet Take 1 tablet by mouth daily.      Chong Sicilian, Borago officinalis, (BORAGE OIL PO) Take 1,000 mg by mouth daily.      . Cholecalciferol (VITAMIN D-3) 1000 UNITS CAPS Take 1 capsule by mouth at bedtime.      Marland Kitchen CINNAMON PO Take 2,000 mg by mouth at bedtime.      . Coenzyme Q10 (CO Q-10) 200 MG CAPS Take 1 capsule by mouth at bedtime.      . diclofenac (VOLTAREN) 75 MG EC tablet Take 1 tablet by mouth 2 (two) times daily.      . Flaxseed, Linseed, (FLAX SEED OIL) 1000 MG CAPS Take by mouth.      Javier Docker Oil 1500 MG CAPS Take by mouth.      Marland Kitchen lisinopril-hydrochlorothiazide (PRINZIDE,ZESTORETIC) 10-12.5 MG per tablet Take 1 tablet by mouth daily.  30 tablet  5  . Magnesium 500 MG CAPS Take 1 capsule by mouth daily.       . Misc Natural Products (NARCOSOFT HERBAL LAX PO) Take 2 tablets by mouth at bedtime.       . Multiple Vitamins-Minerals (MULTIVITAMIN WITH MINERALS) tablet Take 1 tablet by mouth daily.      . Potassium 75 MG TABS Take 1 tablet by mouth daily.      . Red Yeast Rice Extract (RED YEAST RICE PO) Take 1,000 mg by mouth 2 (two) times daily.      Marland Kitchen UNABLE TO FIND DIM for estrogen metabolism      . vitamin E 400 UNIT capsule Take 400 Units by mouth daily.      . [DISCONTINUED] DICLOFENAC PO Take by mouth.      . predniSONE (DELTASONE) 10 MG tablet 6 tablets on day 1,  Decrease by 1 tablet daily until gone (6-5-4-3-2-1-stop)  21 tablet  0  . TDaP (BOOSTRIX) 5-2.5-18.5 LF-MCG/0.5 injection Inject 0.5 mLs into the muscle once.  0.5 mL  0  . traMADol (ULTRAM) 50 MG tablet Take 1 tablet (50 mg total) by mouth every 8 (eight) hours as needed for pain.  30 tablet  0   No facility-administered encounter medications on file as of 09/02/2013.

## 2013-09-02 NOTE — Patient Instructions (Signed)
Please stop your potassium supplement and your diclofenac.  They may be causing your abnormal kidney tests  You can take the prednisone in a tapering dose for the next week along with tramadol ,  A pain reliever, if needed ,    We Will have you return to repeat your kidney test in one week

## 2013-09-04 DIAGNOSIS — D631 Anemia in chronic kidney disease: Secondary | ICD-10-CM | POA: Insufficient documentation

## 2013-09-04 NOTE — Assessment & Plan Note (Addendum)
Managed with [facility] per patient preference.  Triglycerides were 420 recently, LDL 161. She has no interest in starting a statin or fenofibrate at this time.

## 2013-09-04 NOTE — Assessment & Plan Note (Addendum)
Cr was noted to rise from 1.0 to 1.4 and mild hyperkalemia was noted.  She will stop the diclofenac and potassium supplement and repeat a BMET in one week. If no improvement will need to stop the lisinopril/hct

## 2013-09-04 NOTE — Assessment & Plan Note (Signed)
prednisone taper and tramadol prn for interim while diclofenac has been suspended.

## 2013-09-08 ENCOUNTER — Encounter: Payer: Self-pay | Admitting: *Deleted

## 2013-09-09 ENCOUNTER — Encounter (INDEPENDENT_AMBULATORY_CARE_PROVIDER_SITE_OTHER): Payer: Self-pay

## 2013-09-09 ENCOUNTER — Other Ambulatory Visit (INDEPENDENT_AMBULATORY_CARE_PROVIDER_SITE_OTHER): Payer: Medicare Other

## 2013-09-09 LAB — COMPREHENSIVE METABOLIC PANEL
AST: 32 U/L (ref 0–37)
Albumin: 4.1 g/dL (ref 3.5–5.2)
Alkaline Phosphatase: 52 U/L (ref 39–117)
BUN: 47 mg/dL — ABNORMAL HIGH (ref 6–23)
Calcium: 9.7 mg/dL (ref 8.4–10.5)
Chloride: 101 mEq/L (ref 96–112)
Potassium: 4.3 mEq/L (ref 3.5–5.1)
Sodium: 136 mEq/L (ref 135–145)
Total Protein: 6.8 g/dL (ref 6.0–8.3)

## 2013-09-12 ENCOUNTER — Other Ambulatory Visit: Payer: Self-pay | Admitting: Internal Medicine

## 2013-09-12 DIAGNOSIS — N183 Chronic kidney disease, stage 3 (moderate): Secondary | ICD-10-CM

## 2013-09-13 ENCOUNTER — Telehealth: Payer: Self-pay | Admitting: *Deleted

## 2013-09-13 NOTE — Telephone Encounter (Signed)
Patient called concerned as to why she is being referred to Nephrology since her Kidney function is better as stated in lab note 10 /19/14 please advise.

## 2013-09-14 NOTE — Telephone Encounter (Signed)
Spoke with patient and advised as directed and she verbalized understanding and will go to her appt on Monday

## 2013-09-14 NOTE — Telephone Encounter (Signed)
Because although her kidney function is improved, it is not normal, and I would like nephorlogy to evaluate her so we can decide whtether she needs to avoid medications like diclofenac permanently.

## 2013-09-19 ENCOUNTER — Ambulatory Visit: Payer: Self-pay | Admitting: Nephrology

## 2013-09-22 ENCOUNTER — Other Ambulatory Visit (INDEPENDENT_AMBULATORY_CARE_PROVIDER_SITE_OTHER): Payer: Medicare Other

## 2013-09-22 LAB — COMPREHENSIVE METABOLIC PANEL WITH GFR
ALT: 37 U/L — ABNORMAL HIGH (ref 0–35)
AST: 32 U/L (ref 0–37)
Albumin: 4 g/dL (ref 3.5–5.2)
Alkaline Phosphatase: 50 U/L (ref 39–117)
BUN: 28 mg/dL — ABNORMAL HIGH (ref 6–23)
CO2: 25 meq/L (ref 19–32)
Calcium: 9.8 mg/dL (ref 8.4–10.5)
Chloride: 102 meq/L (ref 96–112)
Creatinine, Ser: 1.3 mg/dL — ABNORMAL HIGH (ref 0.4–1.2)
GFR: 41.14 mL/min — ABNORMAL LOW
Glucose, Bld: 143 mg/dL — ABNORMAL HIGH (ref 70–99)
Potassium: 4.2 meq/L (ref 3.5–5.1)
Sodium: 137 meq/L (ref 135–145)
Total Bilirubin: 0.8 mg/dL (ref 0.3–1.2)
Total Protein: 7.2 g/dL (ref 6.0–8.3)

## 2013-09-22 LAB — FERRITIN: Ferritin: 106.6 ng/mL (ref 10.0–291.0)

## 2013-09-23 ENCOUNTER — Telehealth: Payer: Self-pay | Admitting: Oncology

## 2013-09-23 LAB — ANTI-SMITH ANTIBODY: ENA SM Ab Ser-aCnc: 1 AU/mL (ref <30)

## 2013-09-23 LAB — HEPATITIS B SURFACE ANTIGEN: Hepatitis B Surface Ag: NEGATIVE

## 2013-09-23 LAB — ANA: Anti Nuclear Antibody(ANA): NEGATIVE

## 2013-09-23 LAB — HEPATITIS B CORE ANTIBODY, TOTAL: Hep B Core Total Ab: NONREACTIVE

## 2013-09-23 NOTE — Telephone Encounter (Signed)
, °

## 2013-09-30 NOTE — Addendum Note (Signed)
Addended by: Crecencio Mc on: 09/30/2013 12:25 PM   Modules accepted: Orders

## 2013-10-13 ENCOUNTER — Ambulatory Visit: Payer: Medicare Other | Admitting: Oncology

## 2013-10-13 ENCOUNTER — Other Ambulatory Visit: Payer: Medicare Other | Admitting: Lab

## 2013-10-28 ENCOUNTER — Ambulatory Visit: Payer: Self-pay | Admitting: Internal Medicine

## 2013-10-31 ENCOUNTER — Telehealth: Payer: Self-pay | Admitting: Oncology

## 2013-10-31 ENCOUNTER — Other Ambulatory Visit (HOSPITAL_BASED_OUTPATIENT_CLINIC_OR_DEPARTMENT_OTHER): Payer: Medicare Other | Admitting: Lab

## 2013-10-31 ENCOUNTER — Ambulatory Visit (HOSPITAL_BASED_OUTPATIENT_CLINIC_OR_DEPARTMENT_OTHER): Payer: Medicare Other | Admitting: Oncology

## 2013-10-31 VITALS — BP 141/75 | HR 85 | Temp 98.3°F | Resp 18 | Ht 66.5 in | Wt 174.5 lb

## 2013-10-31 DIAGNOSIS — Z170 Estrogen receptor positive status [ER+]: Secondary | ICD-10-CM

## 2013-10-31 DIAGNOSIS — C50519 Malignant neoplasm of lower-outer quadrant of unspecified female breast: Secondary | ICD-10-CM

## 2013-10-31 DIAGNOSIS — C50512 Malignant neoplasm of lower-outer quadrant of left female breast: Secondary | ICD-10-CM

## 2013-10-31 LAB — COMPREHENSIVE METABOLIC PANEL (CC13)
AST: 30 U/L (ref 5–34)
Albumin: 4 g/dL (ref 3.5–5.0)
Anion Gap: 9 mEq/L (ref 3–11)
BUN: 28 mg/dL — ABNORMAL HIGH (ref 7.0–26.0)
CO2: 27 mEq/L (ref 22–29)
Calcium: 10.2 mg/dL (ref 8.4–10.4)
Chloride: 107 mEq/L (ref 98–109)
Creatinine: 1.2 mg/dL — ABNORMAL HIGH (ref 0.6–1.1)
Glucose: 129 mg/dl (ref 70–140)
Potassium: 4.6 mEq/L (ref 3.5–5.1)
Sodium: 142 mEq/L (ref 136–145)

## 2013-10-31 LAB — CBC WITH DIFFERENTIAL/PLATELET
Basophils Absolute: 0.2 10*3/uL — ABNORMAL HIGH (ref 0.0–0.1)
EOS%: 2.5 % (ref 0.0–7.0)
Eosinophils Absolute: 0.3 10*3/uL (ref 0.0–0.5)
HCT: 39.6 % (ref 34.8–46.6)
HGB: 13.2 g/dL (ref 11.6–15.9)
MONO#: 0.8 10*3/uL (ref 0.1–0.9)
NEUT#: 6.8 10*3/uL — ABNORMAL HIGH (ref 1.5–6.5)
NEUT%: 49.8 % (ref 38.4–76.8)
RDW: 13.3 % (ref 11.2–14.5)
WBC: 13.6 10*3/uL — ABNORMAL HIGH (ref 3.9–10.3)
lymph#: 5.5 10*3/uL — ABNORMAL HIGH (ref 0.9–3.3)

## 2013-10-31 NOTE — Progress Notes (Signed)
Patient ID: ADDYSIN Love, female   DOB: January 11, 1934, 79 y.o.   MRN: [id number] ID: Haley Love   DOB: 1934/11/09  MR#: [id number]  [id number]   PCP:  Dr. Deborra Medina, [facility] in [place]  HISTORY OF PRESENT ILLNESS: Jaren had screening mammography at the [facility] 06/17/2012 which showed a possible mass in the left breast. Left diagnostic mammography performed 07/02/2012 showed a persistent 1 cm spiculated mass in the lateral left breast, which was palpable and firm. Ultrasound showed an irregular hypoechoic mass measuring 8 mm. The left axilla was unremarkable.  Biopsy of the mass the same day (SAA [id number]) showed an invasive ductal carcinoma, grade 1, estrogen receptor 100% progesterone receptor 39% positive, with no HER-2 amplification and an MIB-1 of 7%. Bilateral breast MRI were obtained 07/08/2012 this showed an irregular enhancing mass in the left breast, middle third, measuring 9 mm. There were no other suspicious masses in either breast and no axillary or internal mammary adenopathy.  The patient's subsequent history is as detailed below.  INTERVAL HISTORY: Haley Love returns today for followup of her breast cancer. She spent a month with her daughter in [place] for the holidays. While there she saw a chiropractor who was also a kinesiologist. He changed in many of her medications and the list as noted below is corrected. She tried the anastrozole briefly but she got the same cramps that she got with the tamoxifen. She is convinced that she is not going to be able to tolerate anti-estrogens.   REVIEW OF SYSTEMS: She still has some cramps, though she says they are better with orthotics. She has significant right knee pain which keeps her from walking for exercise. She has joint pain elsewhere, but this is very intermittent. In addition she was found to have some blood in her urine and what may be a polyp in her bladder. She is scheduled to see a urologist in [place] within the  next week. A detailed review of systems today was otherwise stable  PAST MEDICAL HISTORY: Past Medical History  Diagnosis Date  . Breast cancer   . Hypertension   . Contact lens/glasses fitting   . Hyperlipidemia   . Inflammatory polyps of colon   . Cataract     PAST SURGICAL HISTORY: Past Surgical History  Procedure Laterality Date  . No past surgeries    . Breast surgery  9/13    lt lump  . Mastectomy, partial  08/06/2012    Procedure: MASTECTOMY PARTIAL;  Surgeon: Adin Hector, MD;  Location: [facility];  Service: General;  Laterality: Left;  Left partial mastectomy with re-excision of margins  . Cataracts  2014  . Knee arthroscopy w/ meniscal repair  09/23/11    Right    FAMILY HISTORY Family History  Problem Relation Age of Onset  . Lung cancer Mother   . Arthritis Mother   . Hyperlipidemia Mother   . Hypertension Mother   . Throat cancer Father   . Hyperlipidemia Father   . Cancer Paternal Aunt     breast   the patient's father died at the age of 87 from cancer of the throat which had been diagnosed 6 years earlier. The patient's mother died at the age of 37. She had cancer of the lung lining diagnosed at age 71. The patient had 4 brothers, no sisters. There is no other cancer history in the immediate family.  GYNECOLOGIC HISTORY: Menarche age 79. Change of life age 79. She took hormone replacement  approximately 10 years. She is GX P2, with first live birth at age 79.  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 She is divorced. Her son Haley Love (goes by "J.") also lives in [place] and is self-employed running a Education administrator business. Daughter Haley Love lives in [place]. She works as a Electrical engineer. The patient has 3 grandchildren. She attends a [employer].  ADVANCED DIRECTIVES: In place  HEALTH MAINTENANCE: History  Substance Use Topics  . Smoking status: Never Smoker   .  Smokeless tobacco: Never Used  . Alcohol Use: Yes     Comment: 1 glass wine week     Colonoscopy: 2012  PAP: 2011  Bone density: 2011  Lipid panel:  No Known Allergies   Filed Vitals:   10/31/13 1033  BP: 141/75  Pulse: 85  Temp: 98.3 F (36.8 C)  Resp: 18     Body mass index is 27.75 kg/(m^2).    ECOG FS: 1 Filed Weights   10/31/13 1033  Weight: 174 lb 8 oz (79.153 kg)   Older white woman who appears stated age Sclerae unicteric, pupils round and equal Oropharynx no thrush or other lesions No cervical or supraclavicular adenopathy Lungs no rales or rhonchi Heart regular rate and rhythm Abdomen is soft, nontender, with positive bowel sounds MSK no focal spinal tenderness, no upper extremity lymphedema Neuro: nonfocal, well oriented, pleasant affect Breasts: The right breast is unremarkable. The left breast is status post lumpectomy. There is no evidence of local recurrence. Axillae are benign bilaterally   LAB RESULTS: Lab Results  Component Value Date   WBC 13.6* 10/31/2013   NEUTROABS 6.8* 10/31/2013   HGB 13.2 10/31/2013   HCT 39.6 10/31/2013   MCV 91.3 10/31/2013   PLT 277 10/31/2013      Chemistry      Component Value Date/Time   NA 137 09/22/2013 0938   NA 141 06/21/2013 1144   K 4.2 09/22/2013 0938   K 4.5 06/21/2013 1144   CL 102 09/22/2013 0938   CL 105 09/08/2012 1002   CO2 25 09/22/2013 0938   CO2 25 06/21/2013 1144   BUN 28* 09/22/2013 0938   BUN 19.7 06/21/2013 1144   CREATININE 1.3* 09/22/2013 0938   CREATININE 1.0 06/21/2013 1144      Component Value Date/Time   CALCIUM 9.8 09/22/2013 0938   CALCIUM 9.9 06/21/2013 1144   ALKPHOS 50 09/22/2013 0938   ALKPHOS 60 06/21/2013 1144   AST 32 09/22/2013 0938   AST 34 06/21/2013 1144   ALT 37* 09/22/2013 0938   ALT 40 06/21/2013 1144   BILITOT 0.8 09/22/2013 0938   BILITOT 0.31 06/21/2013 1144       Lab Results  Component Value Date   LABCA2 35 07/14/2012     STUDIES: No results found. Next  mammography is due July 2015   ASSESSMENT: 79 y.o. [name], [place] woman s/p Left lumpectomy 07/28/2012 for a pT1c NX, stage IA invasive ductal carcinoma, grade 1, estrogen receptor 100% and progesterone receptor 39% positive, with no HER-2 amplification and an MIB-1 of 7.   (1) initially positive margins were cleared 08/06/2012  (2) tamoxifen started October 2013, stopped March 2014 because of cramps and hip pain  (3) anastrozole started May 2014-- patient tells me she stopped after a few days "with the same symptoms as tamoxifen"   PLAN:  Haley Love is very reluctant to take anti-estrogens. We do have exemestane and letrozole,  which she has not had, but both she feels she is going to have the same side effects as she has had with the prior to the estrogens had what she wants to do is to continue her alternative and vitamin therapies..  Accordingly we are going to follow her off treatment. The adjuvant! Program would quote her a risk of relapse of approximately 14%. This includes in the breast recurrence. Note however that she decided to forego radiation in preference to try an anti-estrogens. Her risk of dying from this cancer is less than 3%.   She will have her next mammogram in July of 2015. She will see [place] next in August. After that we will see her on a once a year basis until she completes her 5 years of followup.  Haley Love is "delighted" with this plan. She knows to call for any problems that may develop before her next visit here  [place]    10/31/2013

## 2013-10-31 NOTE — Telephone Encounter (Signed)
gv pt appt schedule for August 2015. per 12/8 pof January 2015 appts cx'd. pof did not cross over to schedulling

## 2013-11-01 ENCOUNTER — Telehealth: Payer: Self-pay | Admitting: Internal Medicine

## 2013-11-01 NOTE — Telephone Encounter (Signed)
Ultrasound of gallbladder and liver was normal

## 2013-11-01 NOTE — Telephone Encounter (Signed)
Called and advised patient of results

## 2013-11-03 NOTE — Addendum Note (Signed)
Addended by: Laureen Abrahams on: 11/03/2013 05:26 PM   Modules accepted: Orders

## 2013-11-21 ENCOUNTER — Encounter: Payer: Self-pay | Admitting: Internal Medicine

## 2013-12-13 ENCOUNTER — Other Ambulatory Visit: Payer: Medicare Other

## 2013-12-20 ENCOUNTER — Ambulatory Visit: Payer: Medicare Other | Admitting: Oncology

## 2014-01-13 ENCOUNTER — Other Ambulatory Visit: Payer: Self-pay | Admitting: Internal Medicine

## 2014-01-16 ENCOUNTER — Telehealth: Payer: Self-pay | Admitting: Internal Medicine

## 2014-01-16 MED ORDER — LISINOPRIL-HYDROCHLOROTHIAZIDE 10-12.5 MG PO TABS: 1.0000 | ORAL_TABLET | Freq: Every day | ORAL | Status: DC

## 2014-01-16 NOTE — Telephone Encounter (Signed)
The patient states she takes 1 pill a day of this medication not a half . Please send this medication to the pharmacy with her new instructions per the patient.  She is out of her medication .  lisinopril-hydrochlorothiazide (PRINZIDE,ZESTORETIC) 10-12.5 MG per tablet

## 2014-01-16 NOTE — Telephone Encounter (Signed)
Ok to refill,  Refill sent 90 days 1 tablet daily

## 2014-01-16 NOTE — Telephone Encounter (Signed)
It looks like she had been on 1 tablet daily, ok to send Rx?

## 2014-05-17 ENCOUNTER — Other Ambulatory Visit: Payer: Self-pay | Admitting: Oncology

## 2014-05-17 DIAGNOSIS — Z853 Personal history of malignant neoplasm of breast: Secondary | ICD-10-CM

## 2014-05-24 HISTORY — PX: JOINT REPLACEMENT: SHX530

## 2014-05-31 ENCOUNTER — Ambulatory Visit: Payer: Self-pay | Admitting: General Practice

## 2014-05-31 LAB — CBC
HCT: 41.7 % (ref 35.0–47.0)
HGB: 13.5 g/dL (ref 12.0–16.0)
MCH: 28.9 pg (ref 26.0–34.0)
MCHC: 32.4 g/dL (ref 32.0–36.0)
MCV: 89 fL (ref 80–100)
Platelet: 239 10*3/uL (ref 150–440)
RBC: 4.68 10*6/uL (ref 3.80–5.20)
RDW: 13.2 % (ref 11.5–14.5)
WBC: 12.7 10*3/uL — ABNORMAL HIGH (ref 3.6–11.0)

## 2014-05-31 LAB — BASIC METABOLIC PANEL
Anion Gap: 8 (ref 7–16)
BUN: 31 mg/dL — ABNORMAL HIGH (ref 7–18)
CHLORIDE: 103 mmol/L (ref 98–107)
Calcium, Total: 9.7 mg/dL (ref 8.5–10.1)
Co2: 28 mmol/L (ref 21–32)
Creatinine: 1.2 mg/dL (ref 0.60–1.30)
EGFR (African American): 50 — ABNORMAL LOW
GFR CALC NON AF AMER: 44 — AB
Glucose: 123 mg/dL — ABNORMAL HIGH (ref 65–99)
OSMOLALITY: 285 (ref 275–301)
Potassium: 3.9 mmol/L (ref 3.5–5.1)
Sodium: 139 mmol/L (ref 136–145)

## 2014-05-31 LAB — URINALYSIS, COMPLETE
BILIRUBIN, UR: NEGATIVE
Blood: NEGATIVE
Glucose,UR: NEGATIVE mg/dL (ref 0–75)
Ketone: NEGATIVE
Nitrite: NEGATIVE
PROTEIN: NEGATIVE
Ph: 5 (ref 4.5–8.0)
RBC,UR: 1 /HPF (ref 0–5)
Specific Gravity: 1.017 (ref 1.003–1.030)
WBC UR: 14 /HPF (ref 0–5)

## 2014-05-31 LAB — PROTIME-INR
INR: 1
Prothrombin Time: 13.3 secs (ref 11.5–14.7)

## 2014-05-31 LAB — SEDIMENTATION RATE: Erythrocyte Sed Rate: 9 mm/hr (ref 0–30)

## 2014-05-31 LAB — APTT: ACTIVATED PTT: 28.9 s (ref 23.6–35.9)

## 2014-05-31 LAB — MRSA PCR SCREENING

## 2014-06-01 LAB — URINE CULTURE

## 2014-06-14 ENCOUNTER — Inpatient Hospital Stay: Payer: Self-pay | Admitting: General Practice

## 2014-06-15 LAB — BASIC METABOLIC PANEL
Anion Gap: 7 (ref 7–16)
BUN: 19 mg/dL — ABNORMAL HIGH (ref 7–18)
CREATININE: 1.05 mg/dL (ref 0.60–1.30)
Calcium, Total: 8.3 mg/dL — ABNORMAL LOW (ref 8.5–10.1)
Chloride: 103 mmol/L (ref 98–107)
Co2: 28 mmol/L (ref 21–32)
GFR CALC AF AMER: 59 — AB
GFR CALC NON AF AMER: 51 — AB
GLUCOSE: 138 mg/dL — AB (ref 65–99)
Osmolality: 280 (ref 275–301)
Potassium: 4.3 mmol/L (ref 3.5–5.1)
Sodium: 138 mmol/L (ref 136–145)

## 2014-06-15 LAB — PLATELET COUNT: PLATELETS: 246 10*3/uL (ref 150–440)

## 2014-06-15 LAB — HEMOGLOBIN: HGB: 12.2 g/dL (ref 12.0–16.0)

## 2014-06-16 LAB — BASIC METABOLIC PANEL
Anion Gap: 8 (ref 7–16)
BUN: 11 mg/dL (ref 7–18)
CALCIUM: 9 mg/dL (ref 8.5–10.1)
CO2: 25 mmol/L (ref 21–32)
Chloride: 103 mmol/L (ref 98–107)
Creatinine: 1.02 mg/dL (ref 0.60–1.30)
EGFR (African American): >60
GFR CALC NON AF AMER: 53 — AB
Glucose: 129 mg/dL — ABNORMAL HIGH (ref 65–99)
OSMOLALITY: 273 (ref 275–301)
POTASSIUM: 4.5 mmol/L (ref 3.5–5.1)
Sodium: 136 mmol/L (ref 136–145)

## 2014-06-16 LAB — HEMOGLOBIN: HGB: 11.5 g/dL — ABNORMAL LOW (ref 12.0–16.0)

## 2014-06-16 LAB — PLATELET COUNT: Platelet: 224 10*3/uL (ref 150–440)

## 2014-06-18 ENCOUNTER — Encounter: Payer: Self-pay | Admitting: Internal Medicine

## 2014-06-24 ENCOUNTER — Encounter: Payer: Self-pay | Admitting: Internal Medicine

## 2014-07-11 ENCOUNTER — Other Ambulatory Visit: Payer: Self-pay | Admitting: *Deleted

## 2014-07-12 ENCOUNTER — Telehealth: Payer: Self-pay | Admitting: Nurse Practitioner

## 2014-07-12 NOTE — Telephone Encounter (Signed)
Lft msg for pt labs/ov per 08/18 POF, mailed sch to pt...KJ

## 2014-07-13 ENCOUNTER — Other Ambulatory Visit: Payer: Medicare Other

## 2014-07-13 ENCOUNTER — Ambulatory Visit: Payer: Medicare Other | Admitting: Nurse Practitioner

## 2014-07-17 ENCOUNTER — Other Ambulatory Visit: Payer: Self-pay | Admitting: Oncology

## 2014-07-17 ENCOUNTER — Other Ambulatory Visit: Payer: Self-pay

## 2014-07-17 DIAGNOSIS — Z853 Personal history of malignant neoplasm of breast: Secondary | ICD-10-CM

## 2014-07-20 ENCOUNTER — Ambulatory Visit
Admission: RE | Admit: 2014-07-20 | Discharge: 2014-07-20 | Disposition: A | Discharge status: Home / Self Care | Payer: Commercial Managed Care - HMO | Source: Ambulatory Visit | Attending: Oncology | Admitting: Oncology

## 2014-07-20 DIAGNOSIS — Z853 Personal history of malignant neoplasm of breast: Secondary | ICD-10-CM

## 2014-07-24 ENCOUNTER — Other Ambulatory Visit: Payer: Self-pay | Admitting: *Deleted

## 2014-07-24 DIAGNOSIS — C50512 Malignant neoplasm of lower-outer quadrant of left female breast: Secondary | ICD-10-CM

## 2014-07-25 ENCOUNTER — Other Ambulatory Visit (HOSPITAL_BASED_OUTPATIENT_CLINIC_OR_DEPARTMENT_OTHER): Payer: Commercial Managed Care - HMO

## 2014-07-25 ENCOUNTER — Encounter: Payer: Self-pay | Admitting: Internal Medicine

## 2014-07-25 ENCOUNTER — Encounter: Payer: Self-pay | Admitting: Nurse Practitioner

## 2014-07-25 ENCOUNTER — Telehealth: Payer: Self-pay | Admitting: Nurse Practitioner

## 2014-07-25 ENCOUNTER — Ambulatory Visit (HOSPITAL_BASED_OUTPATIENT_CLINIC_OR_DEPARTMENT_OTHER): Payer: Commercial Managed Care - HMO | Admitting: Nurse Practitioner

## 2014-07-25 VITALS — BP 147/62 | HR 102 | Temp 98.5°F | Resp 18 | Ht 66.5 in | Wt 170.4 lb

## 2014-07-25 DIAGNOSIS — C50519 Malignant neoplasm of lower-outer quadrant of unspecified female breast: Secondary | ICD-10-CM

## 2014-07-25 DIAGNOSIS — Z170 Estrogen receptor positive status [ER+]: Secondary | ICD-10-CM

## 2014-07-25 DIAGNOSIS — C50512 Malignant neoplasm of lower-outer quadrant of left female breast: Secondary | ICD-10-CM

## 2014-07-25 LAB — CBC WITH DIFFERENTIAL/PLATELET
BASO%: 0.3 % (ref 0.0–2.0)
BASOS ABS: 0 10*3/uL (ref 0.0–0.1)
EOS%: 1.7 % (ref 0.0–7.0)
Eosinophils Absolute: 0.2 10*3/uL (ref 0.0–0.5)
HCT: 39.9 % (ref 34.8–46.6)
HGB: 12.9 g/dL (ref 11.6–15.9)
LYMPH#: 5.6 10*3/uL — AB (ref 0.9–3.3)
LYMPH%: 42 % (ref 14.0–49.7)
MCH: 29.2 pg (ref 25.1–34.0)
MCHC: 32.3 g/dL (ref 31.5–36.0)
MCV: 90.3 fL (ref 79.5–101.0)
MONO#: 0.7 10*3/uL (ref 0.1–0.9)
MONO%: 5.3 % (ref 0.0–14.0)
NEUT#: 6.7 10*3/uL — ABNORMAL HIGH (ref 1.5–6.5)
NEUT%: 50.7 % (ref 38.4–76.8)
Platelets: 302 10*3/uL (ref 145–400)
RBC: 4.42 10*6/uL (ref 3.70–5.45)
RDW: 12.9 % (ref 11.2–14.5)
WBC: 13.3 10*3/uL — ABNORMAL HIGH (ref 3.9–10.3)

## 2014-07-25 LAB — COMPREHENSIVE METABOLIC PANEL (CC13)
ALBUMIN: 3.9 g/dL (ref 3.5–5.0)
ALK PHOS: 65 U/L (ref 40–150)
ALT: 24 U/L (ref 0–55)
AST: 25 U/L (ref 5–34)
Anion Gap: 9 mEq/L (ref 3–11)
BUN: 26.3 mg/dL — AB (ref 7.0–26.0)
CALCIUM: 10.4 mg/dL (ref 8.4–10.4)
CO2: 27 mEq/L (ref 22–29)
CREATININE: 1.2 mg/dL — AB (ref 0.6–1.1)
Chloride: 102 mEq/L (ref 98–109)
Glucose: 119 mg/dl (ref 70–140)
POTASSIUM: 4.9 meq/L (ref 3.5–5.1)
Sodium: 139 mEq/L (ref 136–145)
Total Bilirubin: 0.35 mg/dL (ref 0.20–1.20)
Total Protein: 7.5 g/dL (ref 6.4–8.3)

## 2014-07-25 LAB — TECHNOLOGIST REVIEW

## 2014-07-25 NOTE — Progress Notes (Signed)
ID: Haley Love   DOB: 07-28-1934  MR#: [id number]  WSF#:[id number]   PCP:  Dr. Deborra Medina, [facility] in [place]: left breast cancer  CURRENT THERAPY: none, observation  BREAST CANCER HISTORY: Haley Love [facility] which showed a possible mass in the left breast. Left diagnostic mammography performed 07/02/2012 showed a persistent 1 cm spiculated mass in the lateral left breast, which was palpable and firm. Ultrasound showed an irregular hypoechoic mass measuring 8 mm. The left axilla was unremarkable.  Biopsy of the mass the same day (SAA [id number]) showed an invasive ductal carcinoma, grade 1, estrogen receptor 100% progesterone receptor 39% positive, with no HER-2 amplification and an MIB-1 of 7%. Bilateral breast MRI were obtained 07/08/2012 this showed an irregular enhancing mass in the left breast, middle third, measuring 9 mm. There were no other suspicious masses in either breast and no axillary or internal mammary adenopathy.  The patient's subsequent history is as detailed below.  INTERVAL HISTORY: Haley Love returns today for follow up of her breast cancer. The interval history is significant for a right knee replacement that occurred 6 weeks ago. She states she is healing well and goes to physical therapy weekly. She only takes her pain medicine on PT days, otherwise it is managed by NSAIDS.  REVIEW OF SYSTEMS: Haley Love denies fevers, chills, nausea, vomiting, or bowel changes. She also denies shortness of breath, chest pain, or fatigue. A detailed review of systems was negative except where noted above.    PAST MEDICAL HISTORY: Past Medical History  Diagnosis Date  . Breast cancer   . Hypertension   . Contact lens/glasses fitting   . Hyperlipidemia   . Inflammatory polyps of colon   . Cataract     PAST SURGICAL HISTORY: Past Surgical History  Procedure Laterality Date  . No past surgeries    . Breast surgery  9/13     lt lump  . Mastectomy, partial  08/06/2012    Procedure: MASTECTOMY PARTIAL;  Surgeon: Adin Hector, MD;  Location: [facility];  Service: General;  Laterality: Left;  Left partial mastectomy with re-excision of margins  . Cataracts  2014  . Knee arthroscopy w/ meniscal repair  09/23/11    Right  Right knee replacement in late July 2015.  FAMILY HISTORY Family History  Problem Relation Age of Onset  . Lung cancer Mother   . Arthritis Mother   . Hyperlipidemia Mother   . Hypertension Mother   . Throat cancer Father   . Hyperlipidemia Father   . Cancer Paternal Aunt     breast   the patient's father died at the age of 74 from cancer of the throat which had been diagnosed 6 years earlier. The patient's mother died at the age of 39. She had cancer of the lung lining diagnosed at age 13. The patient had 4 brothers, no sisters. There is no other cancer history in the immediate family.  GYNECOLOGIC HISTORY: Menarche age 39. Change of life age 26. She took hormone replacement approximately 10 years. She is GX P2, with first live birth at age 15.  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 She is divorced. Her son Haley Love (goes by "J.") also lives in [place] and is self-employed running a Education administrator business. Daughter Haley Love lives in [place]. She works as a Electrical engineer. The patient has 3 grandchildren. 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  ADVANCED DIRECTIVES: In place  HEALTH MAINTENANCE: History  Substance Use Topics  . Smoking status: Never Smoker   . Smokeless tobacco: Never Used  . Alcohol Use: Yes     Comment: 1 glass wine week     Colonoscopy: 2012  PAP: 2011  Bone density: 2011  Lipid panel:  No Known Allergies   Filed Vitals:   07/25/14 1135  BP: 147/62  Pulse: 102  Temp: 98.5 F (36.9 C)  Resp: 18     Body mass index is 27.09 kg/(m^2).    ECOG FS: 0 Filed Weights   07/25/14 1135   Weight: 170 lb 6.4 oz (77.293 kg)   Older white woman who appears stated age Skin: warm, dry  HEENT: sclerae anicteric, conjunctivae pink, oropharynx clear. No thrush or mucositis.  Lymph Nodes: No cervical or supraclavicular lymphadenopathy  Lungs: clear to auscultation bilaterally, no rales, wheezes, or rhonci  Heart: regular rate and rhythm  Abdomen: round, soft, non tender, positive bowel sounds  Musculoskeletal: No focal spinal tenderness, no peripheral edema, right knee has limited range of motion, right knee replacement scar clean, dry, intact, and well approximated Neuro: non focal, well oriented, positive affect  Breast: left breast status post lumpectomy. No evidence of disease recurrence. Left axilla benign. Right breast unremarkable.   LAB RESULTS: Lab Results  Component Value Date   WBC 13.3* 07/25/2014   NEUTROABS 6.7* 07/25/2014   HGB 12.9 07/25/2014   HCT 39.9 07/25/2014   MCV 90.3 07/25/2014   PLT 302 07/25/2014      Chemistry      Component Value Date/Time   NA 139 07/25/2014 1029   NA 137 09/22/2013 0938   K 4.9 07/25/2014 1029   K 4.2 09/22/2013 0938   CL 102 09/22/2013 0938   CL 105 09/08/2012 1002   CO2 27 07/25/2014 1029   CO2 25 09/22/2013 0938   BUN 26.3* 07/25/2014 1029   BUN 28* 09/22/2013 0938   CREATININE 1.2* 07/25/2014 1029   CREATININE 1.3* 09/22/2013 0938      Component Value Date/Time   CALCIUM 10.4 07/25/2014 1029   CALCIUM 9.8 09/22/2013 0938   ALKPHOS 65 07/25/2014 1029   ALKPHOS 50 09/22/2013 0938   AST 25 07/25/2014 1029   AST 32 09/22/2013 0938   ALT 24 07/25/2014 1029   ALT 37* 09/22/2013 0938   BILITOT 0.35 07/25/2014 1029   BILITOT 0.8 09/22/2013 0938       Lab Results  Component Value Date   LABCA2 35 07/14/2012     STUDIES: Most recent mammogram on 8/27/*15 was unremarkable  ASSESSMENT: 80 y.o. Haley Love, [place] woman s/p Left lumpectomy 07/28/2012 for a pT1c NX, stage IA invasive ductal carcinoma, grade 1, estrogen receptor 100% and progesterone  receptor 39% positive, with no HER-2 amplification and an MIB-1 of 7.   (1) initially positive margins were cleared 08/06/2012  (2) tamoxifen started October 2013, stopped March 2014 because of cramps and hip pain  (3) anastrozole started May 2014-- patient tells me she stopped after a few days "with the same symptoms as tamoxifen"  (4) patient chooses to forego anti-estrogen therapy altogether, she will follow up in office until she is 5 years out from her definitive surgery.   PLAN:  Onica is doing well as far as her breast cancer is concerned. The labs were reviewed in detail with her and were stable. She will continue to follow up with physical therapy and her surgeon for her post-knee replacement  care.   Patsy will have her next mammogram in August 2015. She will return for labs and an office visit next Sept, and will continue to see [place] yearly until she is 5 years post op. Hayslee understands and agrees with this plan. She has been encouraged to call with any issues that might arise before her next visit here.   Haley Love    07/25/2014

## 2014-07-25 NOTE — Telephone Encounter (Signed)
per pof to sch pt appt-gave pt copy of sch °

## 2014-08-10 ENCOUNTER — Encounter: Payer: Self-pay | Admitting: Internal Medicine

## 2014-08-10 ENCOUNTER — Other Ambulatory Visit: Payer: Self-pay | Admitting: Internal Medicine

## 2014-08-10 ENCOUNTER — Ambulatory Visit (INDEPENDENT_AMBULATORY_CARE_PROVIDER_SITE_OTHER): Payer: Commercial Managed Care - HMO | Admitting: Internal Medicine

## 2014-08-10 VITALS — BP 118/64 | HR 100 | Temp 98.1°F | Resp 16 | Ht 66.25 in | Wt 169.5 lb

## 2014-08-10 DIAGNOSIS — D72829 Elevated white blood cell count, unspecified: Secondary | ICD-10-CM

## 2014-08-10 DIAGNOSIS — E785 Hyperlipidemia, unspecified: Secondary | ICD-10-CM

## 2014-08-10 DIAGNOSIS — R778 Other specified abnormalities of plasma proteins: Secondary | ICD-10-CM

## 2014-08-10 DIAGNOSIS — R5381 Other malaise: Secondary | ICD-10-CM

## 2014-08-10 DIAGNOSIS — Z1159 Encounter for screening for other viral diseases: Secondary | ICD-10-CM

## 2014-08-10 DIAGNOSIS — R5383 Other fatigue: Secondary | ICD-10-CM

## 2014-08-10 DIAGNOSIS — Z96651 Presence of right artificial knee joint: Secondary | ICD-10-CM

## 2014-08-10 DIAGNOSIS — R799 Abnormal finding of blood chemistry, unspecified: Secondary | ICD-10-CM

## 2014-08-10 DIAGNOSIS — N189 Chronic kidney disease, unspecified: Secondary | ICD-10-CM

## 2014-08-10 DIAGNOSIS — N183 Chronic kidney disease, stage 3 unspecified: Secondary | ICD-10-CM

## 2014-08-10 DIAGNOSIS — R7309 Other abnormal glucose: Secondary | ICD-10-CM

## 2014-08-10 DIAGNOSIS — D472 Monoclonal gammopathy: Secondary | ICD-10-CM

## 2014-08-10 DIAGNOSIS — N039 Chronic nephritic syndrome with unspecified morphologic changes: Secondary | ICD-10-CM

## 2014-08-10 DIAGNOSIS — D631 Anemia in chronic kidney disease: Secondary | ICD-10-CM

## 2014-08-10 DIAGNOSIS — Z0000 Encounter for general adult medical examination without abnormal findings: Secondary | ICD-10-CM

## 2014-08-10 DIAGNOSIS — K635 Polyp of colon: Secondary | ICD-10-CM

## 2014-08-10 DIAGNOSIS — D126 Benign neoplasm of colon, unspecified: Secondary | ICD-10-CM

## 2014-08-10 DIAGNOSIS — R739 Hyperglycemia, unspecified: Secondary | ICD-10-CM

## 2014-08-10 DIAGNOSIS — Z96659 Presence of unspecified artificial knee joint: Secondary | ICD-10-CM

## 2014-08-10 LAB — BASIC METABOLIC PANEL
BUN: 30 mg/dL — ABNORMAL HIGH (ref 6–23)
CO2: 27 meq/L (ref 19–32)
Calcium: 10.2 mg/dL (ref 8.4–10.5)
Chloride: 103 mEq/L (ref 96–112)
Creatinine, Ser: 1.4 mg/dL — ABNORMAL HIGH (ref 0.4–1.2)
GFR: 40.34 mL/min — AB (ref >60.00)
GLUCOSE: 120 mg/dL — AB (ref 70–99)
Potassium: 4.7 mEq/L (ref 3.5–5.1)
Sodium: 137 mEq/L (ref 135–145)

## 2014-08-10 LAB — CBC WITH DIFFERENTIAL/PLATELET
BASOS ABS: 0 10*3/uL (ref 0.0–0.1)
Basophils Relative: 0.3 % (ref 0.0–3.0)
EOS ABS: 0.2 10*3/uL (ref 0.0–0.7)
Eosinophils Relative: 1.4 % (ref 0.0–5.0)
HCT: 41.9 % (ref 36.0–46.0)
Hemoglobin: 14 g/dL (ref 12.0–15.0)
LYMPHS PCT: 43.6 % (ref 12.0–46.0)
Lymphs Abs: 6.6 10*3/uL — ABNORMAL HIGH (ref 0.7–4.0)
MCHC: 33.5 g/dL (ref 30.0–36.0)
MCV: 88.6 fl (ref 78.0–100.0)
MONOS PCT: 5.8 % (ref 3.0–12.0)
Monocytes Absolute: 0.9 10*3/uL (ref 0.1–1.0)
Neutro Abs: 7.4 10*3/uL (ref 1.4–7.7)
Neutrophils Relative %: 48.9 % (ref 43.0–77.0)
PLATELETS: 307 10*3/uL (ref 150.0–400.0)
RBC: 4.73 Mil/uL (ref 3.87–5.11)
RDW: 13.3 % (ref 11.5–15.5)
WBC: 15.1 10*3/uL — ABNORMAL HIGH (ref 4.0–10.5)

## 2014-08-10 LAB — LIPID PANEL
CHOLESTEROL: 253 mg/dL — AB (ref 0–200)
HDL: 41.1 mg/dL (ref >39.00)
NonHDL: 211.9
Total CHOL/HDL Ratio: 6
Triglycerides: 293 mg/dL — ABNORMAL HIGH (ref 0.0–149.0)
VLDL: 58.6 mg/dL — ABNORMAL HIGH (ref 0.0–40.0)

## 2014-08-10 LAB — HEMOGLOBIN A1C: Hgb A1c MFr Bld: 6.3 % (ref 4.6–6.5)

## 2014-08-10 LAB — LDL CHOLESTEROL, DIRECT: Direct LDL: 176.1 mg/dL

## 2014-08-10 LAB — VITAMIN B12: VITAMIN B 12: 708 pg/mL (ref 211–911)

## 2014-08-10 NOTE — Progress Notes (Signed)
Patient ID: Haley Love, female   DOB: 01-06-1934, 80 y.o.   MRN: [id number]  The patient is here for annual Medicare wellness examination and management of other chronic and acute problems.   Last seen a year ago.  Since then has been referred to nephrology for CKD and underwent  A total knee replacement in the right 8 weeks ago by [facility] ,  Still in PT.  She has achieved knee flexion to 116 degrees ,  Goal is 120   She has been having increased fatigue with normal activity but denies  chest pain or shortness of breath.  She has been taking a b12 supplement daily   The risk factors are reflected in the social history.  The roster of all physicians providing medical care to patient - is listed in the Snapshot section of the chart.  Activities of daily living:  The patient is 100% independent in all ADLs: dressing, toileting, feeding as well as independent mobility  Home safety : The patient has smoke detectors in the home. They wear seatbelts.  There are no firearms at home. There is no violence in the home.   There is no risks for hepatitis, STDs or HIV. There is no   history of blood transfusion. They have no travel history to infectious disease endemic areas of the world.  The patient has seen their dentist in the last six month. They have seen their eye doctor in the last year. They admit to slight hearing difficulty with regard to whispered voices and some television programs.  They have deferred audiologic testing in the last year.  They do not  have excessive sun exposure. Discussed the need for sun protection: hats, long sleeves and use of sunscreen if there is significant sun exposure.   Diet: the importance of a healthy diet is discussed. They do have a healthy diet.  The benefits of regular aerobic exercise were discussed. She walks 4 times per week ,  20 minutes.   Depression screen: there are no signs or vegative symptoms of depression- irritability, change in appetite,  anhedonia, sadness/tearfullness.  Cognitive assessment: the patient manages all their financial and personal affairs and is actively engaged. They could relate day,date,year and events; recalled 2/3 objects at 3 minutes; performed clock-face test normally.  The following portions of the patient's history were reviewed and updated as appropriate: allergies, current medications, past family history, past medical history,  past surgical history, past social history  and problem list.  Visual acuity was not assessed per patient preference since she has regular follow up with her ophthalmologist. Hearing and body mass index were assessed and reviewed.   During the course of the visit the patient was educated and counseled about appropriate screening and preventive services including : fall prevention , diabetes screening, nutrition counseling, colorectal cancer screening, and recommended immunizations.    Objective:  BP 118/64  Pulse 100  Temp(Src) 98.1 F (36.7 C) (Oral)  Resp 16  Ht 5' 6.25" (1.683 m)  Wt 169 lb 8 oz (76.885 kg)  BMI 27.14 kg/m2  SpO2 98% General appearance: alert, cooperative and appears stated age Ears: normal TM's and external ear canals both ears Throat: lips, mucosa, and tongue normal; teeth and gums normal Neck: no adenopathy, no carotid bruit, supple, symmetrical, trachea midline and thyroid not enlarged, symmetric, no tenderness/mass/nodules Back: symmetric, no curvature. ROM normal. No CVA tenderness. Lungs: clear to auscultation bilaterally Heart: regular rate and rhythm, S1, S2 normal, no murmur, click, rub  or gallop Abdomen: soft, non-tender; bowel sounds normal; no masses,  no organomegaly Pulses: 2+ and symmetric Skin: Skin color, texture, turgor normal. No rashes or lesions Lymph nodes: Cervical, supraclavicular, and axillary nodes normal.  Assessment and Plan:  Anemia in chronic kidney disease  Repeat hgbis normal but persistent leukocytosis noted.    Lab Results  Component Value Date   WBC 15.1* 08/10/2014   HGB 14.0 08/10/2014   HCT 41.9 08/10/2014   MCV 88.6 08/10/2014   PLT 307.0 08/10/2014     S/P TKR (total knee replacement) Doing well,  Advised to avoid NSAIDs due to CKD  Encounter for Medicare annual wellness exam .Annual Medicare wellness  exam was done as well as a comprehensive physical exam and management of acute and chronic conditions .  During the course of the visit the patient was educated and counseled about appropriate screening and preventive services including : fall prevention , diabetes screening, nutrition counseling, colorectal cancer screening, and recommended immunizations.  Printed recommendations for health maintenance screenings was given.   CKD (chronic kidney disease), stage III Stable.  referral to  Nephrology was done last year and labs reviewed.  Cautioned to stop daily use of aleve and other NSAIDs .  Continue lisinopril. Lab Results  Component Value Date   CREATININE 1.4* 08/10/2014     Colon polyps Last scope was 2012. By Vira Agar with 5 yr follow up planned.   Hyperlipidemia with target LDL less than 100 Managed with RYR per patient preference.  Triglycerides have improved from 420 recently, LDL remains elevated.   She has no interest in starting a statin or fenofibrate at this time.    Lab Results  Component Value Date   CHOL 253* 08/10/2014   HDL 41.10 08/10/2014   LDLDIRECT 176.1 08/10/2014   TRIG 293.0* 08/10/2014   CHOLHDL 6 08/10/2014       Leukocytosis, unspecified Persistent per review of labs over the last year. Etiology unclear.  She is taking several "immunomodulating" natural supplements which I have asked her to suspend for two weeks and return for repeat CBC  Other malaise and fatigue Screening for DM, thyroid disorder,  B12 deficiency and anemia done and all labs normal.  Fatigue is likely due to recent joint replacement   Updated Medication List Outpatient Encounter  Prescriptions as of 08/10/2014  Medication Sig  . aspirin 81 MG tablet Take 81 mg by mouth daily.  Marland Kitchen b complex vitamins tablet Take 1 tablet by mouth daily.  . B Complex-C (RA B-COMPLEX/VITAMIN C CR) TBCR Take by mouth.  Chong Sicilian, Borago officinalis, (BORAGE OIL PO) Take 1,000 mg by mouth daily.  . Calcium Carbonate-Vit D-Min (RA CALCIUM 600/VIT D/MINERALS) 600-200 MG-UNIT TABS Take tablets by mouth once a day.  . Cholecalciferol (VITAMIN D-3) 1000 UNITS CAPS Take 1 capsule by mouth at bedtime.  Marland Kitchen CINNAMON PO Take 2,000 mg by mouth at bedtime.  . Coenzyme Q10 (CO Q-10) 200 MG CAPS Take 1 capsule by mouth at bedtime.  . Docosahexaenoic Acid (OMEGA-3 DHA) POWD Use.  . Flaxseed, Linseed, (FLAX SEED OIL) 1000 MG CAPS Take by mouth.  . Indole-3-Carbinol POWD Take 200 mg by mouth 2 (two) times daily.  Javier Docker Oil 1500 MG CAPS Take by mouth.  Marland Kitchen lisinopril-hydrochlorothiazide (PRINZIDE,ZESTORETIC) 10-12.5 MG per tablet Take 1 tablet by mouth daily.  . Magnesium 500 MG CAPS Take 1 capsule by mouth daily.   . Misc Natural Products (NARCOSOFT HERBAL LAX PO) Take 2 tablets by mouth at  bedtime.   . Nutritional Supplements (NUTRITIONAL SUPPLEMENT PO) Take 3 capsules by mouth 2 (two) times daily. Neprinol AFD>  . Red Yeast Rice Extract (RED YEAST RICE PO) Take 1,000 mg by mouth 2 (two) times daily.  . vitamin E 400 UNIT capsule Take 400 Units by mouth daily.  . [DISCONTINUED] diclofenac (VOLTAREN) 75 MG EC tablet   . [DISCONTINUED] Vitamin D, Ergocalciferol, (DRISDOL) 50000 UNITS CAPS capsule Take by mouth.  . TDaP (BOOSTRIX) 5-2.5-18.5 LF-MCG/0.5 injection Inject 0.5 mLs into the muscle once.  . [DISCONTINUED] HYDROcodone-acetaminophen (NORCO/VICODIN) 5-325 MG per tablet Take by mouth.  . [DISCONTINUED] UNABLE TO FIND DIM for estrogen metabolism

## 2014-08-10 NOTE — Progress Notes (Signed)
Pre-visit discussion using our clinic review tool. No additional management support is needed unless otherwise documented below in the visit note.  

## 2014-08-10 NOTE — Patient Instructions (Signed)
You have a persistently elevated white count.  I am rechecking it today and will have you stop your supplements for a week to see if it is the cause   We will call you with the results   You have chronic kidney insufficiency  (low filtering ability of the kideny) and should avoid taking  Naproxen ibuprofren Bactrim/Septra  Chronic Kidney Disease Chronic kidney disease occurs when the kidneys are damaged over a long period. The kidneys are two organs that lie on either side of the spine between the middle of the back and the front of the abdomen. The kidneys:   Remove wastes and extra water from the blood.   Produce important hormones. These help keep bones strong, regulate blood pressure, and help create red blood cells.   Balance the fluids and chemicals in the blood and tissues. A small amount of kidney damage may not cause problems, but a large amount of damage may make it difficult or impossible for the kidneys to work the way they should. If steps are not taken to slow down the kidney damage or stop it from getting worse, the kidneys may stop working permanently. Most of the time, chronic kidney disease does not go away. However, it can often be controlled, and those with the disease can usually live normal lives. CAUSES  The most common causes of chronic kidney disease are diabetes and high blood pressure (hypertension). Chronic kidney disease may also be caused by:   Diseases that cause the kidneys' filters to become inflamed.   Diseases that affect the immune system.   Genetic diseases.   Medicines that damage the kidneys, such as anti-inflammatory medicines.  Poisoning or exposure to toxic substances.   A reoccurring kidney or urinary infection.   A problem with urine flow. This may be caused by:   Cancer.   Kidney stones.   An enlarged prostate in males. SIGNS AND SYMPTOMS  Because the kidney damage in chronic kidney disease occurs slowly, symptoms  develop slowly and may not be obvious until the kidney damage becomes severe. A person may have a kidney disease for years without showing any symptoms. Symptoms can include:   Swelling (edema) of the legs, ankles, or feet.   Tiredness (lethargy).   Nausea or vomiting.   Confusion.   Problems with urination, such as:   Decreased urine production.   Frequent urination, especially at night.   Frequent accidents in children who are potty trained.   Muscle twitches and cramps.   Shortness of breath.  Weakness.   Persistent itchiness.   Loss of appetite.  Metallic taste in the mouth.  Trouble sleeping.  Slowed development in children.  Short stature in children. DIAGNOSIS  Chronic kidney disease may be detected and diagnosed by tests, including blood, urine, imaging, or kidney biopsy tests.  TREATMENT  Most chronic kidney diseases cannot be cured. Treatment usually involves relieving symptoms and preventing or slowing the progression of the disease. Treatment may include:   A special diet. You may need to avoid alcohol and foods thatare salty and high in potassium.   Medicines. These may:   Lower blood pressure.   Relieve anemia.   Relieve swelling.   Protect the bones. HOME CARE INSTRUCTIONS   Follow your prescribed diet.   Take medicines only as directed by your health care provider. Do not take any new medicines (prescription, over-the-counter, or nutritional supplements) unless approved by your health care provider. Many medicines can worsen your kidney damage or  need to have the dose adjusted.   Quit smoking if you smoke. Talk to your health care provider about a smoking cessation program.   Keep all follow-up visits as directed by your health care provider. SEEK IMMEDIATE MEDICAL CARE IF:  Your symptoms get worse or you develop new symptoms.   You develop symptoms of end-stage kidney disease. These include:   Headaches.    Abnormally dark or light skin.   Numbness in the hands or feet.   Easy bruising.   Frequent hiccups.   Menstruation stops.   You have a fever.   You have decreased urine production.   You havepain or bleeding when urinating. MAKE SURE YOU:  Understand these instructions.  Will watch your condition.  Will get help right away if you are not doing well or get worse. FOR MORE INFORMATION   American Association of Kidney Patients: BombTimer.gl  National Kidney Foundation: www.kidney.[place]: https://mathis.com/  [facility]: www.lifeoptions.org and www.kidneyschool.org Document Released: 08/19/2008 Document Revised: 03/27/2014 Document Reviewed: 07/09/2012 [facility] Patient Information 2015 [place], [place]. This information is not intended to replace advice given to you by your health care provider. Make sure you discuss any questions you have with your health care provider.

## 2014-08-10 NOTE — Assessment & Plan Note (Addendum)
Repeat hgbis normal but persistent leukocytosis noted.   Lab Results  Component Value Date   WBC 15.1* 08/10/2014   HGB 14.0 08/10/2014   HCT 41.9 08/10/2014   MCV 88.6 08/10/2014   PLT 307.0 08/10/2014

## 2014-08-11 LAB — FOLATE RBC: RBC Folate: 970 ng/mL (ref >280)

## 2014-08-11 LAB — HEPATITIS C ANTIBODY: HCV AB: NEGATIVE

## 2014-08-11 LAB — PTH, INTACT AND CALCIUM
CALCIUM: 9.9 mg/dL (ref 8.4–10.5)
PTH: 19 pg/mL (ref 14–64)

## 2014-08-13 ENCOUNTER — Encounter: Payer: Self-pay | Admitting: Internal Medicine

## 2014-08-13 DIAGNOSIS — D72820 Lymphocytosis (symptomatic): Secondary | ICD-10-CM | POA: Insufficient documentation

## 2014-08-13 DIAGNOSIS — R5381 Other malaise: Secondary | ICD-10-CM | POA: Insufficient documentation

## 2014-08-13 DIAGNOSIS — R5383 Other fatigue: Secondary | ICD-10-CM

## 2014-08-13 DIAGNOSIS — D72829 Elevated white blood cell count, unspecified: Secondary | ICD-10-CM | POA: Insufficient documentation

## 2014-08-13 DIAGNOSIS — N183 Chronic kidney disease, stage 3 unspecified: Secondary | ICD-10-CM | POA: Insufficient documentation

## 2014-08-13 NOTE — Assessment & Plan Note (Signed)
Managed with [facility] per patient preference.  Triglycerides have improved from 420 recently, LDL remains elevated.   She has no interest in starting a statin or fenofibrate at this time.    Lab Results  Component Value Date   CHOL 253* 08/10/2014   HDL 41.10 08/10/2014   LDLDIRECT 176.1 08/10/2014   TRIG 293.0* 08/10/2014   CHOLHDL 6 08/10/2014

## 2014-08-13 NOTE — Assessment & Plan Note (Signed)

## 2014-08-13 NOTE — Assessment & Plan Note (Signed)
Doing well,  Advised to avoid NSAIDs due to CKD

## 2014-08-13 NOTE — Assessment & Plan Note (Signed)
Screening for DM, thyroid disorder,  B12 deficiency and anemia done and all labs normal.  Fatigue is likely due to recent joint replacement

## 2014-08-13 NOTE — Assessment & Plan Note (Signed)
Last scope was 2012. By Vira Agar with 5 yr follow up planned.

## 2014-08-13 NOTE — Assessment & Plan Note (Addendum)
Persistent per review of labs over the last year. Etiology unclear.  She is taking several "immunomodulating" natural supplements which I have asked her to suspend for two weeks and return for repeat CBC

## 2014-08-13 NOTE — Assessment & Plan Note (Signed)
Stable.  referral to  Nephrology was done last year and labs reviewed.  Cautioned to stop daily use of aleve and other NSAIDs .  Continue lisinopril. Lab Results  Component Value Date   CREATININE 1.4* 08/10/2014

## 2014-08-14 LAB — PROTEIN ELECTROPHORESIS, SERUM
ALPHA-1-GLOBULIN: 3.4 % (ref 2.9–4.9)
ALPHA-2-GLOBULIN: 13 % — AB (ref 7.1–11.8)
Albumin ELP: 59.4 % (ref 55.8–66.1)
Beta 2: 4.5 % (ref 3.2–6.5)
Beta Globulin: 7.3 % — ABNORMAL HIGH (ref 4.7–7.2)
GAMMA GLOBULIN: 12.4 % (ref 11.1–18.8)
M-Spike, %: 0.45 g/dL
Total Protein, Serum Electrophoresis: 7.4 g/dL (ref 6.0–8.3)

## 2014-08-15 NOTE — Addendum Note (Signed)
Addended by: Crecencio Mc on: 08/15/2014 08:23 PM   Modules accepted: Orders

## 2014-08-16 ENCOUNTER — Telehealth: Payer: Self-pay | Admitting: Internal Medicine

## 2014-08-17 LAB — IMMUNOFIXATION ELECTROPHORESIS
IGM, SERUM: 69 mg/dL (ref 52–322)
IgA: 299 mg/dL (ref 69–380)
IgG (Immunoglobin G), Serum: 981 mg/dL (ref 690–1700)
TOTAL PROTEIN, SERUM ELECTROPHOR: 7.4 g/dL (ref 6.0–8.3)

## 2014-08-19 DIAGNOSIS — D472 Monoclonal gammopathy: Secondary | ICD-10-CM | POA: Insufficient documentation

## 2014-08-19 NOTE — Addendum Note (Signed)
Addended by: Crecencio Mc on: 08/19/2014 09:34 PM   Modules accepted: Orders

## 2014-08-29 ENCOUNTER — Ambulatory Visit (INDEPENDENT_AMBULATORY_CARE_PROVIDER_SITE_OTHER): Payer: Medicare Other | Admitting: General Surgery

## 2014-08-30 ENCOUNTER — Telehealth: Payer: Self-pay | Admitting: Internal Medicine

## 2014-08-30 NOTE — Telephone Encounter (Signed)
Yeas, she was supposed to return for additional labs per the last phone message but is reportedly out of town until October 12..  But f she wants to go right to Dr [name] and  let him continue the workup, that's fine.  The labs are ordered and he can add to them as he sees fit.

## 2014-08-30 NOTE — Telephone Encounter (Signed)
Patient still very concerned about labs and would like to know should she set up an appointment to talk with Dr. [name] or does she need any additional labs?

## 2014-08-30 NOTE — Telephone Encounter (Signed)
Left message for patient to return call to office. 

## 2014-09-01 ENCOUNTER — Other Ambulatory Visit: Payer: Self-pay | Admitting: *Deleted

## 2014-09-01 ENCOUNTER — Other Ambulatory Visit (HOSPITAL_BASED_OUTPATIENT_CLINIC_OR_DEPARTMENT_OTHER): Payer: Commercial Managed Care - HMO

## 2014-09-01 DIAGNOSIS — D472 Monoclonal gammopathy: Secondary | ICD-10-CM

## 2014-09-01 DIAGNOSIS — C50512 Malignant neoplasm of lower-outer quadrant of left female breast: Secondary | ICD-10-CM

## 2014-09-01 DIAGNOSIS — C50812 Malignant neoplasm of overlapping sites of left female breast: Secondary | ICD-10-CM

## 2014-09-01 LAB — CBC WITH DIFFERENTIAL/PLATELET
BASO%: 0.4 % (ref 0.0–2.0)
Basophils Absolute: 0.1 10*3/uL (ref 0.0–0.1)
EOS%: 1.3 % (ref 0.0–7.0)
Eosinophils Absolute: 0.2 10*3/uL (ref 0.0–0.5)
HEMATOCRIT: 40.6 % (ref 34.8–46.6)
HGB: 13.3 g/dL (ref 11.6–15.9)
LYMPH%: 50.6 % — AB (ref 14.0–49.7)
MCH: 29 pg (ref 25.1–34.0)
MCHC: 32.8 g/dL (ref 31.5–36.0)
MCV: 88.6 fL (ref 79.5–101.0)
MONO#: 0.6 10*3/uL (ref 0.1–0.9)
MONO%: 4.7 % (ref 0.0–14.0)
NEUT#: 5.3 10*3/uL (ref 1.5–6.5)
NEUT%: 43 % (ref 38.4–76.8)
PLATELETS: 269 10*3/uL (ref 145–400)
RBC: 4.58 10*6/uL (ref 3.70–5.45)
RDW: 13.1 % (ref 11.2–14.5)
WBC: 12.2 10*3/uL — ABNORMAL HIGH (ref 3.9–10.3)
lymph#: 6.2 10*3/uL — ABNORMAL HIGH (ref 0.9–3.3)

## 2014-09-01 LAB — TECHNOLOGIST REVIEW

## 2014-09-01 LAB — COMPREHENSIVE METABOLIC PANEL (CC13)
ALT: 25 U/L (ref 0–55)
ANION GAP: 8 meq/L (ref 3–11)
AST: 23 U/L (ref 5–34)
Albumin: 3.8 g/dL (ref 3.5–5.0)
Alkaline Phosphatase: 63 U/L (ref 40–150)
BUN: 29.4 mg/dL — AB (ref 7.0–26.0)
CO2: 27 meq/L (ref 22–29)
CREATININE: 1.1 mg/dL (ref 0.6–1.1)
Calcium: 10.4 mg/dL (ref 8.4–10.4)
Chloride: 107 mEq/L (ref 98–109)
Glucose: 114 mg/dl (ref 70–140)
Potassium: 4.1 mEq/L (ref 3.5–5.1)
Sodium: 142 mEq/L (ref 136–145)
Total Bilirubin: 0.36 mg/dL (ref 0.20–1.20)
Total Protein: 7.4 g/dL (ref 6.4–8.3)

## 2014-09-04 ENCOUNTER — Other Ambulatory Visit: Payer: Self-pay | Admitting: *Deleted

## 2014-09-04 DIAGNOSIS — D472 Monoclonal gammopathy: Secondary | ICD-10-CM

## 2014-09-05 ENCOUNTER — Other Ambulatory Visit: Payer: Commercial Managed Care - HMO

## 2014-09-05 ENCOUNTER — Telehealth: Payer: Self-pay | Admitting: Oncology

## 2014-09-05 LAB — PROTEIN ELECTROPHORESIS, SERUM
ALBUMIN ELP: 58.2 % (ref 55.8–66.1)
ALPHA-1-GLOBULIN: 3.4 % (ref 2.9–4.9)
Alpha-2-Globulin: 13.1 % — ABNORMAL HIGH (ref 7.1–11.8)
Beta 2: 5.6 % (ref 3.2–6.5)
Beta Globulin: 6.8 % (ref 4.7–7.2)
Gamma Globulin: 12.9 % (ref 11.1–18.8)
M-Spike, %: 0.45 g/dL
Total Protein, Serum Electrophoresis: 7.1 g/dL (ref 6.0–8.3)

## 2014-09-05 LAB — KAPPA/LAMBDA LIGHT CHAINS
KAPPA FREE LGHT CHN: 1.21 mg/dL (ref 0.33–1.94)
KAPPA LAMBDA RATIO: 0.7 (ref 0.26–1.65)
Lambda Free Lght Chn: 1.74 mg/dL (ref 0.57–2.63)

## 2014-09-05 NOTE — Telephone Encounter (Signed)
s/w pt re appt for 11/10. central to schedule bone survey appt.

## 2014-09-05 NOTE — Telephone Encounter (Signed)
s.w. pt and advised on all appt....pt was already aware

## 2014-09-05 NOTE — Telephone Encounter (Signed)
Patient has consulted with Dr. [name] per chart. Patient has not returned call.

## 2014-09-06 ENCOUNTER — Ambulatory Visit (HOSPITAL_COMMUNITY)
Admission: RE | Admit: 2014-09-06 | Discharge: 2014-09-06 | Disposition: A | Discharge status: Home / Self Care | Payer: Commercial Managed Care - HMO | Source: Ambulatory Visit | Attending: Oncology | Admitting: Oncology

## 2014-09-06 DIAGNOSIS — D472 Monoclonal gammopathy: Secondary | ICD-10-CM | POA: Insufficient documentation

## 2014-09-07 ENCOUNTER — Encounter: Payer: Self-pay | Admitting: Internal Medicine

## 2014-09-12 ENCOUNTER — Ambulatory Visit (HOSPITAL_BASED_OUTPATIENT_CLINIC_OR_DEPARTMENT_OTHER): Payer: Commercial Managed Care - HMO | Admitting: Oncology

## 2014-09-12 VITALS — BP 162/63 | HR 90 | Temp 97.9°F | Resp 18 | Ht 66.25 in | Wt 173.9 lb

## 2014-09-12 DIAGNOSIS — Z170 Estrogen receptor positive status [ER+]: Secondary | ICD-10-CM

## 2014-09-12 DIAGNOSIS — D631 Anemia in chronic kidney disease: Secondary | ICD-10-CM

## 2014-09-12 DIAGNOSIS — D472 Monoclonal gammopathy: Secondary | ICD-10-CM

## 2014-09-12 DIAGNOSIS — N189 Chronic kidney disease, unspecified: Secondary | ICD-10-CM

## 2014-09-12 DIAGNOSIS — C50812 Malignant neoplasm of overlapping sites of left female breast: Secondary | ICD-10-CM

## 2014-09-12 DIAGNOSIS — C50512 Malignant neoplasm of lower-outer quadrant of left female breast: Secondary | ICD-10-CM

## 2014-09-12 NOTE — Progress Notes (Signed)
ID: Haley Love   DOB: February 24, 1934  MR#: [id number]  QMV#:[id number]   PCP:  Dr. Deborra Medina, [place] in [place]: left breast cancer; M-GUS  CURRENT THERAPY: observation  BREAST CANCER HISTORY: From the original intake note:  Haley Love had screening mammography at the [facility] 06/17/2012 which showed a possible mass in the left breast. Left diagnostic mammography performed 07/02/2012 showed a persistent 1 cm spiculated mass in the lateral left breast, which was palpable and firm. Ultrasound showed an irregular hypoechoic mass measuring 8 mm. The left axilla was unremarkable.  Biopsy of the mass the same day ([id number]) showed an invasive ductal carcinoma, grade 1, estrogen receptor 100% progesterone receptor 39% positive, with no HER-2 amplification and an MIB-1 of 7%. Bilateral breast MRI were obtained 07/08/2012 this showed an irregular enhancing mass in the left breast, middle third, measuring 9 mm. There were no other suspicious masses in either breast and no axillary or internal mammary adenopathy.  The patient's subsequent history is as detailed below.  INTERVAL HISTORY: Haley Love today for follow up of her breast cancer and new concern regarding a monoclonal gammopathy, accompanied by her granddaughter and her daughter-in-law. Haley Love had blood workup at Dr.Tullo's office which showed a monoclonal spike in her serum protein electrophoresis. More specifically, on 08/10/2004 the labs showed an M spike of 0.45, but with normal IgG IgA and IgM levels (respectively at 981, 299, and 69). The patient was referred for further evaluation and we set her up for a bone survey prior to this visit.  REVIEW OF SYSTEMS: Haley Love is understandably anxious regarding the hematology question but otherwise she is doing "fine". She is now about 2 months out from her right knee surgery she still has a little bit of warmth a little bit of swelling in the right knee and she Then did back more  than about 100, but she is walking much better and she is "planning to walk more". She does have some osteoarthritis pain in other areas as well but this is not more intense or persistent than before. There has not been any fever, rash, bleeding, or unexplained fatigue or unexplained weight loss. A detailed review of systems today was otherwise stable   PAST MEDICAL HISTORY: Past Medical History  Diagnosis Date  . Breast cancer   . Hypertension   . Contact lens/glasses fitting   . Hyperlipidemia   . Inflammatory polyps of colon   . Cataract     PAST SURGICAL HISTORY: Past Surgical History  Procedure Laterality Date  . No past surgeries    . Breast surgery  9/13    lt lump  . Mastectomy, partial  08/06/2012    Procedure: MASTECTOMY PARTIAL;  Surgeon: Adin Hector, MD;  Location: [facility];  Service: General;  Laterality: Left;  Left partial mastectomy with re-excision of margins  . Cataracts  2014  . Knee arthroscopy w/ meniscal repair  09/23/11    Right  . Joint replacement Right July 2015    [place], right knee  Right knee replacement in late July 2015.  FAMILY HISTORY Family History  Problem Relation Age of Onset  . Lung cancer Mother   . Arthritis Mother   . Hyperlipidemia Mother   . Hypertension Mother   . Throat cancer Father   . Hyperlipidemia Father   . Cancer Paternal Aunt     breast   the patient's father died at the age of 49 from cancer of the throat  which had been diagnosed 6 years earlier. The patient's mother died at the age of 89. She had cancer of the lung lining diagnosed at age 85. The patient had 4 brothers, no sisters. There is no other cancer history in the immediate family.  GYNECOLOGIC HISTORY: Menarche age 14. Change of life age 54. She took hormone replacement approximately 10 years. She is GX P2, with first live birth at age 22.  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 She is  divorced. Her son Haley Love (goes by "J.") also lives in [place] and is self-employed running a chemical cleaning business. Daughter Haley Love lives in [place]. She works as a designer. The patient has 3 grandchildren. She attends a local [employer].  ADVANCED DIRECTIVES: In place  HEALTH MAINTENANCE: History  Substance Use Topics  . Smoking status: Never Smoker   . Smokeless tobacco: Never Used  . Alcohol Use: Yes     Comment: 1 glass wine week     Colonoscopy: 2012  PAP: 2011  Bone density: 2011  Lipid panel:  Allergies  Allergen Reactions  . No Known Allergies    OBJECTIVE: Older white woman who appears younger than stated age ECOG: 1  Filed Vitals:   09/12/14 1617  BP: 162/63  Pulse: 90  Temp: 97.9 F (36.6 C)  Resp: 18  Body mass index is 27.85 kg/(m^2).  Sclerae unicteric, EOMs intact Oropharynx clear and moist No cervical or supraclavicular adenopathy Lungs no rales or rhonchi Heart regular rate and rhythm Abd soft, nontender, positive bowel sounds MSK no focal spinal tenderness, minimal edema right knee, with well-healed surgical scar Neuro: nonfocal, well oriented, appropriate affect Breasts: The right breast is unremarkable. The left breast is status post lumpectomy. There is no evidence of local recurrence. The left axilla is benign  LAB RESULTS: Lab Results  Component Value Date   WBC 12.2* 09/01/2014   NEUTROABS 5.3 09/01/2014   HGB 13.3 09/01/2014   HCT 40.6 09/01/2014   MCV 88.6 09/01/2014   PLT 269 09/01/2014      Chemistry      Component Value Date/Time   NA 142 09/01/2014 1048   NA 137 08/10/2014 1130   K 4.1 09/01/2014 1048   K 4.7 08/10/2014 1130   CL 103 08/10/2014 1130   CL 105 09/08/2012 1002   CO2 27 09/01/2014 1048   CO2 27 08/10/2014 1130   BUN 29.4* 09/01/2014 1048   BUN 30* 08/10/2014 1130   CREATININE 1.1 09/01/2014 1048   CREATININE 1.4* 08/10/2014 1130      Component Value Date/Time   CALCIUM 10.4 09/01/2014 1048    CALCIUM 10.2 08/10/2014 1130   CALCIUM 9.9 08/10/2014 1130   ALKPHOS 63 09/01/2014 1048   ALKPHOS 50 09/22/2013 0938   AST 23 09/01/2014 1048   AST 32 09/22/2013 0938   ALT 25 09/01/2014 1048   ALT 37* 09/22/2013 0938   BILITOT 0.36 09/01/2014 1048   BILITOT 0.8 09/22/2013 0938       Lab Results  Component Value Date   LABCA2 35 07/14/2012     STUDIES: Most recent mammogram on 07/20/2014 was unremarkable  ASSESSMENT: 79 y.o. [place],  woman s/p Left lumpectomy 07/28/2012 for a pT1c NX, stage IA invasive ductal carcinoma, grade 1, estrogen receptor 100% and progesterone receptor 39% positive, with no HER-2 amplification and an MIB-1 of 7.   (1) initially positive margins were cleared 08/06/2012  (2) tamoxifen started   October 2013, stopped March 2014 because of cramps and hip pain  (3) anastrozole started May 2014-- patient tells me she stopped after a few days "with the same symptoms as tamoxifen"  (4) patient chose to forego anti-estrogen therapy altogether, she will follow up in office until she is 5 years out from her definitive surgery.  (5) M-GUS documented 08/10/2014, with M spike of 0.45, normal total IgG, IgA and IgM, normal kappa/lambda ratio, no anemia, normal creatinine and calcium  (a) bone survey 09/06/2014 shows no pathologic lytic lesions   PLAN:   I spent approximately one hour today discussing the abnormal lab values with Naamah and her family. They understand that plasma cells make antibodies (immunoglobulins) and that each plasma cell normally makes a different antibody. If we spread all the antibodies in a gel by charge there will be pretty much of flatline except for a single peak which will be albumin. The reason there is an albumin peek is that every single albumin molecule is identical, therefore has the same charge, and therefore accumulates in the same spot on the gel.  In her case there is a second peak. Recall that second peak a "monoclonal spike" or "M  spike". The peek is a signal that there is a clone of plasma cells each of which makes an identical immunoglobulin. This is not a normal situation. However it is not necessarily malignant situation. The small plasma cell clone may simply remain without becoming more active, or may even disappear with time.  There is however a type of cancer associated with M spike's. That is multiple myeloma. The way to tell multiple myeloma from a benign monoclonal spike is by looking at the [facility] spike. In myeloma the spike will be greater than 3 g. Second, and multiple myeloma there will be multiple holes in the bones. Finally in multiple myeloma there will be a high percentage of plasma cells in the bone marrow, as opposed to approximately 2% normal.  We will look at Cydni situation, we see that her M spike is less than a [name]. The "holes" we see in her bone survey are actually screw holes from her recent knee surgery. I don't see any need to do a bone marrow biopsy in this setting, since the other 2 criteria Arleta is away from a diagnosis of multiple myeloma.  We do need to follow this however. For the next year I will be obtaining lab work on [place] every 3 months. She will see me every 6 months. If after a year there has been no change, we will brought in the followup interval to every 4 months labs and yearly visit.   She has a very good understanding of the overall plan. She agrees with it. She knows to call for any problems that may develop before next visit here. Tiara Bartoli C    09/12/2014

## 2014-09-13 ENCOUNTER — Other Ambulatory Visit: Payer: Self-pay | Admitting: *Deleted

## 2014-09-13 ENCOUNTER — Telehealth: Payer: Self-pay | Admitting: Oncology

## 2014-09-13 NOTE — Addendum Note (Signed)
Addended by: Laureen Abrahams on: 09/13/2014 06:02 PM   Modules accepted: Orders, Medications

## 2014-09-13 NOTE — Telephone Encounter (Signed)
Confirm appt d/t for [place] in Jan 2016 & appt d/t for March 2016. Also mailed cal for appt d/t.

## 2014-09-15 ENCOUNTER — Other Ambulatory Visit: Payer: Commercial Managed Care - HMO

## 2014-10-03 ENCOUNTER — Ambulatory Visit: Payer: Commercial Managed Care - HMO | Admitting: Oncology

## 2014-10-03 NOTE — Progress Notes (Signed)
No show

## 2014-10-12 ENCOUNTER — Other Ambulatory Visit: Payer: Self-pay | Admitting: Internal Medicine

## 2014-11-27 ENCOUNTER — Telehealth: Payer: Self-pay

## 2014-11-27 NOTE — Telephone Encounter (Signed)
Please call this pt regarding her ultrasound  Callback - [phone]

## 2014-11-28 ENCOUNTER — Other Ambulatory Visit: Payer: Commercial Managed Care - HMO

## 2015-01-17 DIAGNOSIS — Z85828 Personal history of other malignant neoplasm of skin: Secondary | ICD-10-CM | POA: Diagnosis not present

## 2015-01-17 DIAGNOSIS — D485 Neoplasm of uncertain behavior of skin: Secondary | ICD-10-CM | POA: Diagnosis not present

## 2015-01-17 DIAGNOSIS — D2262 Melanocytic nevi of left upper limb, including shoulder: Secondary | ICD-10-CM | POA: Diagnosis not present

## 2015-01-17 DIAGNOSIS — L538 Other specified erythematous conditions: Secondary | ICD-10-CM | POA: Diagnosis not present

## 2015-01-17 DIAGNOSIS — D2272 Melanocytic nevi of left lower limb, including hip: Secondary | ICD-10-CM | POA: Diagnosis not present

## 2015-01-17 DIAGNOSIS — L820 Inflamed seborrheic keratosis: Secondary | ICD-10-CM | POA: Diagnosis not present

## 2015-01-17 DIAGNOSIS — D224 Melanocytic nevi of scalp and neck: Secondary | ICD-10-CM | POA: Diagnosis not present

## 2015-02-19 ENCOUNTER — Other Ambulatory Visit (HOSPITAL_BASED_OUTPATIENT_CLINIC_OR_DEPARTMENT_OTHER): Payer: Commercial Managed Care - HMO

## 2015-02-19 ENCOUNTER — Ambulatory Visit (HOSPITAL_BASED_OUTPATIENT_CLINIC_OR_DEPARTMENT_OTHER): Payer: Commercial Managed Care - HMO | Admitting: Oncology

## 2015-02-19 ENCOUNTER — Other Ambulatory Visit: Payer: Commercial Managed Care - HMO

## 2015-02-19 ENCOUNTER — Telehealth: Payer: Self-pay | Admitting: Oncology

## 2015-02-19 VITALS — BP 146/67 | HR 78 | Temp 97.8°F | Resp 18 | Ht 66.25 in | Wt 174.0 lb

## 2015-02-19 DIAGNOSIS — D472 Monoclonal gammopathy: Secondary | ICD-10-CM

## 2015-02-19 DIAGNOSIS — C50512 Malignant neoplasm of lower-outer quadrant of left female breast: Secondary | ICD-10-CM

## 2015-02-19 DIAGNOSIS — K635 Polyp of colon: Secondary | ICD-10-CM

## 2015-02-19 DIAGNOSIS — N189 Chronic kidney disease, unspecified: Secondary | ICD-10-CM

## 2015-02-19 DIAGNOSIS — C50519 Malignant neoplasm of lower-outer quadrant of unspecified female breast: Secondary | ICD-10-CM | POA: Diagnosis not present

## 2015-02-19 DIAGNOSIS — D72829 Elevated white blood cell count, unspecified: Secondary | ICD-10-CM

## 2015-02-19 DIAGNOSIS — D631 Anemia in chronic kidney disease: Secondary | ICD-10-CM | POA: Diagnosis not present

## 2015-02-19 DIAGNOSIS — N183 Chronic kidney disease, stage 3 unspecified: Secondary | ICD-10-CM

## 2015-02-19 LAB — COMPREHENSIVE METABOLIC PANEL (CC13)
ALBUMIN: 3.8 g/dL (ref 3.5–5.0)
ALT: 33 U/L (ref 0–55)
AST: 30 U/L (ref 5–34)
Alkaline Phosphatase: 66 U/L (ref 40–150)
Anion Gap: 10 mEq/L (ref 3–11)
BUN: 18.2 mg/dL (ref 7.0–26.0)
CALCIUM: 9.8 mg/dL (ref 8.4–10.4)
CO2: 25 mEq/L (ref 22–29)
Chloride: 108 mEq/L (ref 98–109)
Creatinine: 1 mg/dL (ref 0.6–1.1)
EGFR: 56 mL/min/{1.73_m2} — ABNORMAL LOW (ref >90)
GLUCOSE: 113 mg/dL (ref 70–140)
POTASSIUM: 4.1 meq/L (ref 3.5–5.1)
SODIUM: 143 meq/L (ref 136–145)
Total Bilirubin: 0.4 mg/dL (ref 0.20–1.20)
Total Protein: 7.1 g/dL (ref 6.4–8.3)

## 2015-02-19 LAB — CBC WITH DIFFERENTIAL/PLATELET
BASO%: 0.4 % (ref 0.0–2.0)
Basophils Absolute: 0 10*3/uL (ref 0.0–0.1)
EOS%: 6.3 % (ref 0.0–7.0)
Eosinophils Absolute: 0.7 10*3/uL — ABNORMAL HIGH (ref 0.0–0.5)
HEMATOCRIT: 42 % (ref 34.8–46.6)
HGB: 14.1 g/dL (ref 11.6–15.9)
LYMPH%: 49.5 % (ref 14.0–49.7)
MCH: 30 pg (ref 25.1–34.0)
MCHC: 33.6 g/dL (ref 31.5–36.0)
MCV: 89.4 fL (ref 79.5–101.0)
MONO#: 0.8 10*3/uL (ref 0.1–0.9)
MONO%: 6.8 % (ref 0.0–14.0)
NEUT#: 4.1 10*3/uL (ref 1.5–6.5)
NEUT%: 37 % — AB (ref 38.4–76.8)
Platelets: 231 10*3/uL (ref 145–400)
RBC: 4.7 10*6/uL (ref 3.70–5.45)
RDW: 12.9 % (ref 11.2–14.5)
WBC: 11.2 10*3/uL — ABNORMAL HIGH (ref 3.9–10.3)
lymph#: 5.5 10*3/uL — ABNORMAL HIGH (ref 0.9–3.3)

## 2015-02-19 LAB — TECHNOLOGIST REVIEW

## 2015-02-19 NOTE — Telephone Encounter (Signed)
appoinements made and avs printed for patient,patient already recd a u/a cup for the lab

## 2015-02-19 NOTE — Progress Notes (Signed)
ID: Haley Love   DOB: 1934/07/25  MR#: [id number]  TOI#:[id number]   PCP:  Dr. Deborra Medina, [place] in [place]: left breast cancer; M-GUS  CURRENT THERAPY: observation  BREAST CANCER HISTORY: From the original intake note:  Haley Love had screening mammography at the [facility] 06/17/2012 which showed a possible mass in the left breast. Left diagnostic mammography performed 07/02/2012 showed a persistent 1 cm spiculated mass in the lateral left breast, which was palpable and firm. Ultrasound showed an irregular hypoechoic mass measuring 8 mm. The left axilla was unremarkable.  Biopsy of the mass the same day ([id number]) showed an invasive ductal carcinoma, grade 1, estrogen receptor 100% progesterone receptor 39% positive, with no HER-2 amplification and an MIB-1 of 7%. Bilateral breast MRI were obtained 07/08/2012 this showed an irregular enhancing mass in the left breast, middle third, measuring 9 mm. There were no other suspicious masses in either breast and no axillary or internal mammary adenopathy.  The patient's subsequent history is as detailed below.  INTERVAL HISTORY: Haley Love returns today for follow up of her breast cancer and monoclonal gammopathy.  The interval history is generally unremarkable. She did a lot of cooking for Easter and had her whole family over.  REVIEW OF SYSTEMS: Haley Love  Has some lower back pain which she notices most in the morning, and which ges  Better with activity. She used to be walking 5 times a day with her brother but because of whether another issues she is not doing that any more. There have been no unusual headaches, visual changes, nausea, vomiting, or gait imbalance. She denies cough, phlegm production, or pleurisy. She is not aware of any palpitations chest pain or pressure problems. She maintains her self regular with herbal laxatives. A detailed review of systems today was otherwise stable  PAST MEDICAL HISTORY: Past Medical  History  Diagnosis Date  . Breast cancer   . Hypertension   . Contact lens/glasses fitting   . Hyperlipidemia   . Inflammatory polyps of colon   . Cataract     PAST SURGICAL HISTORY: Past Surgical History  Procedure Laterality Date  . No past surgeries    . Breast surgery  9/13    lt lump  . Mastectomy, partial  08/06/2012    Procedure: MASTECTOMY PARTIAL;  Surgeon: Adin Hector, MD;  Location: [facility];  Service: General;  Laterality: Left;  Left partial mastectomy with re-excision of margins  . Cataracts  2014  . Knee arthroscopy w/ meniscal repair  09/23/11    Right  . Joint replacement Right July 2015    [place], right knee  Right knee replacement in late July 2015.  FAMILY HISTORY Family History  Problem Relation Age of Onset  . Lung cancer Mother   . Arthritis Mother   . Hyperlipidemia Mother   . Hypertension Mother   . Throat cancer Father   . Hyperlipidemia Father   . Cancer Paternal Aunt     breast   the patient's father died at the age of 37 from cancer of the throat which had been diagnosed 6 years earlier. The patient's mother died at the age of 39. She had cancer of the lung lining diagnosed at age 63. The patient had 4 brothers, no sisters. There is no other cancer history in the immediate family.  GYNECOLOGIC HISTORY: Menarche age 39. Change of life age 43. She took hormone replacement approximately 10 years. She is GX P2, with first live  birth at age 22.  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 She is divorced. Her son James Molock (goes by "J.") also lives in [place] and is self-employed running a chemical cleaning business. Daughter Nancy Franson lives in [place]. She works as a designer. The patient has 3 grandchildren. She attends a local [employer].  ADVANCED DIRECTIVES: In place  HEALTH MAINTENANCE: History  Substance Use Topics  . Smoking status: Never Smoker   .  Smokeless tobacco: Never Used  . Alcohol Use: Yes     Comment: 1 glass wine week     Colonoscopy: 2012  PAP: 2011  Bone density: 2011  Lipid panel:  Allergies  Allergen Reactions  . No Known Allergies    OBJECTIVE: Older white woman who appears  well ECOG: 1  Filed Vitals:   02/19/15 1058  BP: 146/67  Pulse: 78  Temp: 97.8 F (36.6 C)  Resp: 18  Body mass index is 27.86 kg/(m^2).  Sclerae unicteric,  EOMs intact Oropharynx clear and moist No cervical or supraclavicular adenopathy Lungs no rales or rhonchi Heart regular rate and rhythm Abd soft, nontender, positive bowel sounds MSK no focal spinal tenderness and particularly no lumbar discomfort to vigorous palpation, no upper extremity lymphedema Neuro: nonfocal, well oriented, anxious affect Breasts:  The right breast is unremarkable. The left breast is status post lumpectomy. There is no evidence of local recurrence. The left axilla is benign.    LAB RESULTS: Lab Results  Component Value Date   WBC 11.2* 02/19/2015   NEUTROABS 4.1 02/19/2015   HGB 14.1 02/19/2015   HCT 42.0 02/19/2015   MCV 89.4 02/19/2015   PLT 231 02/19/2015      Chemistry      Component Value Date/Time   NA 142 09/01/2014 1048   NA 137 08/10/2014 1130   K 4.1 09/01/2014 1048   K 4.7 08/10/2014 1130   CL 103 08/10/2014 1130   CL 105 09/08/2012 1002   CO2 27 09/01/2014 1048   CO2 27 08/10/2014 1130   BUN 29.4* 09/01/2014 1048   BUN 30* 08/10/2014 1130   CREATININE 1.1 09/01/2014 1048   CREATININE 1.4* 08/10/2014 1130      Component Value Date/Time   CALCIUM 10.4 09/01/2014 1048   CALCIUM 10.2 08/10/2014 1130   CALCIUM 9.9 08/10/2014 1130   ALKPHOS 63 09/01/2014 1048   ALKPHOS 50 09/22/2013 0938   AST 23 09/01/2014 1048   AST 32 09/22/2013 0938   ALT 25 09/01/2014 1048   ALT 37* 09/22/2013 0938   BILITOT 0.36 09/01/2014 1048   BILITOT 0.8 09/22/2013 0938       Lab Results  Component Value Date   LABCA2 35 07/14/2012      STUDIES:  ADDENDUM REPORT: 09/07/2014 13:35  ADDENDUM: Upon further review, the small lucencies described in the proximal right tibia could potentially represent surgical screw holes as opposed to lytic lesions. Lateral projection of the right tibia and fibula would be helpful.   Electronically Signed  By: James Green M.D.  On: 09/07/2014 13:35      Study Result     CLINICAL DATA: IgG monoclonal gammopathy.  EXAM: METASTATIC BONE SURVEY  COMPARISON: None.  FINDINGS: There is a possible lucency seen in the proximal left radius. Status post right total knee arthroplasty. Three small lucencies are noted in the proximal right tibial shaft. Degenerative changes noted in the cervical, thoracic and lumbar spine. No other lucencies or   potential lytic areas are noted in the visualized skeleton.  IMPRESSION: Possible lucency seen in the proximal left radius, with 3 smaller lucencies seen in the proximal right tibial shaft. These potentially may represent areas of lytic destruction which may be seen in multiple myeloma or metastatic disease.  Electronically Signed: By: James Green M.D. On: 09/06/2014 15:32      ASSESSMENT: 80 y.o. [place], [place] woman s/p Left lumpectomy 07/28/2012 for a pT1c NX, stage IA invasive ductal carcinoma, grade 1, estrogen receptor 100% and progesterone receptor 39% positive, with no HER-2 amplification and an MIB-1 of 7.   (1) initially positive margins were cleared 08/06/2012  (2) tamoxifen started October 2013, stopped March 2014 because of cramps and hip pain  (3) anastrozole started May 2014-- patient tells me she stopped after a few days "with the same symptoms as tamoxifen"  (4) patient chose to forego anti-estrogen therapy altogether, she will follow up in office until she is 5 years out from her definitive surgery.  (5) M-GUS documented 08/10/2014, with M spike of 0.45, normal total IgG, IgA and IgM, normal  kappa/lambda ratio, no anemia, normal creatinine and calcium  (a) bone survey 09/06/2014 shows no pathologic lytic lesions   PLAN:   Katelee is doing fine both with regards to her history of breast cancer and her history of M-GUS. She will have her next mammogram in August and she will see me again in September. We will also repeat her myeloma labs before the September visit. (I have suggested that if she can come at least a couple of days before the actual visit to get the labs drawn she will not have to wait after the visit to get a phone call to tell her the results).  After the September visit we will start following her on a once a year basis, after her yearly mammography. --  Though she is now nearly 3 years out from her original surgery, because she did not receive radiation (the expectation was she would taken antiestrogen is for 5 years, which she was not able to tolerate) her risk of local recurrence is higher than average. Accordingly we will follow her for a total of 10 years  I have encouraged herto go back to her daily walking with her brother, which was working well before , at which will help her bones in addition to her mood, weight, and metabolism.  She has a very good understanding of the overall plan. She agrees with it. She knows to call for any problems that may develop before next visit here. , C    02/19/2015   

## 2015-02-20 LAB — PROTEIN / CREATININE RATIO, URINE
Creatinine, Urine: 89.8 mg/dL
PROTEIN CREATININE RATIO: 0.06 (ref <0.15)
TOTAL PROTEIN, URINE: 5 mg/dL (ref 5–24)

## 2015-02-21 LAB — PROTEIN ELECTROPHORESIS, SERUM
ALBUMIN ELP: 4.2 g/dL (ref 3.8–4.8)
ALPHA-1-GLOBULIN: 0.2 g/dL (ref 0.2–0.3)
ALPHA-2-GLOBULIN: 0.9 g/dL (ref 0.5–0.9)
Abnormal Protein Band1: 0.4 g/dL
BETA 2: 0.4 g/dL (ref 0.2–0.5)
BETA GLOBULIN: 0.4 g/dL (ref 0.4–0.6)
GAMMA GLOBULIN: 0.9 g/dL (ref 0.8–1.7)
Total Protein, Serum Electrophoresis: 7 g/dL (ref 6.1–8.1)

## 2015-02-21 LAB — KAPPA/LAMBDA LIGHT CHAINS
KAPPA LAMBDA RATIO: 1.37 (ref 0.26–1.65)
Kappa free light chain: 1.78 mg/dL (ref 0.33–1.94)
LAMBDA FREE LGHT CHN: 1.3 mg/dL (ref 0.57–2.63)

## 2015-03-01 ENCOUNTER — Telehealth: Payer: Self-pay | Admitting: *Deleted

## 2015-03-01 NOTE — Telephone Encounter (Signed)
Please copy labs and fax or mail to patient; also please explain to pt how to access "my chart."  Thanks!

## 2015-03-01 NOTE — Telephone Encounter (Signed)
Pt called and requested all lab results from last office visit on 02/19/15.  Message sent to Dr. [name] and Tivis Ringer, desk nurse today. Pt's  Phone   [phone]

## 2015-03-06 ENCOUNTER — Telehealth: Payer: Self-pay | Admitting: *Deleted

## 2015-03-06 IMAGING — CR DG KNEE 1-2V*R*
1 series · 2 of 2 positions shown · non-contrast
Comparison: 07/04/2013

CLINICAL DATA: Postop

EXAM:
RIGHT KNEE - 1-2 VIEW

[Series 1: ap · 0.17mm/px · 2 of 2 slices shown]
[im 1/2]
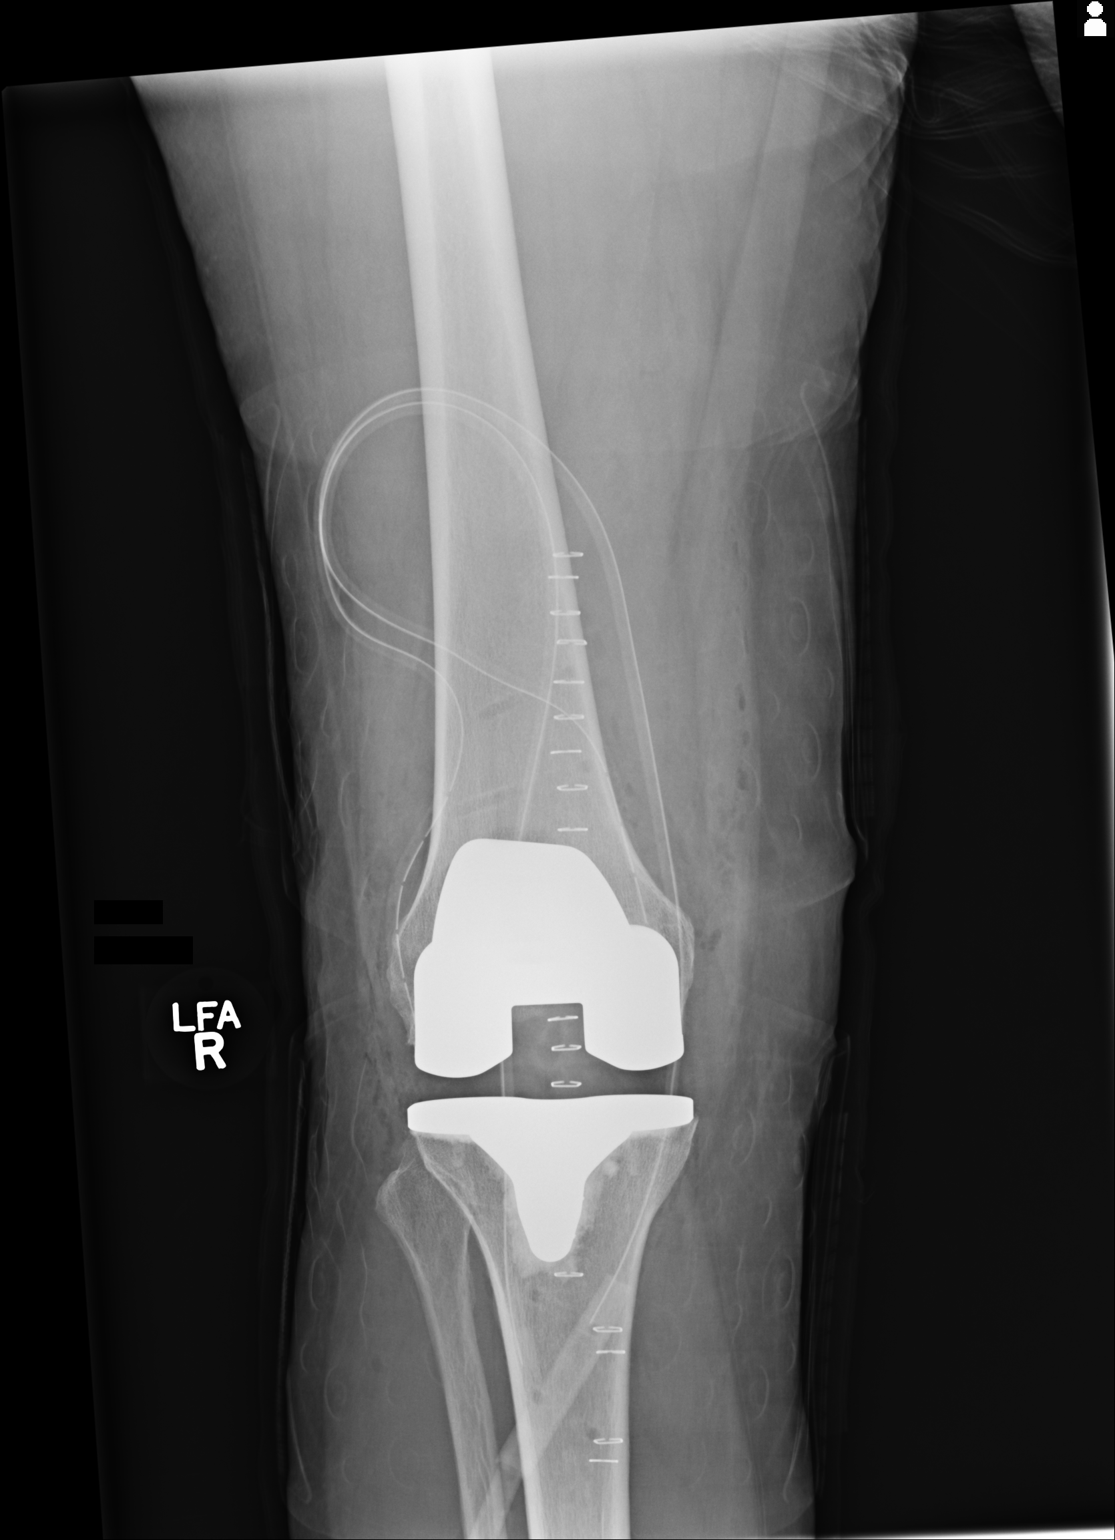
[im 2/2]
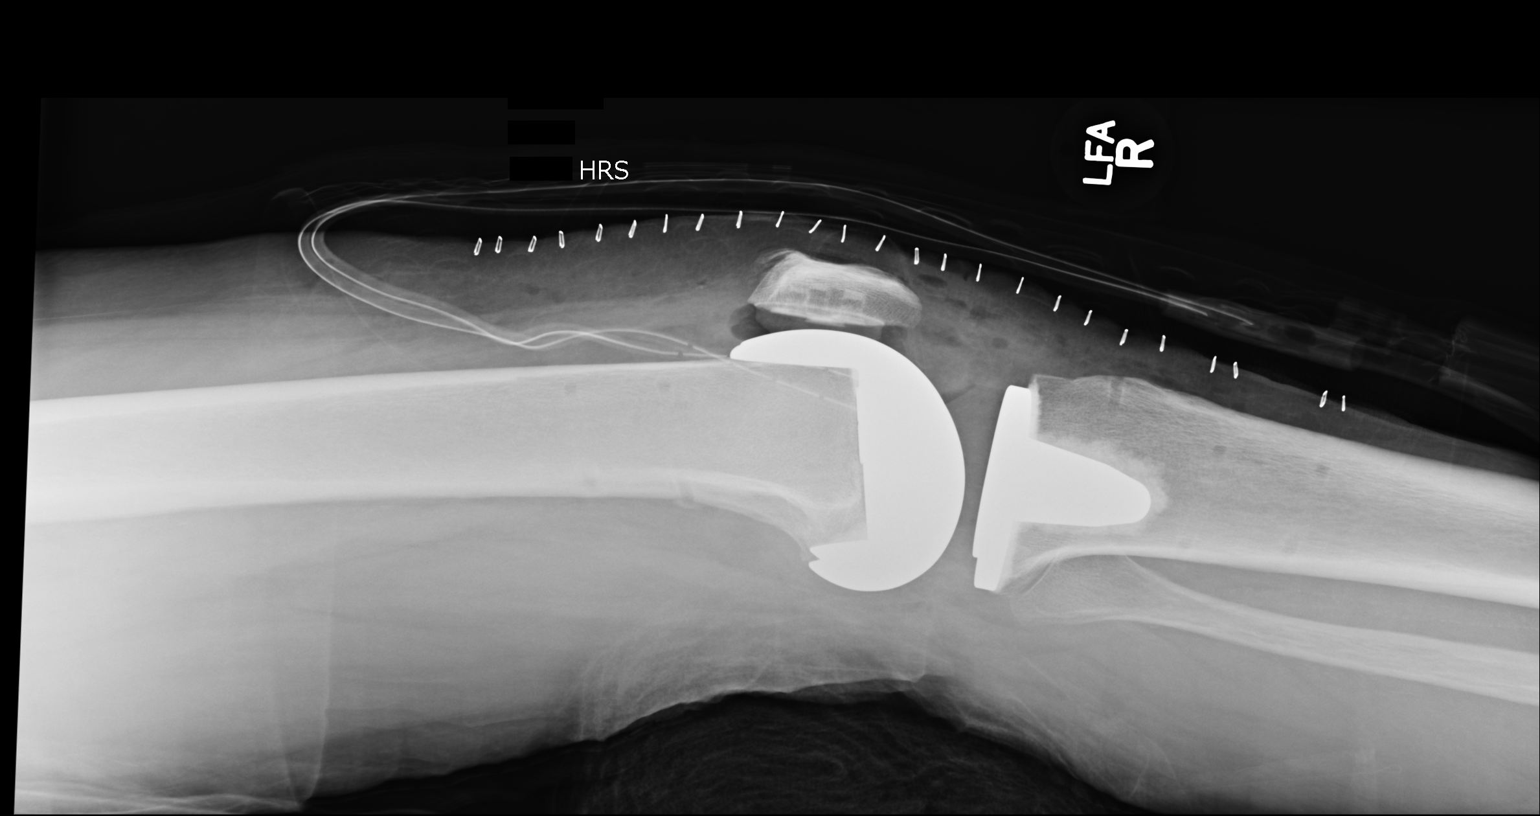

[2 of 2 positions shown; findings below may reference images not displayed]

FINDINGS: Patient is status post tricompartmental knee replacement, with
components in anticipated position. There is also anticipated soft
tissue postsurgical change over the knee joint. There is a
percutaneous drainage catheter within the knee joints as well.
IMPRESSION: Anticipated postoperative appearance.

## 2015-03-06 NOTE — Telephone Encounter (Signed)
Pt called requesting lab results from 02/19/15.  Per Dr. [name]  - either fax or mail copy of lab results to pt.  Called pt back on both home and cell phone unsuccessfully.  Left message on cell phone voice mail informing pt that copy of lab results will be mailed to pt's home.  Informed pt that she could also find results on MyChart if pt is set up. Pt's  Phone   [phone].

## 2015-03-13 NOTE — Op Note (Signed)
PATIENT NAME:  KALENE, ABDO MR#:  [id number] DATE OF BIRTH:  Mar 28, 1934  DATE OF PROCEDURE:  09/22/2012  PREOPERATIVE DIAGNOSIS: Internal derangement of the right knee.   POSTOPERATIVE DIAGNOSES:  1. Tear of the posterior horn medial meniscus, right knee.  2. Tear of the anterior horn lateral meniscus, right knee.  3. 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   PROCEDURES PERFORMED:  1. Right knee arthroscopy.  2. Partial medial and lateral meniscectomies.  3. Chondroplasty.   SURGEON: Laurice Record. Holley Bouche., MD   ANESTHESIA: General.   ESTIMATED BLOOD LOSS: Minimal.   TOURNIQUET TIME: Not used.   FLUIDS REPLACED: 1100 mL of Crystalloid.   DRAINS: None.   INDICATIONS FOR SURGERY: The patient is a 81 year old female who has been seen for complaints of pain and swelling to the right knee. MRI demonstrated findings consistent with meniscal pathology. After discussion of the risks and benefits of surgical intervention, the patient expressed her understanding of the risks and benefits and agreed with plans for surgical intervention.   PROCEDURE IN DETAIL: The patient was brought in the operating room and, after adequate general anesthesia was achieved, a tourniquet was placed on the patient's right thigh and leg was placed in a leg holder. All bony prominences were well padded. The patient's right knee and leg were cleaned and prepped with alcohol and DuraPrep and draped in the usual sterile fashion. A time-out was performed as per usual protocol. The anticipated portal sites were injected with 0.25% Marcaine with epinephrine. An anterolateral portal was created and a cannula was inserted. A moderate effusion was evacuated. Scope was inserted and the knee was distended with fluid using the DePuy Mitek pump. Scope was advanced down the medial gutter of the knee into the medial compartment. Under visualization with the scope, an anteromedial portal was created and a  hook probe was inserted. Inspection of the medial compartment demonstrated a complex tear of the posterior horn of the medial meniscus. The tear was debrided using meniscal punches and a 4.5 mm shaver. Final contouring was performed using a 50 degree ArthroCare wand. The anterior horn of the medial meniscus was visualized and probed and felt to be stable. There were grade II to early grade III changes of chondromalacia involving primarily medial femoral condyle. These areas were debrided using the ArthroCare wand. Scope was then advanced into the intercondylar region. Anterior cruciate ligament was visualized and probed and felt to stable. Scope was removed from the anterolateral portal and reinserted via the anteromedial portal so as to better visualize the lateral compartment. Some grade II to early grade III changes were noted to the lateral tibial plateau and lateral femoral condyle. These areas were debrided using the ArthroCare wand. Complex tear of the anterior horn of the lateral meniscus was visualized and probed. The area of the tear was debrided using a combination of 4.5 mm shaver and then contoured using the 50 degree ArthroCare wand. Finally, the scope was positioned so as to visualize the patellofemoral articulation. Articular surface was in good condition. Good patellar tracking was noted.   The knee was irrigated with copious amounts of fluid and suctioned dry. The anterolateral portal was reapproximated using #3-0 nylon. A combination of 0.25% Marcaine with epinephrine and 4 mg of morphine was injected via the scope. Scope was removed and the anteromedial portal was reapproximated using #3-0 nylon. Sterile dressing was applied followed by application of an ice wrap.   The patient tolerated the  procedure well. She was transported to the recovery room in stable condition.   ____________________________ Laurice Record. Holley Bouche., MD jph:drc D: 09/22/2012 20:34:28 ET T: 09/23/2012 09:21:27  ET JOB#: [id number]  cc: Laurice Record. Holley Bouche., MD, <Dictator> Laurice Record Holley Bouche MD ELECTRONICALLY SIGNED 09/24/2012 7:43

## 2015-03-17 NOTE — Discharge Summary (Signed)
PATIENT NAME:  Haley Love, Haley Love MR#:  [id number] DATE OF BIRTH:  03-07-1934  DATE OF ADMISSION:  06/14/2014 DATE OF DISCHARGE:    ADMITTING DIAGNOSIS: Right knee degenerative arthritis.   DISCHARGE DIAGNOSIS: Right knee degenerative arthritis.   PROCEDURE PERFORMED: Right total knee arthroplasty using computer-assisted navigation.   SURGEON: Skip Estimable, M.D.   ASSISTANT: Reche Dixon, [place].   ANESTHESIA: Spinal.   ESTIMATED BLOOD LOSS: 100 mL.   FLUIDS REPLACED: 2200 mL of crystalloid.   TOURNIQUET TIME: 85 minutes.   DRAINS: Two medium drains to reinfusion system.   SOFT TISSUE RELEASES: Anterior cruciate ligament, posterior cruciate ligament, deep medial collateral ligament and patellofemoral ligament.   IMPLANTS UTILIZED: DePuy PFC Sigma size 3 posterior stabilized femoral component, size 3 MBT tibial component, 35 mm three-peg oval dome patella, and a 10 mm stabilized rotating platform polyethylene insert.   HISTORY OF PRESENT ILLNESS: The patient is 81 year old female who presents for right knee pain. She is to undergo a right total knee arthroplasty on 06/14/2014. She presents today for history and physical. She has a long history of progressive right knee pain. She underwent a right knee arthroscopy, partial medial and lateral meniscectomy, as well as chondroplasty in October 2013. She has noticed increased pain in the right knee as well as difficulty with ambulation and range of motion. The patient had localized pain mostly along the medial and lateral aspect of the knee, but states the pain was aggravated with weight-bearing activities. She does have some activity related swelling. The patient states she has not seen any significant improvement in her condition, despite the use of anti-inflammatory medications, intra-articular cortisone injections, as well as activity modification. At the time of surgery, she is not using any type of ambulation.   PHYSICAL EXAMINATION: GENERAL: A  well-developed, well-nourished female in no acute distress. Antalgic gait. Varus thrust to right knee.  HEENT: Atraumatic, normocephalic. Pupils equal, round and reactive to light.  NECK: Supple. Nontender, with good range of motion. No thyromegaly or adenopathy.  LUNGS: Clear to auscultation bilaterally.  CARDIOVASCULAR: Regular rate and rhythm. Normal S1, S2. No murmur. No appreciable gallops or rubs. Peripheral pulses are palpable.  ABDOMEN: Soft, nontender, nondistended.  EXTREMITIES: Good strength, stability and range of motion with upper extremities. Good range of motion of the hips and ankles. Right knee soft tissue swelling, moderate effusion, mild erythema, noncrepitant, mild tenderness medial joint line and lateral joint line. Alignment is a relative varus alignment. There is good tracking, without evidence of subluxation of the patella. There is good muscle tone within the quadriceps. Range of motion is 0 to 90 degrees. She is neurovascularly intact in the right lower extremity.   HOSPITAL COURSE: The patient was admitted to the hospital on 06/14/2014. She had surgery that same day and was brought to the orthopedic floor from the PACU in stable condition. On postoperative day 1, she had acute postoperative blood loss anemia, with a hemoglobin of 12.2, and then on postoperative day 2, hemoglobin slightly dropped to 11.5. The patient made good progress with physical therapy. She did live at home and she needed assistance. Due to patient needing assistance at home, she was sent to rehab facility. On postoperative day 3, the patient was doing well. She was stable and ready for discharge to a rehab facility. Vital signs were stable.   CONDITION AT DISCHARGE: Stable.   DISCHARGE INSTRUCTIONS: The patient may gradually increase weight-bearing on the affected extremity. Elevate the affected foot or leg on 1  or 2 pillows with the foot higher than the knee. Thigh-high TED hose on both legs and remove  one hour per eight hour shift. Elevate the heels off the bed. Incentive spirometer every hour while awake. Encourage cough and deep breathing. Continue using Polar Care unit, maintaining temperature between 40 and 50 degrees. Do not get the dressing or bandage wet or dirty. Call [facility] orthopedics if the dressing gets water under it. Leave the dressing on. Call [facility] orthopedics if any of the following occur: Bright red bleeding from the incision or wound, fever above 101.5 degrees, redness, swelling, or drainage at the incision. Call [facility] orthopedics if you experience any increased leg pain, numbness or weakness in legs, or bowel or bladder symptoms.   DISCHARGE MEDICATIONS: Please see discharge instructions for discharge medication list.     ____________________________ T. Rachelle Hora, PA-C tcg:cg D: 06/17/2014 07:51:40 ET T: 06/17/2014 09:14:18 ET JOB#: [id number]  cc: T. Rachelle Hora, PA-C, <Dictator> Duanne Guess [place] ELECTRONICALLY SIGNED 06/23/2014 17:45

## 2015-03-17 NOTE — Op Note (Signed)
PATIENT NAME:  Haley Love, Haley Love MR#:  [id number] DATE OF BIRTH:  27-Jan-1934  DATE OF PROCEDURE:  06/14/2014  PREOPERATIVE DIAGNOSIS: Degenerative arthrosis of the right knee.   POSTOPERATIVE DIAGNOSIS: Degenerative arthrosis of the right knee.   PROCEDURE PERFORMED: Right total knee arthroplasty using computer-assisted navigation.   SURGEON: Skip Estimable, M.D.   ASSISTANT:  Reche Dixon, PA (required to maintain retraction throughout the procedure).  ANESTHESIA: Spinal.   ESTIMATED BLOOD LOSS: 100 mL.   FLUIDS REPLACED: 2200 mL of crystalloid.   TOURNIQUET TIME: 85 minutes.   DRAINS: 2 medium drains to reinfusion system.   SOFT TISSUE RELEASES: Anterior cruciate ligament, posterior cruciate ligament, deep medial collateral ligament, and patellofemoral ligament.   IMPLANTS UTILIZED: DePuy PFC Sigma size 3 posterior stabilized femoral component (cemented), size 3 MBT tibial component (cemented), 35-mm 3-peg oval dome patella (cemented), and a 10-mm stabilized rotating platform polyethylene insert.   INDICATIONS FOR SURGERY: The patient is a 81 year old female who has been seen for complaints of progressive right knee pain. X-rays demonstrated degenerative changes in tricompartmental fashion. She did not see any significant improvement despite more conservative intervention. After discussion of the risks and benefits of surgical intervention, the patient expressed understanding of the risks and benefits and agreed with plans for surgical intervention.   PROCEDURE IN DETAIL: The patient was brought into the operating room and after adequate spinal anesthesia was achieved a tourniquet was placed on the patient's upper right thigh. The patient's right knee and leg were cleaned and prepped with alcohol and DuraPrep and draped in the usual sterile fashion. A "timeout" was performed as per usual protocol. The right lower extremity was exsanguinated using an Esmarch, and the tourniquet was inflated to  300 mmHg.   An anterior longitudinal incision was made followed by a standard mid vastus approach. A small effusion was evacuated. The deep fibers of the medial collateral ligament were elevated in a subperiosteal fashion off the medial flare of the tibia so as to maintain a continuous soft tissue sleeve. The patella was subluxed laterally and the patellofemoral ligament was incised. Inspection of the knee demonstrated significant degenerative changes with full-thickness loss of articular cartilage to both the medial and lateral compartments. Osteophytes were debrided using a rongeur. Anterior and posterior cruciate ligaments were excised. Two 4.0-mm Schanz pins were inserted into the femur and into the tibia for attachment of the array of trackers used for computer-assisted navigation. Hip center was identified using circumduction technique. Distal landmarks were mapped using the computer. The distal femur and proximal tibia were mapped using the computer.   Distal femoral cutting guide was positioned using computer-assisted navigation so as to achieve a 5 degrees distal valgus cut. Cut was performed and verified using the computer. Distal femur was sized and was felt that a size 3. Femoral component was appropriate. A size 3 cutting block was positioned and the anterior cut was performed and verified using the computer. This was followed by completion of the posterior and chamfer cuts. Femoral cutting guide for the central box was then positioned and the central box cut was performed.   Attention was then directed to the proximal tibia. Medial and lateral menisci were excised. The extramedullary tibial cutting guide was positioned using computer-assisted navigation so as to achieve 0-degree varus-valgus alignment and 0-degree posterior slope. Cut was performed and verified using the computer. The proximal tibia was sized and it was felt that a size 3 tibial tray was appropriate. Tibial and femoral trials  were  inserted followed by insertion of a 10-mm polyethylene trial. Excellent medial and lateral soft tissue balancing was appreciated both in full extension and in flexion. Finally, the patella was cut and prepared so as to accommodate a 35-mm 3-peg oval dome patella. Patellar trial was placed and the knee was placed through a range of motion with excellent patellar tracking appreciated.   Femoral trial was removed after debridement of posterior osteophytes. Central post hole for the tibial component was reamed followed by insertion of a keel punch. Tibial trial was then removed. Cut surfaces of bone were irrigated with copious amounts of normal saline with antibiotic solution using pulsatile lavage and then suctioned dry.   Polymethyl methacrylate cement was prepared in the usual fashion using a vacuum mixer. Cement was applied to the cut surface of the proximal tibia as well as along the undersurface of a size 3 MBT tibial component. The tibial component was positioned and impacted into place. Excess cement was removed using [employer] fast streamer. Cement was then applied to the cut surface of the femur as well as on the posterior flanges of a size 3 posterior-stabilized femoral component. Femoral component was positioned and impacted into place. Excess cement was removed using [employer] fast streamer. A 10-mm polyethylene trial was inserted and the knee was brought in full extension with steady axial compression applied. Finally, cement was applied to the backside of a 35-mm 3-peg oval dome patella and the patellar component was positioned and patellar clamp applied. Excess cement was removed using [employer] fast streamer.   After adequate curing of cement, the tourniquet was deflated after a total tourniquet time of 85 minutes. Hemostasis was achieved using electrocautery. The knee was irrigated with copious amounts of normal saline with antibiotic solution using pulsatile lavage then suctioned dry. The knee was inspected for  any residual cement debris.  Twenty mL of 1.3% Exparel and 40 mL of normal saline were injected along the posterior capsule, medial and lateral gutters, and along the arthrotomy site. A 10-mm stabilized rotating platform polyethylene insert was inserted and the knee was placed through a range of motion with excellent patellar tracking appreciated and excellent medial and lateral soft tissue balancing noted both in full extension and in flexion.   Two medium drains were placed in the wound bed and brought out through a separate stab incision to be attached to a reinfusion system. The medial parapatellar portion of the incision was reapproximated using interrupted sutures of #1 Vicryl. Then, 30 mL of 0.25% Marcaine with epinephrine were injected into the subcutaneous tissue along the incision site. The subcutaneous tissue was approximated in layers using first #0 Vicryl followed by #2-0 Vicryl. Skin was closed with skin staples. Sterile dressing was applied.   The patient tolerated the procedure well. She was transported to the recovery room in stable condition.    ____________________________ Laurice Record. Holley Bouche., MD jph:lt D: 06/15/2014 [id number] ET T: 06/15/2014 06:55:07 ET JOB#: [id number]  cc: Jeneen Rinks P. Holley Bouche., MD, <Dictator> JAMES P Holley Bouche MD ELECTRONICALLY SIGNED 06/16/2014 17:18

## 2015-06-15 ENCOUNTER — Other Ambulatory Visit: Payer: Self-pay | Admitting: Oncology

## 2015-06-15 ENCOUNTER — Other Ambulatory Visit: Payer: Self-pay | Admitting: Internal Medicine

## 2015-06-15 DIAGNOSIS — Z853 Personal history of malignant neoplasm of breast: Secondary | ICD-10-CM

## 2015-06-15 DIAGNOSIS — Z9889 Other specified postprocedural states: Secondary | ICD-10-CM

## 2015-07-05 ENCOUNTER — Other Ambulatory Visit: Payer: Self-pay | Admitting: Internal Medicine

## 2015-07-23 ENCOUNTER — Ambulatory Visit
Admission: RE | Admit: 2015-07-23 | Discharge: 2015-07-23 | Disposition: A | Discharge status: Home / Self Care | Payer: Commercial Managed Care - HMO | Source: Ambulatory Visit | Attending: Internal Medicine | Admitting: Internal Medicine

## 2015-07-23 DIAGNOSIS — Z9889 Other specified postprocedural states: Secondary | ICD-10-CM

## 2015-07-23 DIAGNOSIS — R928 Other abnormal and inconclusive findings on diagnostic imaging of breast: Secondary | ICD-10-CM | POA: Diagnosis not present

## 2015-07-23 DIAGNOSIS — Z853 Personal history of malignant neoplasm of breast: Secondary | ICD-10-CM

## 2015-07-26 ENCOUNTER — Other Ambulatory Visit (HOSPITAL_BASED_OUTPATIENT_CLINIC_OR_DEPARTMENT_OTHER): Payer: Commercial Managed Care - HMO

## 2015-07-26 DIAGNOSIS — D472 Monoclonal gammopathy: Secondary | ICD-10-CM | POA: Diagnosis not present

## 2015-07-26 DIAGNOSIS — C50812 Malignant neoplasm of overlapping sites of left female breast: Secondary | ICD-10-CM

## 2015-07-26 DIAGNOSIS — D631 Anemia in chronic kidney disease: Secondary | ICD-10-CM

## 2015-07-26 DIAGNOSIS — C50512 Malignant neoplasm of lower-outer quadrant of left female breast: Secondary | ICD-10-CM

## 2015-07-26 DIAGNOSIS — C50519 Malignant neoplasm of lower-outer quadrant of unspecified female breast: Secondary | ICD-10-CM | POA: Diagnosis not present

## 2015-07-26 DIAGNOSIS — N189 Chronic kidney disease, unspecified: Secondary | ICD-10-CM | POA: Diagnosis not present

## 2015-07-26 LAB — TECHNOLOGIST REVIEW

## 2015-07-26 LAB — CBC WITH DIFFERENTIAL/PLATELET
BASO%: 0.3 % (ref 0.0–2.0)
BASOS ABS: 0 10*3/uL (ref 0.0–0.1)
EOS%: 1.3 % (ref 0.0–7.0)
Eosinophils Absolute: 0.2 10*3/uL (ref 0.0–0.5)
HCT: 40.7 % (ref 34.8–46.6)
HGB: 13.9 g/dL (ref 11.6–15.9)
LYMPH%: 58.2 % — ABNORMAL HIGH (ref 14.0–49.7)
MCH: 30.2 pg (ref 25.1–34.0)
MCHC: 34.2 g/dL (ref 31.5–36.0)
MCV: 88.5 fL (ref 79.5–101.0)
MONO#: 0.8 10*3/uL (ref 0.1–0.9)
MONO%: 6.3 % (ref 0.0–14.0)
NEUT#: 4 10*3/uL (ref 1.5–6.5)
NEUT%: 33.9 % — AB (ref 38.4–76.8)
Platelets: 246 10*3/uL (ref 145–400)
RBC: 4.6 10*6/uL (ref 3.70–5.45)
RDW: 12.5 % (ref 11.2–14.5)
WBC: 11.9 10*3/uL — ABNORMAL HIGH (ref 3.9–10.3)
lymph#: 6.9 10*3/uL — ABNORMAL HIGH (ref 0.9–3.3)

## 2015-07-26 LAB — PROTEIN / CREATININE RATIO, URINE: Creatinine, Urine: 73.3 mg/dL

## 2015-07-26 LAB — COMPREHENSIVE METABOLIC PANEL (CC13)
ALBUMIN: 4 g/dL (ref 3.5–5.0)
ALK PHOS: 71 U/L (ref 40–150)
ALT: 30 U/L (ref 0–55)
ANION GAP: 8 meq/L (ref 3–11)
AST: 25 U/L (ref 5–34)
BILIRUBIN TOTAL: 0.42 mg/dL (ref 0.20–1.20)
BUN: 34 mg/dL — AB (ref 7.0–26.0)
CALCIUM: 10.2 mg/dL (ref 8.4–10.4)
CHLORIDE: 106 meq/L (ref 98–109)
CO2: 26 mEq/L (ref 22–29)
CREATININE: 1.4 mg/dL — AB (ref 0.6–1.1)
EGFR: 36 mL/min/{1.73_m2} — ABNORMAL LOW (ref >90)
Glucose: 114 mg/dl (ref 70–140)
Potassium: 4.6 mEq/L (ref 3.5–5.1)
Sodium: 140 mEq/L (ref 136–145)
TOTAL PROTEIN: 7.2 g/dL (ref 6.4–8.3)

## 2015-07-31 ENCOUNTER — Other Ambulatory Visit: Payer: Commercial Managed Care - HMO

## 2015-07-31 ENCOUNTER — Telehealth: Payer: Self-pay | Admitting: Oncology

## 2015-07-31 ENCOUNTER — Ambulatory Visit (HOSPITAL_BASED_OUTPATIENT_CLINIC_OR_DEPARTMENT_OTHER): Payer: Commercial Managed Care - HMO | Admitting: Oncology

## 2015-07-31 VITALS — BP 143/59 | HR 84 | Temp 98.4°F | Resp 18 | Ht 66.25 in | Wt 172.3 lb

## 2015-07-31 DIAGNOSIS — Z170 Estrogen receptor positive status [ER+]: Secondary | ICD-10-CM | POA: Diagnosis not present

## 2015-07-31 DIAGNOSIS — C50512 Malignant neoplasm of lower-outer quadrant of left female breast: Secondary | ICD-10-CM

## 2015-07-31 DIAGNOSIS — C50812 Malignant neoplasm of overlapping sites of left female breast: Secondary | ICD-10-CM

## 2015-07-31 DIAGNOSIS — D472 Monoclonal gammopathy: Secondary | ICD-10-CM | POA: Diagnosis not present

## 2015-07-31 LAB — PROTEIN ELECTROPHORESIS, SERUM
ABNORMAL PROTEIN BAND1: 0.4 g/dL
ALPHA-1-GLOBULIN: 0.2 g/dL (ref 0.2–0.3)
ALPHA-2-GLOBULIN: 0.8 g/dL (ref 0.5–0.9)
Albumin ELP: 4.3 g/dL (ref 3.8–4.8)
BETA 2: 0.3 g/dL (ref 0.2–0.5)
Beta Globulin: 0.4 g/dL (ref 0.4–0.6)
GAMMA GLOBULIN: 0.8 g/dL (ref 0.8–1.7)
Total Protein, Serum Electrophoresis: 6.9 g/dL (ref 6.1–8.1)

## 2015-07-31 LAB — KAPPA/LAMBDA LIGHT CHAINS
KAPPA FREE LGHT CHN: 1.81 mg/dL (ref 0.33–1.94)
Kappa:Lambda Ratio: 1.29 (ref 0.26–1.65)
LAMBDA FREE LGHT CHN: 1.4 mg/dL (ref 0.57–2.63)

## 2015-07-31 NOTE — Progress Notes (Signed)
ID: Haley Love   DOB: [birth year]  MR#: [id number]  EOF#:[id number]   PCP:  Dr. Deborra Medina, [facility] in [place]: left breast cancer; M-GUS, possible CLL  CURRENT THERAPY: observation  BREAST CANCER HISTORY: From the original intake note:  Alexismarie had screening mammography at the [facility] 06/17/2012 which showed a possible mass in the left breast. Left diagnostic mammography performed 07/02/2012 showed a persistent 1 cm spiculated mass in the lateral left breast, which was palpable and firm. Ultrasound showed an irregular hypoechoic mass measuring 8 mm. The left axilla was unremarkable.  Biopsy of the mass the same day ([id number]) showed an invasive ductal carcinoma, grade 1, estrogen receptor 100% progesterone receptor 39% positive, with no HER-2 amplification and an MIB-1 of 7%. Bilateral breast MRI were obtained 07/08/2012 this showed an irregular enhancing mass in the left breast, middle third, measuring 9 mm. There were no other suspicious masses in either breast and no axillary or internal mammary adenopathy.  The patient's subsequent history is as detailed below.  INTERVAL HISTORY: Haley Love returns today for follow up of her breast cancer and monoclonal gammopathy.  She continues to do well and asked what her worse problem is she says "I have no problems". She exercises chiefly by walking when it's not to hot and by dancing on Wednesdays.  REVIEW OF SYSTEMS: Shelbia specifically denies fever, rash, bleeding, unexplained fatigue, or unexplained weight loss. She is on multiple supplements. A detailed review of systems today was noncontributory  PAST MEDICAL HISTORY: Past Medical History  Diagnosis Date  . Breast cancer   . Hypertension   . Contact lens/glasses fitting   . Hyperlipidemia   . Inflammatory polyps of colon   . Cataract     PAST SURGICAL HISTORY: Past Surgical History  Procedure Laterality Date  . No past surgeries    . Breast surgery  9/13     lt lump  . Mastectomy, partial  08/06/2012    Procedure: MASTECTOMY PARTIAL;  Surgeon: Adin Hector, MD;  Location: [facility];  Service: General;  Laterality: Left;  Left partial mastectomy with re-excision of margins  . Cataracts  2014  . Knee arthroscopy w/ meniscal repair  09/23/11    Right  . Joint replacement Right July 2015    [place], right knee  Right knee replacement in late July 2015.  FAMILY HISTORY Family History  Problem Relation Age of Onset  . Lung cancer Mother   . Arthritis Mother   . Hyperlipidemia Mother   . Hypertension Mother   . Throat cancer Father   . Hyperlipidemia Father   . Cancer Paternal Aunt     breast   the patient's father died at the age of 5 from cancer of the throat which had been diagnosed 6 years earlier. The patient's mother died at the age of 58. She had cancer of the lung lining diagnosed at age 32. The patient had 4 brothers, no sisters. There is no other cancer history in the immediate family.  GYNECOLOGIC HISTORY: Menarche age 35. Change of life age 43. She took hormone replacement approximately 10 years. She is GX P2, with first live birth at age 17.  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 She is divorced. Her son Jenevieve Kirschbaum (goes by "J.") also lives in [place] and is self-employed running a Education administrator business. Daughter Janine Reller lives in [place]. She works as a Electrical engineer. The  patient has 3 grandchildren. She attends a [employer].  ADVANCED DIRECTIVES: In place  HEALTH MAINTENANCE: Social History  Substance Use Topics  . Smoking status: Never Smoker   . Smokeless tobacco: Never Used  . Alcohol Use: Yes     Comment: 1 glass wine week     Colonoscopy: 2012  PAP: 2011  Bone density: 2011  Lipid panel:  Allergies  Allergen Reactions  . No Known Allergies    OBJECTIVE: Older white woman in no acute distress  ECOG: 0  Filed  Vitals:   07/31/15 1416  BP: 143/59  Pulse: 84  Temp: 98.4 F (36.9 C)  Resp: 18  Body mass index is 27.59 kg/(m^2).  Sclerae unicteric, pupils round and equal Oropharynx clear and moist-- no thrush or other lesions No cervical or supraclavicular adenopathy, no axillary or inguinal adenopathy Lungs no rales or rhonchi Heart regular rate and rhythm Abd soft, nontender, positive bowel sounds MSK no focal spinal tenderness, no upper extremity lymphedema Neuro: nonfocal, well oriented, appropriate affect Breasts: The right breast is unremarkable. The left breast is status post lumpectomy. There is no evidence of local recurrence. The left axilla is benign.  Marland Kitchen LAB RESULTS: Lab Results  Component Value Date   WBC 11.9* 07/26/2015   NEUTROABS 4.0 07/26/2015   HGB 13.9 07/26/2015   HCT 40.7 07/26/2015   MCV 88.5 07/26/2015   PLT 246 07/26/2015      Chemistry      Component Value Date/Time   NA 140 07/26/2015 1015   NA 137 08/10/2014 1130   NA 136 06/16/2014 0617   K 4.6 07/26/2015 1015   K 4.7 08/10/2014 1130   K 4.5 06/16/2014 0617   CL 103 08/10/2014 1130   CL 103 06/16/2014 0617   CL 105 09/08/2012 1002   CO2 26 07/26/2015 1015   CO2 27 08/10/2014 1130   CO2 25 06/16/2014 0617   BUN 34.0* 07/26/2015 1015   BUN 30* 08/10/2014 1130   BUN 11 06/16/2014 0617   CREATININE 1.4* 07/26/2015 1015   CREATININE 1.4* 08/10/2014 1130   CREATININE 1.02 06/16/2014 0617      Component Value Date/Time   CALCIUM 10.2 07/26/2015 1015   CALCIUM 10.2 08/10/2014 1130   CALCIUM 9.9 08/10/2014 1130   CALCIUM 9.0 06/16/2014 0617   ALKPHOS 71 07/26/2015 1015   ALKPHOS 50 09/22/2013 0938   AST 25 07/26/2015 1015   AST 32 09/22/2013 0938   ALT 30 07/26/2015 1015   ALT 37* 09/22/2013 0938   BILITOT 0.42 07/26/2015 1015   BILITOT 0.8 09/22/2013 0938       Lab Results  Component Value Date   LABCA2 35 07/14/2012     STUDIES: Mm Diag Breast Tomo Bilateral  07/23/2015    CLINICAL DATA:  81 year old female presenting for routine yearly diagnostic mammogram status post lumpectomy in 2013.  EXAM: DIGITAL DIAGNOSTIC BILATERAL MAMMOGRAM WITH 3D TOMOSYNTHESIS AND CAD  COMPARISON:  Previous exam(s).  ACR Breast Density Category b: There are scattered areas of fibroglandular density.  FINDINGS: The lumpectomy site in the lateral left breast appears stable. There are no suspicious areas of distortion, calcifications or masses in the bilateral breasts.  Mammographic images were processed with CAD.  IMPRESSION: Stable left breast lumpectomy site. No mammographic evidence of malignancy in the bilateral breasts.  RECOMMENDATION: Diagnostic mammogram is suggested in 1 year. (Code:DM-B-01Y)  I have discussed the findings and recommendations with the patient. Results were also provided in writing at the  conclusion of the visit. If applicable, a reminder letter will be sent to the patient regarding the next appointment.  BI-RADS CATEGORY  2: Benign.   Electronically Signed   By: Ammie Ferrier M.D.   On: 07/23/2015 12:46     ASSESSMENT: 81 y.o. [name], [place] woman s/p Left lumpectomy 07/28/2012 for a pT1c NX, stage IA invasive ductal carcinoma, grade 1, estrogen receptor 100% and progesterone receptor 39% positive, with no HER-2 amplification and an MIB-1 of 7.   (1) initially positive margins were cleared 08/06/2012  (2) tamoxifen started October 2013, stopped March 2014 because of cramps and hip pain  (3) anastrozole started May 2014-- patient tells me she stopped after a few days "with the same symptoms as tamoxifen"  (4) patient chose to forego anti-estrogen therapy altogether, she will follow up in office until she is 5 years out from her definitive surgery.  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 She wanted to know about her "leukemia" and I pointed out that she does have approximately 6000 lymphocytes, which just about meets the current criteria for chronic lymphoid leukemia assuming these are clonal (which they probably are).   I really don't see any point in setting her up for flow cytometry for instance since we would not do anything about it except observe, which we are doing in any case. There is no indication for treatment either of that problem, or her monoclonal gammopathy, which is likewise entirely stable  From a breast cancer also she is now 3 years out from her definite surgery with no evidence of disease activity.  I did point out the slight drop in her kidney function. I urged her to drink a couple of reports of liquid daily.  She is going to see [place] again in one year, and we will recheck her labs and mammography results at that time. She knows to call for any problems that may develop before that visit. Salome Hautala C    07/31/2015

## 2015-07-31 NOTE — Telephone Encounter (Signed)
Gave avs & calendar for September 2017 °

## 2015-08-24 ENCOUNTER — Encounter: Payer: Commercial Managed Care - HMO | Admitting: Internal Medicine

## 2015-08-29 ENCOUNTER — Ambulatory Visit (INDEPENDENT_AMBULATORY_CARE_PROVIDER_SITE_OTHER): Payer: Commercial Managed Care - HMO | Admitting: Internal Medicine

## 2015-08-29 ENCOUNTER — Encounter: Payer: Self-pay | Admitting: Internal Medicine

## 2015-08-29 VITALS — BP 140/72 | HR 72 | Temp 98.1°F | Resp 12 | Ht 66.0 in | Wt 172.0 lb

## 2015-08-29 DIAGNOSIS — Z853 Personal history of malignant neoplasm of breast: Secondary | ICD-10-CM | POA: Diagnosis not present

## 2015-08-29 DIAGNOSIS — Z0000 Encounter for general adult medical examination without abnormal findings: Secondary | ICD-10-CM | POA: Diagnosis not present

## 2015-08-29 DIAGNOSIS — N189 Chronic kidney disease, unspecified: Secondary | ICD-10-CM

## 2015-08-29 DIAGNOSIS — N183 Chronic kidney disease, stage 3 unspecified: Secondary | ICD-10-CM

## 2015-08-29 DIAGNOSIS — D631 Anemia in chronic kidney disease: Secondary | ICD-10-CM | POA: Diagnosis not present

## 2015-08-29 DIAGNOSIS — M6530 Trigger finger, unspecified finger: Secondary | ICD-10-CM | POA: Diagnosis not present

## 2015-08-29 NOTE — Progress Notes (Signed)
Pre-visit discussion using our clinic review tool. No additional management support is needed unless otherwise documented below in the visit note.  

## 2015-08-29 NOTE — Progress Notes (Signed)
Patient ID: Haley Love, female    DOB: [birth year]  Age: 81 y.o. MRN: [id number]  The patient is here for annual Medicare wellness examination and management of other chronic and acute problems.   She has had her Annual  skin exam with Dr Kellie Moor and underwent Moh's procedure for skin CA on nose  Refuses flu and pneumonia vaccines    The risk factors are reflected in the social history.  The roster of all physicians providing medical care to patient - is listed in the Snapshot section of the chart.  Activities of daily living:  The patient is 100% independent in all ADLs: dressing, toileting, feeding as well as independent mobility  Home safety : The patient has smoke detectors in the home. They wear seatbelts.  There are no firearms at home. There is no violence in the home.   There is no risks for hepatitis, STDs or HIV. There is no   history of blood transfusion. They have no travel history to infectious disease endemic areas of the world.  The patient has seen their dentist in the last six month. They have seen their eye doctor in the last year. They admit to slight hearing difficulty with regard to whispered voices and some television programs.  They have deferred audiologic testing in the last year.  They do not  have excessive sun exposure. Discussed the need for sun protection: hats, long sleeves and use of sunscreen if there is significant sun exposure.   Diet: the importance of a healthy diet is discussed. They do have a healthy diet.  The benefits of regular aerobic exercise were discussed. She walks 4 times per week ,  20 minutes.   Depression screen: there are no signs or vegative symptoms of depression- irritability, change in appetite, anhedonia, sadness/tearfullness.  Cognitive assessment: the patient manages all their financial and personal affairs and is actively engaged. They could relate day,date,year and events; recalled 2/3 objects at 3 minutes; performed  clock-face test normally.  The following portions of the patient's history were reviewed and updated as appropriate: allergies, current medications, past family history, past medical history,  past surgical history, past social history  and problem list.  Visual acuity was not assessed per patient preference since she has regular follow up with her ophthalmologist. Hearing and body mass index were assessed and reviewed.   During the course of the visit the patient was educated and counseled about appropriate screening and preventive services including : fall prevention , diabetes screening, nutrition counseling, colorectal cancer screening, and recommended immunizations.    CC: The primary encounter diagnosis was History of breast cancer. Diagnoses of Trigger finger, acquired, CKD (chronic kidney disease), stage III, Anemia in chronic kidney disease, and Encounter for Medicare annual wellness exam were also pertinent to this visit.    She was referred  By nephrology to urology for evaluation of a possible mass seen on her bladder .  She underwent cystoscopy and no mass was found. She has been taking an herbal laxative daily, for years' She is s/p Right knee replacement .   Was successful and she has no residual knee pain,  But she is not walking due to heat.   She denies  edema   Some stress incontinence.  Left hand ring finger cannot  Extend due to trigger finger . Requesting trigger finger injection today   History Haley Love has a past medical history of Breast cancer ([place]); Hypertension; Contact lens/glasses fitting; Hyperlipidemia; Inflammatory polyps of colon (  [place]); and Cataract.   She has past surgical history that includes No past surgeries; Breast surgery (9/13); Mastectomy, partial (08/06/2012); cataracts (2014); Knee arthroscopy w/ meniscal repair (09/23/11); and Joint replacement (Right, July 2015).   Her family history includes Arthritis in her mother; Cancer in her paternal aunt;  Hyperlipidemia in her father and mother; Hypertension in her mother; Lung cancer in her mother; Throat cancer in her father.She reports that she has never smoked. She has never used smokeless tobacco. She reports that she drinks alcohol. She reports that she does not use illicit drugs.  Outpatient Prescriptions Prior to Visit  Medication Sig Dispense Refill  . aspirin 81 MG tablet Take 81 mg by mouth daily.    Marland Kitchen b complex vitamins tablet Take 1 tablet by mouth daily.    . B Complex-C (RA B-COMPLEX/VITAMIN C CR) TBCR Take by mouth.    Chong Sicilian, Borago officinalis, (BORAGE OIL PO) Take 1,000 mg by mouth daily.    . Calcium Carbonate-Vit D-Min (RA CALCIUM 600/VIT D/MINERALS) 600-200 MG-UNIT TABS Take tablets by mouth once a day.    . Cholecalciferol (VITAMIN D-3) 1000 UNITS CAPS Take 1 capsule by mouth at bedtime.    Marland Kitchen CINNAMON PO Take 2,000 mg by mouth at bedtime.    . Coenzyme Q10 (CO Q-10) 200 MG CAPS Take 1 capsule by mouth at bedtime.    . Flaxseed, Linseed, (FLAX SEED OIL) 1000 MG CAPS Take by mouth.    . Indole-3-Carbinol POWD Take 200 mg by mouth 2 (two) times daily.    Javier Docker Oil 1500 MG CAPS Take by mouth.    Marland Kitchen lisinopril-hydrochlorothiazide (PRINZIDE,ZESTORETIC) 10-12.5 MG per tablet TAKE 1 TABLET BY MOUTH DAILY. 90 tablet 2  . Magnesium 500 MG CAPS Take 1 capsule by mouth daily.     . Misc Natural Products (NARCOSOFT HERBAL LAX PO) Take 2 tablets by mouth at bedtime.     . Nutritional Supplements (NUTRITIONAL SUPPLEMENT PO) Take 3 capsules by mouth 2 (two) times daily. Neprinol AFD>    . Red Yeast Rice Extract (RED YEAST RICE PO) Take 1,000 mg by mouth 2 (two) times daily.    . vitamin E 400 UNIT capsule Take 400 Units by mouth daily.    . TDaP (BOOSTRIX) 5-2.5-18.5 LF-MCG/0.5 injection Inject 0.5 mLs into the muscle once. (Patient not taking: Reported on 08/29/2015) 0.5 mL 0   No facility-administered medications prior to visit.    Review of Systems   Patient denies headache,  fevers, malaise, unintentional weight loss, skin rash, eye pain, sinus congestion and sinus pain, sore throat, dysphagia,  hemoptysis , cough, dyspnea, wheezing, chest pain, palpitations, orthopnea, edema, abdominal pain, nausea, melena, diarrhea, constipation, flank pain, dysuria, hematuria, urinary  Frequency, nocturia, numbness, tingling, seizures,  Focal weakness, Loss of consciousness,  Tremor, insomnia, depression, anxiety, and suicidal ideation.     Objective:  BP 140/72 mmHg  Pulse 72  Temp(Src) 98.1 F (36.7 C) (Oral)  Resp 12  Ht 5\' 6"  (1.676 m)  Wt 172 lb (78.019 kg)  BMI 27.77 kg/m2  SpO2 97%  Physical Exam   General appearance: alert, cooperative and appears stated age Ears: normal TM's and external ear canals both ears Throat: lips, mucosa, and tongue normal; teeth and gums normal Neck: no adenopathy, no carotid bruit, supple, symmetrical, trachea midline and thyroid not enlarged, symmetric, no tenderness/mass/nodules Back: symmetric, no curvature. ROM normal. No CVA tenderness. Lungs: clear to auscultation bilaterally Heart: regular rate and rhythm, S1, S2 normal, no  murmur, click, rub or gallop Abdomen: soft, non-tender; bowel sounds normal; no masses,  no organomegaly Pulses: 2+ and symmetric Skin: Skin color, texture, turgor normal. No rashes or lesions Lymph nodes: Cervical, supraclavicular, and axillary nodes normal. MSK: Left hand ring finger flexed against palm    Assessment & Plan:   Problem List Items Addressed This Visit    Encounter for Medicare annual wellness exam    .Annual Medicare wellness  exam was done as well as a comprehensive physical exam and management of acute and chronic conditions .  During the course of the visit the patient was educated and counseled about appropriate screening and preventive services including : fall prevention , diabetes screening, nutrition counseling, colorectal cancer screening, and recommended immunizations.  Printed  recommendations for health maintenance screenings was given.         Anemia in chronic kidney disease     Repeat hgb  is normal but persistent leukocytosis noted.   Lab Results  Component Value Date   WBC 11.9* 07/26/2015   HGB 13.9 07/26/2015   HCT 40.7 07/26/2015   MCV 88.5 07/26/2015   PLT 246 07/26/2015           CKD (chronic kidney disease), stage III    Stable.  referral to  Nephrology was done last year and labs reviewed.  Reminded to avoid daily use of aleve and other NSAIDs .  Continue lisinopril. Lab Results  Component Value Date   CREATININE 1.4* 07/26/2015           Trigger finger, acquired    Referral to orthopedics for treatment      Relevant Orders   Ambulatory referral to Orthopedic Surgery    Other Visit Diagnoses    History of breast cancer    -  Primary    Relevant Orders    MM DIAG BREAST TOMO BILATERAL       I am having Ms. Frutoso Chase maintain her aspirin, CINNAMON PO, Misc Natural Products (NARCOSOFT HERBAL LAX PO), Co Q-10, Vitamin D-3, Flax Seed Oil, vitamin E, b complex vitamins, Krill Oil, Red Yeast Rice Extract (RED YEAST RICE PO), Magnesium, (Borage, Borago officinalis, (BORAGE OIL PO)), Tdap, RA B-COMPLEX/VITAMIN C CR, RA CALCIUM 600/VIT D/MINERALS, Indole-3-Carbinol, Nutritional Supplements (NUTRITIONAL SUPPLEMENT PO), and lisinopril-hydrochlorothiazide.  No orders of the defined types were placed in this encounter.    There are no discontinued medications.  Follow-up: Return in about 3 months (around 11/29/2015).   Crecencio Mc, MD

## 2015-08-29 NOTE — Patient Instructions (Signed)
Health Maintenance, Female Adopting a healthy lifestyle and getting preventive care can go a long way to promote health and wellness. Talk with your health care provider about what schedule of regular examinations is right for you. This is a good chance for you to check in with your provider about disease prevention and staying healthy. In between checkups, there are plenty of things you can do on your own. Experts have done a lot of research about which lifestyle changes and preventive measures are most likely to keep you healthy. Ask your health care provider for more information. WEIGHT AND DIET  Eat a healthy diet  Be sure to include plenty of vegetables, fruits, low-fat dairy products, and lean protein.  Do not eat a lot of foods high in solid fats, added sugars, or salt.  Get regular exercise. This is one of the most important things you can do for your health.  Most adults should exercise for at least 150 minutes each week. The exercise should increase your heart rate and make you sweat (moderate-intensity exercise).  Most adults should also do strengthening exercises at least twice a week. This is in addition to the moderate-intensity exercise.  Maintain a healthy weight  Body mass index (BMI) is a measurement that can be used to identify possible weight problems. It estimates body fat based on height and weight. Your health care provider can help determine your BMI and help you achieve or maintain a healthy weight.  For females 20 years of age and older:   A BMI below 18.5 is considered underweight.  A BMI of 18.5 to 24.9 is normal.  A BMI of 25 to 29.9 is considered overweight.  A BMI of 30 and above is considered obese.  Watch levels of cholesterol and blood lipids  You should start having your blood tested for lipids and cholesterol at 79 years of age, then have this test every 5 years.  You may need to have your cholesterol levels checked more often if:  Your lipid  or cholesterol levels are high.  You are older than 79 years of age.  You are at high risk for heart disease.  CANCER SCREENING   Lung Cancer  Lung cancer screening is recommended for adults 55-80 years old who are at high risk for lung cancer because of a history of smoking.  A yearly low-dose CT scan of the lungs is recommended for people who:  Currently smoke.  Have quit within the past 15 years.  Have at least a 30-pack-year history of smoking. A pack year is smoking an average of one pack of cigarettes a day for 1 year.  Yearly screening should continue until it has been 15 years since you quit.  Yearly screening should stop if you develop a health problem that would prevent you from having lung cancer treatment.  Breast Cancer  Practice breast self-awareness. This means understanding how your breasts normally appear and feel.  It also means doing regular breast self-exams. Let your health care provider know about any changes, no matter how small.  If you are in your 20s or 30s, you should have a clinical breast exam (CBE) by a health care provider every 1-3 years as part of a regular health exam.  If you are 40 or older, have a CBE every year. Also consider having a breast X-ray (mammogram) every year.  If you have a family history of breast cancer, talk to your health care provider about genetic screening.  If you   are at high risk for breast cancer, talk to your health care provider about having an MRI and a mammogram every year.  Breast cancer gene (BRCA) assessment is recommended for women who have family members with BRCA-related cancers. BRCA-related cancers include:  Breast.  Ovarian.  Tubal.  Peritoneal cancers.  Results of the assessment will determine the need for genetic counseling and BRCA1 and BRCA2 testing. Cervical Cancer Your health care provider may recommend that you be screened regularly for cancer of the pelvic organs (ovaries, uterus, and  vagina). This screening involves a pelvic examination, including checking for microscopic changes to the surface of your cervix (Pap test). You may be encouraged to have this screening done every 3 years, beginning at age 21.  For women ages 30-65, health care providers may recommend pelvic exams and Pap testing every 3 years, or they may recommend the Pap and pelvic exam, combined with testing for human papilloma virus (HPV), every 5 years. Some types of HPV increase your risk of cervical cancer. Testing for HPV may also be done on women of any age with unclear Pap test results.  Other health care providers may not recommend any screening for nonpregnant women who are considered low risk for pelvic cancer and who do not have symptoms. Ask your health care provider if a screening pelvic exam is right for you.  If you have had past treatment for cervical cancer or a condition that could lead to cancer, you need Pap tests and screening for cancer for at least 20 years after your treatment. If Pap tests have been discontinued, your risk factors (such as having a new sexual partner) need to be reassessed to determine if screening should resume. Some women have medical problems that increase the chance of getting cervical cancer. In these cases, your health care provider may recommend more frequent screening and Pap tests. Colorectal Cancer  This type of cancer can be detected and often prevented.  Routine colorectal cancer screening usually begins at 79 years of age and continues through 79 years of age.  Your health care provider may recommend screening at an earlier age if you have risk factors for colon cancer.  Your health care provider may also recommend using home test kits to check for hidden blood in the stool.  A small camera at the end of a tube can be used to examine your colon directly (sigmoidoscopy or colonoscopy). This is done to check for the earliest forms of colorectal  cancer.  Routine screening usually begins at age 50.  Direct examination of the colon should be repeated every 5-10 years through 79 years of age. However, you may need to be screened more often if early forms of precancerous polyps or small growths are found. Skin Cancer  Check your skin from head to toe regularly.  Tell your health care provider about any new moles or changes in moles, especially if there is a change in a mole's shape or color.  Also tell your health care provider if you have a mole that is larger than the size of a pencil eraser.  Always use sunscreen. Apply sunscreen liberally and repeatedly throughout the day.  Protect yourself by wearing long sleeves, pants, a wide-brimmed hat, and sunglasses whenever you are outside. HEART DISEASE, DIABETES, AND HIGH BLOOD PRESSURE   High blood pressure causes heart disease and increases the risk of stroke. High blood pressure is more likely to develop in:  People who have blood pressure in the high end   of the normal range (130-139/85-89 mm Hg).  People who are overweight or obese.  People who are African American.  If you are 38-23 years of age, have your blood pressure checked every 3-5 years. If you are 61 years of age or older, have your blood pressure checked every year. You should have your blood pressure measured twice--once when you are at a hospital or clinic, and once when you are not at a hospital or clinic. Record the average of the two measurements. To check your blood pressure when you are not at a hospital or clinic, you can use:  An automated blood pressure machine at a pharmacy.  A home blood pressure monitor.  If you are between 45 years and 39 years old, ask your health care provider if you should take aspirin to prevent strokes.  Have regular diabetes screenings. This involves taking a blood sample to check your fasting blood sugar level.  If you are at a normal weight and have a low risk for diabetes,  have this test once every three years after 79 years of age.  If you are overweight and have a high risk for diabetes, consider being tested at a younger age or more often. PREVENTING INFECTION  Hepatitis B  If you have a higher risk for hepatitis B, you should be screened for this virus. You are considered at high risk for hepatitis B if:  You were born in a country where hepatitis B is common. Ask your health care provider which countries are considered high risk.  Your parents were born in a high-risk country, and you have not been immunized against hepatitis B (hepatitis B vaccine).  You have HIV or AIDS.  You use needles to inject street drugs.  You live with someone who has hepatitis B.  You have had sex with someone who has hepatitis B.  You get hemodialysis treatment.  You take certain medicines for conditions, including cancer, organ transplantation, and autoimmune conditions. Hepatitis C  Blood testing is recommended for:  Everyone born from [birth year] through 1963.  Anyone with known risk factors for hepatitis C. Sexually transmitted infections (STIs)  You should be screened for sexually transmitted infections (STIs) including gonorrhea and chlamydia if:  You are sexually active and are younger than 79 years of age.  You are older than 79 years of age and your health care provider tells you that you are at risk for this type of infection.  Your sexual activity has changed since you were last screened and you are at an increased risk for chlamydia or gonorrhea. Ask your health care provider if you are at risk.  If you do not have HIV, but are at risk, it may be recommended that you take a prescription medicine daily to prevent HIV infection. This is called pre-exposure prophylaxis (PrEP). You are considered at risk if:  You are sexually active and do not regularly use condoms or know the HIV status of your partner(s).  You take drugs by injection.  You are sexually  active with a partner who has HIV. Talk with your health care provider about whether you are at high risk of being infected with HIV. If you choose to begin PrEP, you should first be tested for HIV. You should then be tested every 3 months for as long as you are taking PrEP.  PREGNANCY   If you are premenopausal and you may become pregnant, ask your health care provider about preconception counseling.  If you may  become pregnant, take 400 to 800 micrograms (mcg) of folic acid every day.  If you want to prevent pregnancy, talk to your health care provider about birth control (contraception). OSTEOPOROSIS AND MENOPAUSE   Osteoporosis is a disease in which the bones lose minerals and strength with aging. This can result in serious bone fractures. Your risk for osteoporosis can be identified using a bone density scan.  If you are 61 years of age or older, or if you are at risk for osteoporosis and fractures, ask your health care provider if you should be screened.  Ask your health care provider whether you should take a calcium or vitamin D supplement to lower your risk for osteoporosis.  Menopause may have certain physical symptoms and risks.  Hormone replacement therapy may reduce some of these symptoms and risks. Talk to your health care provider about whether hormone replacement therapy is right for you.  HOME CARE INSTRUCTIONS   Schedule regular health, dental, and eye exams.  Stay current with your immunizations.   Do not use any tobacco products including cigarettes, chewing tobacco, or electronic cigarettes.  If you are pregnant, do not drink alcohol.  If you are breastfeeding, limit how much and how often you drink alcohol.  Limit alcohol intake to no more than 1 drink per day for nonpregnant women. One drink equals 12 ounces of beer, 5 ounces of wine, or 1 ounces of hard liquor.  Do not use street drugs.  Do not share needles.  Ask your health care provider for help if  you need support or information about quitting drugs.  Tell your health care provider if you often feel depressed.  Tell your health care provider if you have ever been abused or do not feel safe at home.   This information is not intended to replace advice given to you by your health care provider. Make sure you discuss any questions you have with your health care provider.   Document Released: 05/26/2011 Document Revised: 12/01/2014 Document Reviewed: 10/12/2013 Elsevier Interactive Patient Education [employer].

## 2015-08-31 DIAGNOSIS — M6530 Trigger finger, unspecified finger: Secondary | ICD-10-CM | POA: Diagnosis not present

## 2015-08-31 NOTE — Assessment & Plan Note (Signed)
Repeat hgb  is normal but persistent leukocytosis noted.   Lab Results  Component Value Date   WBC 11.9* 07/26/2015   HGB 13.9 07/26/2015   HCT 40.7 07/26/2015   MCV 88.5 07/26/2015   PLT 246 07/26/2015

## 2015-08-31 NOTE — Assessment & Plan Note (Signed)

## 2015-08-31 NOTE — Assessment & Plan Note (Signed)
Referral to orthopedics for treatment

## 2015-08-31 NOTE — Assessment & Plan Note (Signed)
Stable.  referral to  Nephrology was done last year and labs reviewed.  Reminded to avoid daily use of aleve and other NSAIDs .  Continue lisinopril. Lab Results  Component Value Date   CREATININE 1.4* 07/26/2015

## 2015-09-03 DIAGNOSIS — M6530 Trigger finger, unspecified finger: Secondary | ICD-10-CM | POA: Diagnosis not present

## 2015-11-13 ENCOUNTER — Telehealth: Payer: Self-pay

## 2015-11-13 NOTE — Addendum Note (Signed)
Addended by: Crecencio Mc on: 11/13/2015 11:02 AM   Modules accepted: Miquel Dunn

## 2015-11-13 NOTE — Telephone Encounter (Signed)
Authorization was done on 08/29/2015 and was sent over with her office notes. Called Cindy @ [facility]. Had to lvm with authorization number.

## 2015-11-13 NOTE — Telephone Encounter (Signed)
Cindy from [place] office needs a referral sent over for pt's surgery on her Left ring finger and have it back dated to 09/03/15.

## 2015-11-13 NOTE — Telephone Encounter (Signed)
This referral was done on oct 5th during her physical.   Melissa please handle  Call

## 2015-11-22 ENCOUNTER — Other Ambulatory Visit (INDEPENDENT_AMBULATORY_CARE_PROVIDER_SITE_OTHER): Payer: Commercial Managed Care - HMO

## 2015-11-22 DIAGNOSIS — D72829 Elevated white blood cell count, unspecified: Secondary | ICD-10-CM

## 2015-11-22 DIAGNOSIS — D631 Anemia in chronic kidney disease: Secondary | ICD-10-CM

## 2015-11-22 DIAGNOSIS — E785 Hyperlipidemia, unspecified: Secondary | ICD-10-CM

## 2015-11-22 DIAGNOSIS — N189 Chronic kidney disease, unspecified: Secondary | ICD-10-CM

## 2015-11-22 LAB — CBC WITH DIFFERENTIAL/PLATELET
BASOS PCT: 0.4 % (ref 0.0–3.0)
Basophils Absolute: 0 10*3/uL (ref 0.0–0.1)
EOS ABS: 0.2 10*3/uL (ref 0.0–0.7)
EOS PCT: 1.9 % (ref 0.0–5.0)
HCT: 42.7 % (ref 36.0–46.0)
Hemoglobin: 14.3 g/dL (ref 12.0–15.0)
LYMPHS ABS: 7.5 10*3/uL — AB (ref 0.7–4.0)
Lymphocytes Relative: 60.3 % — ABNORMAL HIGH (ref 12.0–46.0)
MCHC: 33.4 g/dL (ref 30.0–36.0)
MCV: 87 fl (ref 78.0–100.0)
MONO ABS: 0.7 10*3/uL (ref 0.1–1.0)
Monocytes Relative: 5.6 % (ref 3.0–12.0)
NEUTROS PCT: 31.8 % — AB (ref 43.0–77.0)
Neutro Abs: 3.9 10*3/uL (ref 1.4–7.7)
Platelets: 282 10*3/uL (ref 150.0–400.0)
RBC: 4.91 Mil/uL (ref 3.87–5.11)
RDW: 12.8 % (ref 11.5–15.5)
WBC: 12.6 10*3/uL — ABNORMAL HIGH (ref 4.0–10.5)

## 2015-11-22 LAB — COMPREHENSIVE METABOLIC PANEL
ALT: 38 U/L — ABNORMAL HIGH (ref 0–35)
AST: 34 U/L (ref 0–37)
Albumin: 4 g/dL (ref 3.5–5.2)
Alkaline Phosphatase: 64 U/L (ref 39–117)
BUN: 28 mg/dL — AB (ref 6–23)
CHLORIDE: 103 meq/L (ref 96–112)
CO2: 28 meq/L (ref 19–32)
Calcium: 9.7 mg/dL (ref 8.4–10.5)
Creatinine, Ser: 1.15 mg/dL (ref 0.40–1.20)
GFR: 48.38 mL/min — ABNORMAL LOW (ref >60.00)
GLUCOSE: 129 mg/dL — AB (ref 70–99)
POTASSIUM: 4.7 meq/L (ref 3.5–5.1)
SODIUM: 139 meq/L (ref 135–145)
Total Bilirubin: 0.4 mg/dL (ref 0.2–1.2)
Total Protein: 6.8 g/dL (ref 6.0–8.3)

## 2015-11-22 LAB — LIPID PANEL
CHOL/HDL RATIO: 7
Cholesterol: 227 mg/dL — ABNORMAL HIGH (ref 0–200)
HDL: 32.3 mg/dL — AB (ref >39.00)
NONHDL: 194.71
TRIGLYCERIDES: 283 mg/dL — AB (ref 0.0–149.0)
VLDL: 56.6 mg/dL — AB (ref 0.0–40.0)

## 2015-11-22 LAB — HEMOGLOBIN A1C: Hgb A1c MFr Bld: 6.5 % (ref 4.6–6.5)

## 2015-11-22 LAB — LDL CHOLESTEROL, DIRECT: Direct LDL: 148 mg/dL

## 2015-11-22 LAB — TSH: TSH: 1.37 u[IU]/mL (ref 0.35–4.50)

## 2015-11-22 LAB — VITAMIN D 25 HYDROXY (VIT D DEFICIENCY, FRACTURES): VITD: 56.24 ng/mL (ref 30.00–100.00)

## 2015-11-27 ENCOUNTER — Encounter: Payer: Self-pay | Admitting: *Deleted

## 2015-12-17 ENCOUNTER — Ambulatory Visit (INDEPENDENT_AMBULATORY_CARE_PROVIDER_SITE_OTHER): Payer: Commercial Managed Care - HMO | Admitting: Internal Medicine

## 2015-12-17 ENCOUNTER — Encounter: Payer: Self-pay | Admitting: Internal Medicine

## 2015-12-17 VITALS — BP 140/82 | HR 88 | Temp 97.8°F | Resp 12 | Ht 66.0 in | Wt 173.4 lb

## 2015-12-17 DIAGNOSIS — R1013 Epigastric pain: Secondary | ICD-10-CM | POA: Diagnosis not present

## 2015-12-17 DIAGNOSIS — E785 Hyperlipidemia, unspecified: Secondary | ICD-10-CM | POA: Diagnosis not present

## 2015-12-17 LAB — COMPREHENSIVE METABOLIC PANEL
ALBUMIN: 4.2 g/dL (ref 3.5–5.2)
ALT: 21 U/L (ref 0–35)
AST: 22 U/L (ref 0–37)
Alkaline Phosphatase: 72 U/L (ref 39–117)
BUN: 19 mg/dL (ref 6–23)
CHLORIDE: 99 meq/L (ref 96–112)
CO2: 28 meq/L (ref 19–32)
CREATININE: 1.02 mg/dL (ref 0.40–1.20)
Calcium: 9.5 mg/dL (ref 8.4–10.5)
GFR: 55.55 mL/min — ABNORMAL LOW (ref >60.00)
Glucose, Bld: 107 mg/dL — ABNORMAL HIGH (ref 70–99)
POTASSIUM: 4.4 meq/L (ref 3.5–5.1)
SODIUM: 137 meq/L (ref 135–145)
Total Bilirubin: 0.4 mg/dL (ref 0.2–1.2)
Total Protein: 7.2 g/dL (ref 6.0–8.3)

## 2015-12-17 LAB — LIPASE: LIPASE: 20 U/L (ref 11.0–59.0)

## 2015-12-17 LAB — H. PYLORI ANTIBODY, IGG: H Pylori IgG: NEGATIVE

## 2015-12-17 NOTE — Patient Instructions (Signed)
You can lower your triglycerides with a lower carb diet.  Here a few low carb breads to use at home   Ezekiel bread ( found in in the frozen section )    Joseph's  Lavash (flat bread,  Thin) and  pita bread      Your pain may be due to an ulcer or due to a gallbadder problem.  I am ordering Labs today to evaluate gallbladder, liver and stomach  Ultrasound to be ordered.  We will use the [facility] in [place]

## 2015-12-17 NOTE — Progress Notes (Signed)
Subjective:  Patient ID: Haley Love, female    DOB: [birth year]  Age: 82 y.o. MRN: [id number]  CC: The primary encounter diagnosis was Abdominal pain, epigastric. A diagnosis of Hyperlipidemia with target LDL less than 100 was also pertinent to this visit.  HPI Haley Love presents for recent episode of postprandial abd  pain that radiated to back and lasted 24 hours.   Occurred an hour after eating her homemade  potato soup, along with Cheese n crackers.  Pain was Very severe  And started in her right thoracic back and radiated to both sides of abdomen. No nausea or abdominal distension.  Tried belching did not help,  But started to ease off by nightfall.  Was able to eat dinner that night without recurrence of pain. Area was tender to even light palpation for several days, and currently feels tednerness in the  mid epigastric region,  not sharp more like a deep catch or ache. No change in bowel pattern or consistency.  No unintentional weight loss .   No regular or recent use of Aleve,  Motrin or other NSAIDs.  Takes a baby aspirin daily . Has been taking a probiotic  green supplement for the past 6 months .      Outpatient Prescriptions Prior to Visit  Medication Sig Dispense Refill  . aspirin 81 MG tablet Take 81 mg by mouth daily.    Marland Kitchen b complex vitamins tablet Take 1 tablet by mouth daily.    . B Complex-C (RA B-COMPLEX/VITAMIN C CR) TBCR Take by mouth.    Chong Sicilian, Borago officinalis, (BORAGE OIL PO) Take 1,000 mg by mouth daily.    . Calcium Carbonate-Vit D-Min (RA CALCIUM 600/VIT D/MINERALS) 600-200 MG-UNIT TABS Take tablets by mouth once a day.    . Cholecalciferol (VITAMIN D-3) 1000 UNITS CAPS Take 1 capsule by mouth at bedtime.    Marland Kitchen CINNAMON PO Take 2,000 mg by mouth at bedtime.    . Coenzyme Q10 (CO Q-10) 200 MG CAPS Take 1 capsule by mouth at bedtime.    . Flaxseed, Linseed, (FLAX SEED OIL) 1000 MG CAPS Take by mouth.    . Indole-3-Carbinol POWD Take 200 mg by mouth 2  (two) times daily.    Javier Docker Oil 1500 MG CAPS Take by mouth.    Marland Kitchen lisinopril-hydrochlorothiazide (PRINZIDE,ZESTORETIC) 10-12.5 MG per tablet TAKE 1 TABLET BY MOUTH DAILY. 90 tablet 2  . Magnesium 500 MG CAPS Take 1 capsule by mouth daily.     . Misc Natural Products (NARCOSOFT HERBAL LAX PO) Take 2 tablets by mouth at bedtime.     . Nutritional Supplements (NUTRITIONAL SUPPLEMENT PO) Take 3 capsules by mouth 2 (two) times daily. Neprinol AFD>    . Red Yeast Rice Extract (RED YEAST RICE PO) Take 1,000 mg by mouth 2 (two) times daily.    . vitamin E 400 UNIT capsule Take 400 Units by mouth daily.    . TDaP (BOOSTRIX) 5-2.5-18.5 LF-MCG/0.5 injection Inject 0.5 mLs into the muscle once. (Patient not taking: Reported on 12/17/2015) 0.5 mL 0   No facility-administered medications prior to visit.    Review of Systems;  Patient denies headache, fevers, malaise, unintentional weight loss, skin rash, eye pain, sinus congestion and sinus pain, sore throat, dysphagia,  hemoptysis , cough, dyspnea, wheezing, chest pain, palpitations, orthopnea, edema, abdominal pain, nausea, melena, diarrhea, constipation, flank pain, dysuria, hematuria, urinary  Frequency, nocturia, numbness, tingling, seizures,  Focal weakness, Loss of consciousness,  Tremor,  insomnia, depression, anxiety, and suicidal ideation.      Objective:  BP 140/82 mmHg  Pulse 88  Temp(Src) 97.8 F (36.6 C) (Oral)  Resp 12  Ht 5' 6" (1.676 m)  Wt 173 lb 6 oz (78.642 kg)  BMI 28.00 kg/m2  SpO2 98%  BP Readings from Last 3 Encounters:  12/17/15 140/82  08/29/15 140/72  07/31/15 143/59    Wt Readings from Last 3 Encounters:  12/17/15 173 lb 6 oz (78.642 kg)  08/29/15 172 lb (78.019 kg)  07/31/15 172 lb 4.8 oz (78.155 kg)    General appearance: alert, cooperative and appears stated age Ears: normal TM's and external ear canals both ears Throat: lips, mucosa, and tongue normal; teeth and gums normal Neck: no adenopathy, no carotid  bruit, supple, symmetrical, trachea midline and thyroid not enlarged, symmetric, no tenderness/mass/nodules Back: symmetric, no curvature. ROM normal. No CVA tenderness. Lungs: clear to auscultation bilaterally Heart: regular rate and rhythm, S1, S2 normal, no murmur, click, rub or gallop Abdomen: soft, non-tender; bowel sounds normal; no masses,  no organomegaly Pulses: 2+ and symmetric Skin: Skin color, texture, turgor normal. No rashes or lesions Lymph nodes: Cervical, supraclavicular, and axillary nodes normal.  Lab Results  Component Value Date   HGBA1C 6.5 11/22/2015   HGBA1C 6.3 08/10/2014   HGBA1C 6.3 12/30/2012    Lab Results  Component Value Date   CREATININE 1.02 12/17/2015   CREATININE 1.15 11/22/2015   CREATININE 1.4* 07/26/2015    Lab Results  Component Value Date   WBC 12.6 Repeated and verified X2.* 11/22/2015   HGB 14.3 11/22/2015   HCT 42.7 11/22/2015   PLT 282.0 11/22/2015   GLUCOSE 107* 12/17/2015   CHOL 227* 11/22/2015   TRIG 283.0* 11/22/2015   HDL 32.30* 11/22/2015   LDLDIRECT 148.0 11/22/2015   ALT 21 12/17/2015   AST 22 12/17/2015   NA 137 12/17/2015   K 4.4 12/17/2015   CL 99 12/17/2015   CREATININE 1.02 12/17/2015   BUN 19 12/17/2015   CO2 28 12/17/2015   TSH 1.37 11/22/2015   INR 1.0 05/31/2014   HGBA1C 6.5 11/22/2015    Mm Diag Breast Tomo Bilateral  07/23/2015  CLINICAL DATA:  80-year-old female presenting for routine yearly diagnostic mammogram status post lumpectomy in 2013. EXAM: DIGITAL DIAGNOSTIC BILATERAL MAMMOGRAM WITH 3D TOMOSYNTHESIS AND CAD COMPARISON:  Previous exam(s). ACR Breast Density Category b: There are scattered areas of fibroglandular density. FINDINGS: The lumpectomy site in the lateral left breast appears stable. There are no suspicious areas of distortion, calcifications or masses in the bilateral breasts. Mammographic images were processed with CAD. IMPRESSION: Stable left breast lumpectomy site. No mammographic  evidence of malignancy in the bilateral breasts. RECOMMENDATION: Diagnostic mammogram is suggested in 1 year. (Code:[id number]-B-01Y) I have discussed the findings and recommendations with the patient. Results were also provided in writing at the conclusion of the visit. If applicable, a reminder letter will be sent to the patient regarding the next appointment. BI-RADS CATEGORY  2: Benign. Electronically Signed   By: Michelle  Collins M.D.   On: 07/23/2015 12:46    Assessment & Plan:   Problem List Items Addressed This Visit    Hyperlipidemia with target LDL less than 100    Managed with Red Yeast rice per patient preference despite discussion of 10 yr risk of CAD.  Low GI diet recommended and discussed for management of weight gain and  elevated triglycerides.   Lab Results  Component Value Date   CHOL   227* 11/22/2015   HDL 32.30* 11/22/2015   LDLDIRECT 148.0 11/22/2015   TRIG 283.0* 11/22/2015   CHOLHDL 7 11/22/2015    .      Abdominal pain, epigastric - Primary    She remains tender to palpation in the epigastric and RUQ on exam today .  Consider gastritis vs choledocholithiasis.  abd ultrasound ordered.  H pylori, lipase and alk phos/ALT/ALT are all normal.  Adding PPI for one month trial  .  Lab Results  Component Value Date   ALT 21 12/17/2015   AST 22 12/17/2015   ALKPHOS 72 12/17/2015   BILITOT 0.4 12/17/2015   .l      Relevant Orders   Comprehensive metabolic panel (Completed)   Lipase (Completed)   H. pylori antibody, IgG (Completed)   US Abdomen Complete     A total of 25 minutes of face to face time was spent with patient more than half of which was spent in counselling about the above mentioned conditions  and coordination of care  I am having Ms. Abebe start on pantoprazole. I am also having her maintain her aspirin, CINNAMON PO, Misc Natural Products (NARCOSOFT HERBAL LAX PO), Co Q-10, Vitamin D-3, Flax Seed Oil, vitamin E, b complex vitamins, Krill Oil, Red  Yeast Rice Extract (RED YEAST RICE PO), Magnesium, (Borage, Borago officinalis, (BORAGE OIL PO)), Tdap, RA B-COMPLEX/VITAMIN C CR, RA CALCIUM 600/VIT D/MINERALS, Indole-3-Carbinol, Nutritional Supplements (NUTRITIONAL SUPPLEMENT PO), and lisinopril-hydrochlorothiazide.  Meds ordered this encounter  Medications  . pantoprazole (PROTONIX) 40 MG tablet    Sig: Take 1 tablet (40 mg total) by mouth daily.    Dispense:  30 tablet    Refill:  3    There are no discontinued medications.  Follow-up: No Follow-up on file.   Crecencio Mc, MD

## 2015-12-17 NOTE — Progress Notes (Signed)
Pre-visit discussion using our clinic review tool. No additional management support is needed unless otherwise documented below in the visit note.  

## 2015-12-18 ENCOUNTER — Ambulatory Visit
Admission: RE | Admit: 2015-12-18 | Discharge: 2015-12-18 | Disposition: A | Discharge status: Home / Self Care | Payer: Commercial Managed Care - HMO | Source: Ambulatory Visit | Attending: Internal Medicine | Admitting: Internal Medicine

## 2015-12-18 DIAGNOSIS — R1013 Epigastric pain: Secondary | ICD-10-CM | POA: Diagnosis not present

## 2015-12-18 MED ORDER — PANTOPRAZOLE SODIUM 40 MG PO TBEC: 40.0000 mg | DELAYED_RELEASE_TABLET | Freq: Every day | ORAL | Status: DC

## 2015-12-18 NOTE — Assessment & Plan Note (Addendum)
She remains tender to palpation in the epigastric and RUQ on exam today .  Consider gastritis vs choledocholithiasis.  abd ultrasound ordered.  H pylori, lipase and alk phos/ALT/ALT are all normal.  Adding PPI for one month trial  .  Lab Results  Component Value Date   ALT 21 12/17/2015   AST 22 12/17/2015   ALKPHOS 72 12/17/2015   BILITOT 0.4 12/17/2015   .l

## 2015-12-18 NOTE — Assessment & Plan Note (Signed)
Managed with Red Yeast rice per patient preference despite discussion of 10 yr risk of CAD.  Low GI diet recommended and discussed for management of weight gain and  elevated triglycerides.   Lab Results  Component Value Date   CHOL 227* 11/22/2015   HDL 32.30* 11/22/2015   LDLDIRECT 148.0 11/22/2015   TRIG 283.0* 11/22/2015   CHOLHDL 7 11/22/2015    .

## 2015-12-20 ENCOUNTER — Telehealth: Payer: Self-pay

## 2015-12-20 NOTE — Telephone Encounter (Signed)
Ultrasound was normal.  No evidence of gallstones.  If pain reucres we wil need to do a HIDA scan (functional study to see if GB is emptying)

## 2015-12-20 NOTE — Telephone Encounter (Signed)
Patient will call if symptoms re-occur.

## 2015-12-20 NOTE — Telephone Encounter (Signed)
Wants to know the test results for recent ultrasound of abdomen  Done 12/18/14. Please advise

## 2016-02-27 ENCOUNTER — Encounter: Payer: Self-pay | Admitting: Internal Medicine

## 2016-02-27 ENCOUNTER — Ambulatory Visit (INDEPENDENT_AMBULATORY_CARE_PROVIDER_SITE_OTHER): Payer: Commercial Managed Care - HMO | Admitting: Internal Medicine

## 2016-02-27 VITALS — BP 114/64 | HR 75 | Temp 97.8°F | Resp 12 | Ht 66.0 in | Wt 171.2 lb

## 2016-02-27 DIAGNOSIS — D631 Anemia in chronic kidney disease: Secondary | ICD-10-CM

## 2016-02-27 DIAGNOSIS — Z1239 Encounter for other screening for malignant neoplasm of breast: Secondary | ICD-10-CM | POA: Diagnosis not present

## 2016-02-27 DIAGNOSIS — E119 Type 2 diabetes mellitus without complications: Secondary | ICD-10-CM

## 2016-02-27 DIAGNOSIS — I10 Essential (primary) hypertension: Secondary | ICD-10-CM | POA: Insufficient documentation

## 2016-02-27 DIAGNOSIS — N189 Chronic kidney disease, unspecified: Secondary | ICD-10-CM | POA: Diagnosis not present

## 2016-02-27 DIAGNOSIS — Z780 Asymptomatic menopausal state: Secondary | ICD-10-CM

## 2016-02-27 DIAGNOSIS — E785 Hyperlipidemia, unspecified: Secondary | ICD-10-CM | POA: Diagnosis not present

## 2016-02-27 DIAGNOSIS — Z853 Personal history of malignant neoplasm of breast: Secondary | ICD-10-CM

## 2016-02-27 LAB — CBC WITH DIFFERENTIAL/PLATELET
BASOS ABS: 0 10*3/uL (ref 0.0–0.1)
BASOS PCT: 0.4 % (ref 0.0–3.0)
Eosinophils Absolute: 0.2 10*3/uL (ref 0.0–0.7)
Eosinophils Relative: 1.5 % (ref 0.0–5.0)
HEMATOCRIT: 41.5 % (ref 36.0–46.0)
Hemoglobin: 14.1 g/dL (ref 12.0–15.0)
LYMPHS ABS: 6.6 10*3/uL — AB (ref 0.7–4.0)
LYMPHS PCT: 50.9 % — AB (ref 12.0–46.0)
MCHC: 34 g/dL (ref 30.0–36.0)
MCV: 86.8 fl (ref 78.0–100.0)
MONOS PCT: 6.3 % (ref 3.0–12.0)
Monocytes Absolute: 0.8 10*3/uL (ref 0.1–1.0)
NEUTROS ABS: 5.3 10*3/uL (ref 1.4–7.7)
NEUTROS PCT: 40.9 % — AB (ref 43.0–77.0)
PLATELETS: 307 10*3/uL (ref 150.0–400.0)
RBC: 4.78 Mil/uL (ref 3.87–5.11)
RDW: 13.6 % (ref 11.5–15.5)
WBC: 13 10*3/uL — ABNORMAL HIGH (ref 4.0–10.5)

## 2016-02-27 LAB — LIPID PANEL
Cholesterol: 266 mg/dL — ABNORMAL HIGH (ref 0–200)
HDL: 41.1 mg/dL (ref >39.00)
NONHDL: 224.44
Total CHOL/HDL Ratio: 6
Triglycerides: 224 mg/dL — ABNORMAL HIGH (ref 0.0–149.0)
VLDL: 44.8 mg/dL — ABNORMAL HIGH (ref 0.0–40.0)

## 2016-02-27 LAB — COMPREHENSIVE METABOLIC PANEL
ALT: 24 U/L (ref 0–35)
AST: 23 U/L (ref 0–37)
Albumin: 4.2 g/dL (ref 3.5–5.2)
Alkaline Phosphatase: 51 U/L (ref 39–117)
BILIRUBIN TOTAL: 0.4 mg/dL (ref 0.2–1.2)
BUN: 28 mg/dL — ABNORMAL HIGH (ref 6–23)
CHLORIDE: 105 meq/L (ref 96–112)
CO2: 28 meq/L (ref 19–32)
CREATININE: 1.06 mg/dL (ref 0.40–1.20)
Calcium: 9.9 mg/dL (ref 8.4–10.5)
GFR: 53.11 mL/min — AB (ref >60.00)
GLUCOSE: 118 mg/dL — AB (ref 70–99)
Potassium: 4.6 mEq/L (ref 3.5–5.1)
Sodium: 139 mEq/L (ref 135–145)
Total Protein: 7.4 g/dL (ref 6.0–8.3)

## 2016-02-27 LAB — LDL CHOLESTEROL, DIRECT: LDL DIRECT: 176 mg/dL

## 2016-02-27 NOTE — Progress Notes (Signed)
Subjective:  Patient ID: Haley Love, female    DOB: 1934/08/24  Age: 82 y.o. MRN: [id number]  CC: The primary encounter diagnosis was Hyperlipidemia with target LDL less than 100. Diagnoses of Anemia in chronic kidney disease, Essential hypertension, Breast cancer screening, Postmenopausal estrogen deficiency, History of breast cancer, and Diabetes mellitus type 2, diet-controlled ([place]) were also pertinent to this visit.  HPI Haley Love presents for FOLLOW UP om hypertension , GERD, and hyperlipidemia  No stomach issues,,  Stopped the protonix after a week  Not using red yeast rice currently . Using a natural supplement to lower cholesterol.    Patient is taking her medications as prescribed and notes no adverse effects.  Home BP readings have been done about once per week and are  generally < 130/80 .  She is avoiding added salt in her diet and walking regularly about 3 times per week for exercise  .   Outpatient Prescriptions Prior to Visit  Medication Sig Dispense Refill  . aspirin 81 MG tablet Take 81 mg by mouth daily.    Marland Kitchen b complex vitamins tablet Take 1 tablet by mouth daily.    . B Complex-C (RA B-COMPLEX/VITAMIN C CR) TBCR Take by mouth.    Chong Sicilian, Borago officinalis, (BORAGE OIL PO) Take 1,000 mg by mouth daily.    . Calcium Carbonate-Vit D-Min (RA CALCIUM 600/VIT D/MINERALS) 600-200 MG-UNIT TABS Take tablets by mouth once a day.    . Cholecalciferol (VITAMIN D-3) 1000 UNITS CAPS Take 1 capsule by mouth at bedtime.    Marland Kitchen CINNAMON PO Take 2,000 mg by mouth at bedtime.    . Coenzyme Q10 (CO Q-10) 200 MG CAPS Take 1 capsule by mouth at bedtime.    . Flaxseed, Linseed, (FLAX SEED OIL) 1000 MG CAPS Take by mouth.    . Indole-3-Carbinol POWD Take 200 mg by mouth 2 (two) times daily.    Javier Docker Oil 1500 MG CAPS Take by mouth.    Marland Kitchen lisinopril-hydrochlorothiazide (PRINZIDE,ZESTORETIC) 10-12.5 MG per tablet TAKE 1 TABLET BY MOUTH DAILY. 90 tablet 2  . Magnesium 500 MG CAPS  Take 1 capsule by mouth daily.     . Misc Natural Products (NARCOSOFT HERBAL LAX PO) Take 2 tablets by mouth at bedtime.     . Nutritional Supplements (NUTRITIONAL SUPPLEMENT PO) Take 3 capsules by mouth 2 (two) times daily. Neprinol AFD>    . Red Yeast Rice Extract (RED YEAST RICE PO) Take 1,000 mg by mouth 2 (two) times daily.    . TDaP (BOOSTRIX) 5-2.5-18.5 LF-MCG/0.5 injection Inject 0.5 mLs into the muscle once. 0.5 mL 0  . vitamin E 400 UNIT capsule Take 400 Units by mouth daily.    . pantoprazole (PROTONIX) 40 MG tablet Take 1 tablet (40 mg total) by mouth daily. (Patient not taking: Reported on 02/27/2016) 30 tablet 3   No facility-administered medications prior to visit.    Review of Systems;  Patient denies headache, fevers, malaise, unintentional weight loss, skin rash, eye pain, sinus congestion and sinus pain, sore throat, dysphagia,  hemoptysis , cough, dyspnea, wheezing, chest pain, palpitations, orthopnea, edema, abdominal pain, nausea, melena, diarrhea, constipation, flank pain, dysuria, hematuria, urinary  Frequency, nocturia, numbness, tingling, seizures,  Focal weakness, Loss of consciousness,  Tremor, insomnia, depression, anxiety, and suicidal ideation.      Objective:  BP 114/64 mmHg  Pulse 75  Temp(Src) 97.8 F (36.6 C) (Oral)  Resp 12  Ht 5\' 6"  (1.676 m)  Wt  171 lb 4 oz (77.678 kg)  BMI 27.65 kg/m2  SpO2 96%  BP Readings from Last 3 Encounters:  02/27/16 114/64  12/17/15 140/82  08/29/15 140/72    Wt Readings from Last 3 Encounters:  02/27/16 171 lb 4 oz (77.678 kg)  12/17/15 173 lb 6 oz (78.642 kg)  08/29/15 172 lb (78.019 kg)    General appearance: alert, cooperative and appears stated age Ears: normal TM's and external ear canals both ears Throat: lips, mucosa, and tongue normal; teeth and gums normal Neck: no adenopathy, no carotid bruit, supple, symmetrical, trachea midline and thyroid not enlarged, symmetric, no tenderness/mass/nodules Back:  symmetric, no curvature. ROM normal. No CVA tenderness. Lungs: clear to auscultation bilaterally Heart: regular rate and rhythm, S1, S2 normal, no murmur, click, rub or gallop Abdomen: soft, non-tender; bowel sounds normal; no masses,  no organomegaly Pulses: 2+ and symmetric Skin: Skin color, texture, turgor normal. No rashes or lesions Lymph nodes: Cervical, supraclavicular, and axillary nodes normal.  Lab Results  Component Value Date   HGBA1C 6.5 11/22/2015   HGBA1C 6.3 08/10/2014   HGBA1C 6.3 12/30/2012    Lab Results  Component Value Date   CREATININE 1.06 02/27/2016   CREATININE 1.02 12/17/2015   CREATININE 1.15 11/22/2015    Lab Results  Component Value Date   WBC 13.0* 02/27/2016   HGB 14.1 02/27/2016   HCT 41.5 02/27/2016   PLT 307.0 02/27/2016   GLUCOSE 118* 02/27/2016   CHOL 266* 02/27/2016   TRIG 224.0* 02/27/2016   HDL 41.10 02/27/2016   LDLDIRECT 176.0 02/27/2016   ALT 24 02/27/2016   AST 23 02/27/2016   NA 139 02/27/2016   K 4.6 02/27/2016   CL 105 02/27/2016   CREATININE 1.06 02/27/2016   BUN 28* 02/27/2016   CO2 28 02/27/2016   TSH 1.37 11/22/2015   INR 1.0 05/31/2014   HGBA1C 6.5 11/22/2015    US Abdomen Complete  12/18/2015  CLINICAL DATA:  Postprandial right upper quadrant right-sided back and epigastric pain for the past 10 days, no nausea or vomiting EXAM: ABDOMEN ULTRASOUND COMPLETE COMPARISON:  Abdominal ultrasound dated October 28, 2013. FINDINGS: Gallbladder: No gallstones or wall thickening visualized. No sonographic Murphy sign noted by sonographer. Common bile duct: Diameter: 4.8 mm Liver: No focal lesion identified. Within normal limits in parenchymal echogenicity. IVC: No abnormality visualized. Pancreas: Visualized portion unremarkable. Spleen: Size and appearance within normal limits. Right Kidney: Length: 9.5 cm. Echogenicity within normal limits. No mass or hydronephrosis visualized. Left Kidney: Length: 10 cm. Echogenicity within  normal limits. No mass or hydronephrosis visualized. Abdominal aorta: No aneurysm visualized. Other findings: None. IMPRESSION: No acute intra-abdominal abnormality is demonstrated. If there are clinical concerns of gallbladder dysfunction, a nuclear medicine hepatobiliary scan may be useful. Electronically Signed   By: David  Martinique M.D.   On: 12/18/2015 12:11    Assessment & Plan:   Problem List Items Addressed This Visit    Hyperlipidemia with target LDL less than 100 - Primary    She has deferred treatment even with red yeast rice.  Lab Results  Component Value Date   CHOL 266* 02/27/2016   HDL 41.10 02/27/2016   LDLDIRECT 176.0 02/27/2016   TRIG 224.0* 02/27/2016   CHOLHDL 6 02/27/2016         Relevant Orders   Lipid panel (Completed)   Anemia in chronic kidney disease     Repeat hgb  is normal but persistent mild leukocytosis again noted.   Lab Results  Component  Value Date   WBC 13.0* 02/27/2016   HGB 14.1 02/27/2016   HCT 41.5 02/27/2016   MCV 86.8 02/27/2016   PLT 307.0 02/27/2016             Relevant Orders   CBC with Differential/Platelet (Completed)   Essential hypertension    Well controlled on current regimen. Renal function stable, no changes today.  Lab Results  Component Value Date   CREATININE 1.06 02/27/2016   Lab Results  Component Value Date   NA 139 02/27/2016   K 4.6 02/27/2016   CL 105 02/27/2016   CO2 28 02/27/2016         Relevant Orders   Comprehensive metabolic panel (Completed)   Diabetes mellitus type 2, diet-controlled ([place])    New diagnosis with a1c 6.5  In December .  I have addressed   recommended a low glycemic index diet utilizing smaller more frequent meals to increase metabolism.  I have also recommended that patient start exercising with a goal of 30 minutes of aerobic exercise a minimum of 5 days per week. Screening for lipid disorders, thyroid and diabetes to be done today.         Other Visit Diagnoses     Breast cancer screening        Postmenopausal estrogen deficiency        Relevant Orders    DG Bone Density    History of breast cancer        Relevant Orders    MM Digital Diagnostic Bilat     A total of 25 minutes of face to face time was spent with patient more than half of which was spent in counselling about the above mentioned conditions  and coordination of care   I have discontinued Ms. Olivencia's pantoprazole. I am also having her maintain her aspirin, CINNAMON PO, Misc Natural Products (NARCOSOFT HERBAL LAX PO), Co Q-10, Vitamin D-3, Flax Seed Oil, vitamin E, b complex vitamins, Krill Oil, Red Yeast Rice Extract (RED YEAST RICE PO), Magnesium, (Borage, Borago officinalis, (BORAGE OIL PO)), Tdap, RA B-COMPLEX/VITAMIN C CR, RA CALCIUM 600/VIT D/MINERALS, Indole-3-Carbinol, Nutritional Supplements (NUTRITIONAL SUPPLEMENT PO), and lisinopril-hydrochlorothiazide.  No orders of the defined types were placed in this encounter.    Medications Discontinued During This Encounter  Medication Reason  . pantoprazole (PROTONIX) 40 MG tablet     Follow-up: No Follow-up on file.   Crecencio Mc, MD

## 2016-02-27 NOTE — Patient Instructions (Addendum)
Your blood pressure is perfect!  No changes are  Needed unless your labs today indicate a problem with your electrolytes   You might want to try a premixed protein drink called Premier Protein shake in the morning.  It is less $$$ and very low sugar.    160 cal  30 g protein  1 g sugar 50% calcium needs   You are due for your annual mammogram and a bone density test  this summer

## 2016-02-27 NOTE — Progress Notes (Signed)
Pre-visit discussion using our clinic review tool. No additional management support is needed unless otherwise documented below in the visit note.  

## 2016-02-29 ENCOUNTER — Encounter: Payer: Self-pay | Admitting: *Deleted

## 2016-03-01 DIAGNOSIS — E119 Type 2 diabetes mellitus without complications: Secondary | ICD-10-CM | POA: Insufficient documentation

## 2016-03-01 NOTE — Assessment & Plan Note (Signed)
She has deferred treatment even with red yeast rice.  Lab Results  Component Value Date   CHOL 266* 02/27/2016   HDL 41.10 02/27/2016   LDLDIRECT 176.0 02/27/2016   TRIG 224.0* 02/27/2016   CHOLHDL 6 02/27/2016

## 2016-03-01 NOTE — Assessment & Plan Note (Signed)
Well controlled on current regimen. Renal function stable, no changes today.  Lab Results  Component Value Date   CREATININE 1.06 02/27/2016   Lab Results  Component Value Date   NA 139 02/27/2016   K 4.6 02/27/2016   CL 105 02/27/2016   CO2 28 02/27/2016

## 2016-03-01 NOTE — Assessment & Plan Note (Addendum)
New diagnosis with a1c 6.5  In December .  I have addressed   recommended a low glycemic index diet utilizing smaller more frequent meals to increase metabolism.  I have also recommended that patient start exercising with a goal of 30 minutes of aerobic exercise a minimum of 5 days per week. Screening for lipid disorders, thyroid and diabetes to be done today.   Lab Results  Component Value Date   HGBA1C 6.5 11/22/2015   No results found for: Derl Barrow

## 2016-03-01 NOTE — Assessment & Plan Note (Addendum)
Repeat hgb  is normal but persistent mild leukocytosis again noted.   Lab Results  Component Value Date   WBC 13.0* 02/27/2016   HGB 14.1 02/27/2016   HCT 41.5 02/27/2016   MCV 86.8 02/27/2016   PLT 307.0 02/27/2016

## 2016-03-10 ENCOUNTER — Other Ambulatory Visit: Payer: Self-pay | Admitting: Internal Medicine

## 2016-03-10 DIAGNOSIS — E2839 Other primary ovarian failure: Secondary | ICD-10-CM

## 2016-03-29 ENCOUNTER — Other Ambulatory Visit: Payer: Self-pay | Admitting: Internal Medicine

## 2016-04-01 ENCOUNTER — Other Ambulatory Visit: Payer: Self-pay | Admitting: Nurse Practitioner

## 2016-04-02 DIAGNOSIS — Z08 Encounter for follow-up examination after completed treatment for malignant neoplasm: Secondary | ICD-10-CM | POA: Diagnosis not present

## 2016-04-02 DIAGNOSIS — D485 Neoplasm of uncertain behavior of skin: Secondary | ICD-10-CM | POA: Diagnosis not present

## 2016-04-02 DIAGNOSIS — Z85828 Personal history of other malignant neoplasm of skin: Secondary | ICD-10-CM | POA: Diagnosis not present

## 2016-04-02 DIAGNOSIS — L570 Actinic keratosis: Secondary | ICD-10-CM | POA: Diagnosis not present

## 2016-04-23 DIAGNOSIS — L821 Other seborrheic keratosis: Secondary | ICD-10-CM | POA: Diagnosis not present

## 2016-04-23 DIAGNOSIS — L570 Actinic keratosis: Secondary | ICD-10-CM | POA: Diagnosis not present

## 2016-04-23 DIAGNOSIS — X32XXXA Exposure to sunlight, initial encounter: Secondary | ICD-10-CM | POA: Diagnosis not present

## 2016-07-23 ENCOUNTER — Ambulatory Visit
Admission: RE | Admit: 2016-07-23 | Discharge: 2016-07-23 | Disposition: A | Discharge status: Home / Self Care | Payer: Commercial Managed Care - HMO | Source: Ambulatory Visit | Attending: Internal Medicine | Admitting: Internal Medicine

## 2016-07-23 DIAGNOSIS — Z780 Asymptomatic menopausal state: Secondary | ICD-10-CM | POA: Diagnosis not present

## 2016-07-23 DIAGNOSIS — E2839 Other primary ovarian failure: Secondary | ICD-10-CM

## 2016-07-23 DIAGNOSIS — R928 Other abnormal and inconclusive findings on diagnostic imaging of breast: Secondary | ICD-10-CM | POA: Diagnosis not present

## 2016-07-23 DIAGNOSIS — Z853 Personal history of malignant neoplasm of breast: Secondary | ICD-10-CM

## 2016-07-23 DIAGNOSIS — Z13820 Encounter for screening for osteoporosis: Secondary | ICD-10-CM | POA: Diagnosis not present

## 2016-07-25 ENCOUNTER — Encounter: Payer: Self-pay | Admitting: *Deleted

## 2016-07-25 ENCOUNTER — Other Ambulatory Visit: Payer: Self-pay | Admitting: Adult Health

## 2016-07-25 DIAGNOSIS — D472 Monoclonal gammopathy: Secondary | ICD-10-CM

## 2016-07-25 DIAGNOSIS — C50512 Malignant neoplasm of lower-outer quadrant of left female breast: Secondary | ICD-10-CM

## 2016-07-29 ENCOUNTER — Other Ambulatory Visit (HOSPITAL_BASED_OUTPATIENT_CLINIC_OR_DEPARTMENT_OTHER): Payer: Commercial Managed Care - HMO

## 2016-07-29 DIAGNOSIS — C50812 Malignant neoplasm of overlapping sites of left female breast: Secondary | ICD-10-CM | POA: Diagnosis not present

## 2016-07-29 DIAGNOSIS — D472 Monoclonal gammopathy: Secondary | ICD-10-CM

## 2016-07-29 DIAGNOSIS — C50512 Malignant neoplasm of lower-outer quadrant of left female breast: Secondary | ICD-10-CM

## 2016-07-29 LAB — CBC WITH DIFFERENTIAL/PLATELET
BASO%: 0.4 % (ref 0.0–2.0)
Basophils Absolute: 0 10*3/uL (ref 0.0–0.1)
EOS ABS: 0.2 10*3/uL (ref 0.0–0.5)
EOS%: 1.8 % (ref 0.0–7.0)
HEMATOCRIT: 43 % (ref 34.8–46.6)
HGB: 14.7 g/dL (ref 11.6–15.9)
LYMPH%: 55.4 % — AB (ref 14.0–49.7)
MCH: 30.1 pg (ref 25.1–34.0)
MCHC: 34.2 g/dL (ref 31.5–36.0)
MCV: 87.9 fL (ref 79.5–101.0)
MONO#: 0.7 10*3/uL (ref 0.1–0.9)
MONO%: 6.1 % (ref 0.0–14.0)
NEUT#: 4.1 10*3/uL (ref 1.5–6.5)
NEUT%: 36.3 % — AB (ref 38.4–76.8)
PLATELETS: 219 10*3/uL (ref 145–400)
RBC: 4.89 10*6/uL (ref 3.70–5.45)
RDW: 12.9 % (ref 11.2–14.5)
WBC: 11.4 10*3/uL — ABNORMAL HIGH (ref 3.9–10.3)
lymph#: 6.3 10*3/uL — ABNORMAL HIGH (ref 0.9–3.3)
nRBC: 0 % (ref 0–0)

## 2016-07-29 LAB — COMPREHENSIVE METABOLIC PANEL
ALT: 28 U/L (ref 0–55)
ANION GAP: 10 meq/L (ref 3–11)
AST: 25 U/L (ref 5–34)
Albumin: 3.9 g/dL (ref 3.5–5.0)
Alkaline Phosphatase: 50 U/L (ref 40–150)
BUN: 22.2 mg/dL (ref 7.0–26.0)
CALCIUM: 9.8 mg/dL (ref 8.4–10.4)
CHLORIDE: 105 meq/L (ref 98–109)
CO2: 25 meq/L (ref 22–29)
Creatinine: 0.9 mg/dL (ref 0.6–1.1)
EGFR: 63 mL/min/{1.73_m2} — AB (ref >90)
Glucose: 112 mg/dl (ref 70–140)
POTASSIUM: 4.2 meq/L (ref 3.5–5.1)
Sodium: 141 mEq/L (ref 136–145)
Total Bilirubin: 0.41 mg/dL (ref 0.20–1.20)
Total Protein: 7.5 g/dL (ref 6.4–8.3)

## 2016-07-29 LAB — TECHNOLOGIST REVIEW

## 2016-07-30 LAB — PROTEIN ELECTROPHORESIS, SERUM
A/G RATIO SPE: 1.3 (ref 0.7–1.7)
ALBUMIN: 4 g/dL (ref 2.9–4.4)
Alpha 1: 0.1 g/dL (ref 0.0–0.4)
Alpha 2: 0.9 g/dL (ref 0.4–1.0)
Beta: 1.2 g/dL (ref 0.7–1.3)
Gamma Globulin: 0.9 g/dL (ref 0.4–1.8)
Globulin, Total: 3.1 g/dL (ref 2.2–3.9)
M-Spike, %: 0.4 g/dL — ABNORMAL HIGH
TOTAL PROTEIN: 7.1 g/dL (ref 6.0–8.5)

## 2016-07-30 LAB — KAPPA/LAMBDA LIGHT CHAINS
IG KAPPA FREE LIGHT CHAIN: 16.1 mg/L (ref 3.3–19.4)
Ig Lambda Free Light Chain: 13.3 mg/L (ref 5.7–26.3)
KAPPA/LAMBDA FLC RATIO: 1.21 (ref 0.26–1.65)

## 2016-08-05 ENCOUNTER — Ambulatory Visit: Payer: Commercial Managed Care - HMO | Admitting: Nurse Practitioner

## 2016-08-06 ENCOUNTER — Telehealth: Payer: Self-pay | Admitting: Adult Health

## 2016-08-06 ENCOUNTER — Ambulatory Visit (HOSPITAL_BASED_OUTPATIENT_CLINIC_OR_DEPARTMENT_OTHER): Payer: Commercial Managed Care - HMO | Admitting: Adult Health

## 2016-08-06 VITALS — BP 136/61 | HR 87 | Temp 98.4°F | Resp 18 | Ht 66.0 in | Wt 168.4 lb

## 2016-08-06 DIAGNOSIS — Z853 Personal history of malignant neoplasm of breast: Secondary | ICD-10-CM

## 2016-08-06 DIAGNOSIS — D472 Monoclonal gammopathy: Secondary | ICD-10-CM | POA: Diagnosis not present

## 2016-08-06 DIAGNOSIS — C50512 Malignant neoplasm of lower-outer quadrant of left female breast: Secondary | ICD-10-CM

## 2016-08-06 NOTE — Telephone Encounter (Signed)
appt made and avs printed °

## 2016-08-07 ENCOUNTER — Encounter: Payer: Self-pay | Admitting: Adult Health

## 2016-08-07 NOTE — Progress Notes (Signed)
CLINIC:  Survivorship   REASON FOR VISIT:  Routine follow-up for history of breast cancer.   BRIEF ONCOLOGIC HISTORY:  (from Dr. Virgie Dad last visit on 07/31/15)   INTERVAL HISTORY:  Haley Love presents to the Survivorship Clinic today for routine follow-up for her history of breast cancer.  Overall, she reports feeling quite well. She stopped the red yeast rice recently for her cholesterol because of back pain (she prefers to take herbal supplements rather than prescription medications).  The back pain resolved after stopping the red yeast rice.  She is planning on trying to resume it sometime soon "because I know I need to do something about my cholesterol."  She sees her PCP regularly.  Otherwise, she is largely with no complaints today.    Unrelated to her breast cancer, she did tell me that the past year has been very stressful for her.  She shared with me some history that she and her ex-husband were married for 35+ years and he left her for a [place] woman about 20 years ago.  For the past 5 years, her ex-husband and his new wife have been living in the [place].  He returned to the [place] in May to gather some money and reportedly died suddenly while here in [place]; her son found him dead in his home. This happened in 03/2016 and she is still grieving this situation. In addition, her nephew recently died from a likely drug overdose.  Also, her granddaughter has recently sought treatment for heroin addiction and has moved to [place], [place] where she lives in a halfway house/sober living home.  Understandably, all of these things have been very stressful for her over the past year, but she states that she is "hanging in there."    REVIEW OF SYSTEMS:  Review of Systems  Constitutional: Negative.   HENT: Negative.   Eyes: Negative.   Respiratory: Negative.   Cardiovascular: Negative.   Gastrointestinal: Negative.   Genitourinary: Negative.   Musculoskeletal: Negative.   Skin: Negative.    Neurological: Negative.   Endo/Heme/Allergies: Negative.   Psychiatric/Behavioral: Negative.   GU: Denies vaginal bleeding, discharge, or dryness.  Breast: Denies any new nodularity, masses, tenderness, nipple changes, or nipple discharge.    A 14-point review of systems was completed and was negative, except as noted above.    PAST MEDICAL/SURGICAL HISTORY:  Past Medical History:  Diagnosis Date  . Breast cancer ([place])   . Cataract   . Contact lens/glasses fitting   . Hyperlipidemia   . Hypertension   . Inflammatory polyps of colon [facility])    Past Surgical History:  Procedure Laterality Date  . BREAST SURGERY  9/13   lt lump  . cataracts  2014  . JOINT REPLACEMENT Right July 2015   [place]en, right knee  . KNEE ARTHROSCOPY W/ MENISCAL REPAIR  09/23/11   Right  . MASTECTOMY, PARTIAL  08/06/2012   Procedure: MASTECTOMY PARTIAL;  Surgeon: Adin Hector, MD;  Location: [facility];  Service: General;  Laterality: Left;  Left partial mastectomy with re-excision of margins     ALLERGIES:  Allergies  Allergen Reactions  . No Known Allergies      CURRENT MEDICATIONS:  Outpatient Encounter Prescriptions as of 08/06/2016  Medication Sig Note  . aspirin 81 MG tablet Take 81 mg by mouth daily.   Marland Kitchen b complex vitamins tablet Take 1 tablet by mouth daily.   . B Complex-C (RA B-COMPLEX/VITAMIN C CR) TBCR Take by mouth. 07/25/2014:  Received from: [facility]  . Borage, Borago officinalis, (BORAGE OIL PO) Take 1,000 mg by mouth daily.   . Calcium Carbonate-Vit D-Min (RA CALCIUM 600/VIT D/MINERALS) 600-200 MG-UNIT TABS Take tablets by mouth once a day. 07/25/2014: Received from: [facility]  . Cholecalciferol (VITAMIN D-3) 1000 UNITS CAPS Take 1 capsule by mouth at bedtime.   Marland Kitchen CINNAMON PO Take 2,000 mg by mouth at bedtime.   . Coenzyme Q10 (CO Q-10) 200 MG CAPS Take 1 capsule by mouth at bedtime.   . Flaxseed, Linseed, (FLAX SEED OIL)  1000 MG CAPS Take by mouth.   . Indole-3-Carbinol POWD Take 200 mg by mouth 2 (two) times daily.   Javier Docker Oil 1500 MG CAPS Take by mouth.   Marland Kitchen lisinopril-hydrochlorothiazide (PRINZIDE,ZESTORETIC) 10-12.5 MG tablet TAKE 1 TABLET BY MOUTH DAILY.   . Magnesium 500 MG CAPS Take 1 capsule by mouth daily.    . Misc Natural Products (NARCOSOFT HERBAL LAX PO) Take 2 tablets by mouth at bedtime.    . Nutritional Supplements (NUTRITIONAL SUPPLEMENT PO) Take 3 capsules by mouth 2 (two) times daily. Neprinol AFD>   . vitamin E 400 UNIT capsule Take 400 Units by mouth daily.   . Red Yeast Rice Extract (RED YEAST RICE PO) Take 1,000 mg by mouth 2 (two) times daily.   . TDaP (BOOSTRIX) 5-2.5-18.5 LF-MCG/0.5 injection Inject 0.5 mLs into the muscle once. (Patient not taking: Reported on 08/06/2016)    No facility-administered encounter medications on file as of 08/06/2016.      ONCOLOGIC FAMILY HISTORY:  Family History  Problem Relation Age of Onset  . Lung cancer Mother   . Arthritis Mother   . Hyperlipidemia Mother   . Hypertension Mother   . Throat cancer Father   . Hyperlipidemia Father   . Cancer Paternal Aunt     breast    GENETIC COUNSELING/TESTING: No records available for review.   SOCIAL HISTORY:  Haley Love is divorced and lives alone in [place], [place]. She has 2 children, 1 son and 1 daughter.  She has 3 grandchildren.  Ms. 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 She denies any current or history of tobacco or illicit drug use.  She drinks alcohol occasionally.    PHYSICAL EXAMINATION:  Vital Signs: Vitals:   08/06/16 1124  BP: 136/61  Pulse: 87  Resp: 18  Temp: 98.4 F (36.9 C)   Filed Weights   08/06/16 1124  Weight: 168 lb 6.4 oz (76.4 kg)   General: Well-nourished, well-appearing female in no acute distress.  She is unaccompanied today.  HEENT: Head is normocephalic.  Pupils equal and reactive to light. Conjunctivae clear  without exudate.  Sclerae anicteric. Oral mucosa is pink, moist.  Oropharynx is pink without lesions or erythema.  Lymph: No cervical, supraclavicular, or infraclavicular lymphadenopathy noted on palpation.  Cardiovascular: Regular rate and rhythm.Marland Kitchen Respiratory: Clear to auscultation bilaterally. Chest expansion symmetric; breathing non-labored.  Breast Exam:  -Left breast: No appreciable masses on palpation. No skin redness, thickening, or peau d'orange appearance; no nipple retraction or nipple discharge; mild distortion in symmetry at previous lumpectomy site; healed scar without erythema or nodularity.  -Right breast: No appreciable masses on palpation. No skin redness, thickening, or peau d'orange appearance; no nipple retraction or nipple discharge. -Axilla: No axillary adenopathy bilaterally.  GI: Abdomen soft and round; non-tender, non-distended. Bowel sounds normoactive. No hepatosplenomegaly.   GU: Deferred.  Neuro: No focal deficits. Steady gait.  Psych: Mood and affect normal and appropriate for situation.  Extremities: No edema. Skin: Warm and dry.  LABORATORY DATA:  CBC    Component Value Date/Time   WBC 11.4 (H) 07/29/2016 1056   WBC 13.0 (H) 02/27/2016 1045   RBC 4.89 07/29/2016 1056   RBC 4.78 02/27/2016 1045   HGB 14.7 07/29/2016 1056   HCT 43.0 07/29/2016 1056   PLT 219 07/29/2016 1056   MCV 87.9 07/29/2016 1056   MCH 30.1 07/29/2016 1056   MCHC 34.2 07/29/2016 1056   MCHC 34.0 02/27/2016 1045   RDW 12.9 07/29/2016 1056   LYMPHSABS 6.3 (H) 07/29/2016 1056   MONOABS 0.7 07/29/2016 1056   EOSABS 0.2 07/29/2016 1056   BASOSABS 0.0 07/29/2016 1056   CMP Latest Ref Rng & Units 07/29/2016 07/29/2016 02/27/2016  Glucose 70 - 140 mg/dl 112 - 118(H)  BUN 7.0 - 26.0 mg/dL 22.2 - 28(H)  Creatinine 0.6 - 1.1 mg/dL 0.9 - 1.06  Sodium 136 - 145 mEq/L 141 - 139  Potassium 3.5 - 5.1 mEq/L 4.2 - 4.6  Chloride 96 - 112 mEq/L - - 105  CO2 22 - 29 mEq/L 25 - 28  Calcium 8.4 -  10.4 mg/dL 9.8 - 9.9  Total Protein 6.0 - 8.5 g/dL 7.5 7.1 7.4  Total Bilirubin 0.20 - 1.20 mg/dL 0.41 - 0.4  Alkaline Phos 40 - 150 U/L 50 - 51  AST 5 - 34 U/L 25 - 23  ALT 0 - 55 U/L 28 - 24   Results for LAKEVIA, PERRIS (MRN [id number])  Ref. Range 07/29/2016 10:56  Ig Kappa Free Light Chain Latest Ref Range: 3.3 - 19.4 mg/L 16.1  Ig Lambda Free Light Chain Latest Ref Range: 5.7 - 26.3 mg/L 13.3  Kappa/Lambda FluidC Ratio Latest Ref Range: 0.26 - 1.65  1.21   Results for COY, VANDOREN (MRN [id number])   Ref. Range 07/29/2016 10:56  Alpha 1 Latest Ref Range: 0.0 - 0.4 g/dL 0.1  Alpha 2 Latest Ref Range: 0.4 - 1.0 g/dL 0.9  Beta Latest Ref Range: 0.7 - 1.3 g/dL 1.2  Globulin, Total Latest Ref Range: 2.2 - 3.9 g/dL 3.1  A/G Ratio Latest Ref Range: 0.7 - 1.7  1.3  Gamma Globulin Latest Ref Range: 0.4 - 1.8 g/dL 0.9  M-SPIKE, % Latest Ref Range: Not Observed g/dL 0.4 (H)  Please Note: Unknown Comment    DIAGNOSTIC IMAGING:  Most recent mammogram: 07/23/16      ASSESSMENT AND PLAN:  Ms.. Mosteller is a pleasant 82 y.o. female with history of Stage IA left breast invasive ductal carcinoma, ER+/PR+/HER2-, diagnosed in 07/2012, treated with lumpectomy. She did not require adjuvant radiation therapy. Attempted anti-estrogen therapy with tamoxifen and anastrozole, but was unable to tolerate either. She is now on observation alone.  She presents to the Survivorship Clinic for surveillance and routine follow-up.   1. History of Stage IA left breast cancer:  Ms. Heindl is currently clinically and radiographically without evidence of disease or recurrence of breast cancer. She will follow-up with her medical oncologist, Dr. [name] in 1 year.  Her next mammogram will be due in 06/2017; orders placed today.  Next year will be her 5-Year visit since her diagnosis of breast cancer.  We discussed that she may elect to "graduate" from follow-up here at the cancer center. I also offered that I would be  happy to continue to see her here annually in the [facility], if  she chooses.  She is going to think about it and let Dr. [name] know at her visit next year, which is completely reasonable.  I encouraged her to call me with any questions or concerns before her next appointment here at the cancer center.  I would be happy to see her sooner, if needed.    2. Monoclonal gammopathy of undetermined significance, stable: Her SPEP lab studies are largely stable when compared to last year.  Her M-spike is 0.4.  The kappa/lambda ratio is normal. Her calcium and creatinine are both normal.  She has no bone pain.  I reiterated that her protein studies are stable and we can continue with observation alone.  She does not need treatment at this time.  She will follow-up next year with Dr. [name] with repeat SPEP, kappa/lambda light chain studies for further evaluation.    3. Emotional support: Ms. Taketa has understandably had a very stressful year based on the events that have occurred in her life. I provided emotional support today with active listening and expressive supportive counseling.  She is coping well and has good support in her family.  She knows to let us know if there is anything we can do to continue to help support her during this time.  She voiced appreciation.    4. Bone health:  Given Ms. Brittle's age and history of breast cancer, she is at risk for bone demineralization.  Her last DEXA scan was on 07/23/16 and was normal. Given that she is not taking any anti-estrogen therapy at present, I will defer any future imaging to her PCP or medical oncologist, as clinically indicated.  In the meantime, she was encouraged to increase her consumption of foods rich in calcium, as well as increase her weight-bearing activities.  She was given education on specific food and activities to promote bone health.  5. Health maintenance and wellness promotion: Ms. Nawabi was encouraged to consume 5-7  servings of fruits and vegetables per day. She was also encouraged to engage in moderate to vigorous exercise for 30 minutes per day most days of the week. She was instructed to limit her alcohol consumption and continue to abstain from tobacco use.    Dispo:  -Annual mammogram due in 06/2017; orders placed today.  -Return to cancer center to see Dr. [name] in 07/2017   A total of 30 minutes of face-to-face time was spent with this patient with greater than 50% of that time in counseling and care-coordination.   Mike Craze, NP Survivorship Program [facility] [phone]   Note: PRIMARY CARE PROVIDER Crecencio Mc, [place] [phone]

## 2016-08-11 ENCOUNTER — Other Ambulatory Visit: Payer: Self-pay | Admitting: Internal Medicine

## 2016-08-11 ENCOUNTER — Telehealth: Payer: Self-pay | Admitting: Internal Medicine

## 2016-08-11 DIAGNOSIS — K635 Polyp of colon: Secondary | ICD-10-CM

## 2016-08-11 NOTE — Telephone Encounter (Signed)
Looks like last colonoscopy order was 09/2016. Is it time for her to have another? If so please place referral.

## 2016-08-11 NOTE — Telephone Encounter (Signed)
Pt called wanting to get a referral to get a colonoscopy done. Please advise? Pt also wants to know if she's due?  Call pt @ [phone]. Thank you!

## 2016-08-11 NOTE — Telephone Encounter (Signed)
Yes, she is due for 5 year follow up, GI referral made.

## 2016-08-12 ENCOUNTER — Encounter: Payer: Self-pay | Admitting: Internal Medicine

## 2016-08-12 NOTE — Telephone Encounter (Signed)
LMTRC

## 2016-09-16 DIAGNOSIS — H26493 Other secondary cataract, bilateral: Secondary | ICD-10-CM | POA: Diagnosis not present

## 2016-10-10 DIAGNOSIS — Z86010 Personal history of colonic polyps: Secondary | ICD-10-CM | POA: Diagnosis not present

## 2016-12-30 ENCOUNTER — Telehealth: Payer: Self-pay | Admitting: Internal Medicine

## 2016-12-30 NOTE — Telephone Encounter (Signed)
I called pt and left a vm to call the office to sch AWV. Thank you! °

## 2017-01-05 ENCOUNTER — Encounter: Payer: Self-pay | Admitting: *Deleted

## 2017-01-05 ENCOUNTER — Encounter
Admission: RE | Disposition: A | Discharge status: Home / Self Care | Payer: Self-pay | Source: Ambulatory Visit | Attending: Unknown Physician Specialty

## 2017-01-05 ENCOUNTER — Ambulatory Visit: Payer: Medicare HMO | Admitting: Anesthesiology

## 2017-01-05 ENCOUNTER — Ambulatory Visit
Admission: RE | Admit: 2017-01-05 | Discharge: 2017-01-05 | Disposition: A | Discharge status: Home / Self Care | Payer: Medicare HMO | Source: Ambulatory Visit | Attending: Unknown Physician Specialty | Admitting: Unknown Physician Specialty

## 2017-01-05 DIAGNOSIS — Z86010 Personal history of colonic polyps: Secondary | ICD-10-CM | POA: Diagnosis not present

## 2017-01-05 DIAGNOSIS — E119 Type 2 diabetes mellitus without complications: Secondary | ICD-10-CM | POA: Insufficient documentation

## 2017-01-05 DIAGNOSIS — Z800 Family history of malignant neoplasm of digestive organs: Secondary | ICD-10-CM | POA: Diagnosis not present

## 2017-01-05 DIAGNOSIS — Z803 Family history of malignant neoplasm of breast: Secondary | ICD-10-CM | POA: Diagnosis not present

## 2017-01-05 DIAGNOSIS — Z79899 Other long term (current) drug therapy: Secondary | ICD-10-CM | POA: Insufficient documentation

## 2017-01-05 DIAGNOSIS — D123 Benign neoplasm of transverse colon: Secondary | ICD-10-CM | POA: Insufficient documentation

## 2017-01-05 DIAGNOSIS — Z1211 Encounter for screening for malignant neoplasm of colon: Secondary | ICD-10-CM | POA: Diagnosis not present

## 2017-01-05 DIAGNOSIS — K640 First degree hemorrhoids: Secondary | ICD-10-CM | POA: Diagnosis not present

## 2017-01-05 DIAGNOSIS — Z8261 Family history of arthritis: Secondary | ICD-10-CM | POA: Insufficient documentation

## 2017-01-05 DIAGNOSIS — Z7982 Long term (current) use of aspirin: Secondary | ICD-10-CM | POA: Insufficient documentation

## 2017-01-05 DIAGNOSIS — Z96651 Presence of right artificial knee joint: Secondary | ICD-10-CM | POA: Insufficient documentation

## 2017-01-05 DIAGNOSIS — Z801 Family history of malignant neoplasm of trachea, bronchus and lung: Secondary | ICD-10-CM | POA: Insufficient documentation

## 2017-01-05 DIAGNOSIS — E785 Hyperlipidemia, unspecified: Secondary | ICD-10-CM | POA: Diagnosis not present

## 2017-01-05 DIAGNOSIS — I10 Essential (primary) hypertension: Secondary | ICD-10-CM | POA: Insufficient documentation

## 2017-01-05 DIAGNOSIS — K648 Other hemorrhoids: Secondary | ICD-10-CM | POA: Diagnosis not present

## 2017-01-05 DIAGNOSIS — K635 Polyp of colon: Secondary | ICD-10-CM | POA: Diagnosis not present

## 2017-01-05 DIAGNOSIS — D120 Benign neoplasm of cecum: Secondary | ICD-10-CM | POA: Insufficient documentation

## 2017-01-05 DIAGNOSIS — Z8249 Family history of ischemic heart disease and other diseases of the circulatory system: Secondary | ICD-10-CM | POA: Insufficient documentation

## 2017-01-05 DIAGNOSIS — D122 Benign neoplasm of ascending colon: Secondary | ICD-10-CM | POA: Insufficient documentation

## 2017-01-05 DIAGNOSIS — Z853 Personal history of malignant neoplasm of breast: Secondary | ICD-10-CM | POA: Insufficient documentation

## 2017-01-05 HISTORY — DX: Varicella without complication: B01.9

## 2017-01-05 HISTORY — DX: Type 2 diabetes mellitus without complications: E11.9

## 2017-01-05 HISTORY — PX: COLONOSCOPY WITH PROPOFOL: SHX5780

## 2017-01-05 SURGERY — COLONOSCOPY WITH PROPOFOL
Anesthesia: General

## 2017-01-05 MED ORDER — PROPOFOL 10 MG/ML IV BOLUS
INTRAVENOUS | Status: DC | PRN
  Administered 2017-01-05: 100 mg via INTRAVENOUS

## 2017-01-05 MED ORDER — FENTANYL CITRATE (PF) 100 MCG/2ML IJ SOLN
INTRAMUSCULAR | Status: AC
  Filled 2017-01-05: qty 2

## 2017-01-05 MED ORDER — PROPOFOL 500 MG/50ML IV EMUL
INTRAVENOUS | Status: AC
  Filled 2017-01-05: qty 50

## 2017-01-05 MED ORDER — SODIUM CHLORIDE 0.9 % IV SOLN: INTRAVENOUS | Status: DC

## 2017-01-05 MED ORDER — SODIUM CHLORIDE 0.9 % IV SOLN
INTRAVENOUS | Status: DC
  Administered 2017-01-05: 09:00:00 via INTRAVENOUS
  Administered 2017-01-05: 1000 mL via INTRAVENOUS

## 2017-01-05 MED ORDER — PIPERACILLIN-TAZOBACTAM 3.375 G IVPB 30 MIN
3.3750 g | Freq: Once | INTRAVENOUS | Status: AC
  Administered 2017-01-05: 3.375 g via INTRAVENOUS
  Filled 2017-01-05: qty 50

## 2017-01-05 MED ORDER — LIDOCAINE 2% (20 MG/ML) 5 ML SYRINGE
INTRAMUSCULAR | Status: DC | PRN
  Administered 2017-01-05: 40 mg via INTRAVENOUS

## 2017-01-05 MED ORDER — PHENYLEPHRINE HCL 10 MG/ML IJ SOLN
INTRAMUSCULAR | Status: DC | PRN
  Administered 2017-01-05 (×2): 100 ug via INTRAVENOUS

## 2017-01-05 MED ORDER — PROPOFOL 500 MG/50ML IV EMUL
INTRAVENOUS | Status: DC | PRN
  Administered 2017-01-05: 140 ug/kg/min via INTRAVENOUS

## 2017-01-05 MED ORDER — MIDAZOLAM HCL 2 MG/2ML IJ SOLN
INTRAMUSCULAR | Status: AC
  Filled 2017-01-05: qty 2

## 2017-01-05 MED ORDER — FENTANYL CITRATE (PF) 100 MCG/2ML IJ SOLN
INTRAMUSCULAR | Status: DC | PRN
  Administered 2017-01-05: 50 ug via INTRAVENOUS

## 2017-01-05 MED ORDER — EPHEDRINE SULFATE 50 MG/ML IJ SOLN
INTRAMUSCULAR | Status: DC | PRN
  Administered 2017-01-05 (×2): 10 mg via INTRAVENOUS

## 2017-01-05 NOTE — H&P (Signed)
Primary Care Physician:  Crecencio Mc, MD Primary Gastroenterologist:  Dr. Vira Agar  Pre-Procedure History & Physical: HPI:  Haley Love is a 83 y.o. female is here for an colonoscopy.   Past Medical History:  Diagnosis Date  . Breast cancer ([place])   . Cataract   . Chickenpox   . Contact lens/glasses fitting   . Diabetes mellitus without complication ([place])   . Hyperlipidemia   . Hypertension   . Inflammatory polyps of colon [facility])     Past Surgical History:  Procedure Laterality Date  . BREAST SURGERY  9/13   lt lump  . cataracts  2014  . JOINT REPLACEMENT Right July 2015   [place]en, right knee  . KNEE ARTHROSCOPY W/ MENISCAL REPAIR  09/23/11   Right  . MASTECTOMY, PARTIAL  08/06/2012   Procedure: MASTECTOMY PARTIAL;  Surgeon: Adin Hector, MD;  Location: [facility];  Service: General;  Laterality: Left;  Left partial mastectomy with re-excision of margins    Prior to Admission medications   Medication Sig Start Date End Date Taking? Authorizing Provider  lisinopril-hydrochlorothiazide (PRINZIDE,ZESTORETIC) 10-12.5 MG tablet TAKE 1 TABLET BY MOUTH DAILY. 03/29/16  Yes Crecencio Mc, MD  aspirin 81 MG tablet Take 81 mg by mouth daily.    Historical Provider, MD  b complex vitamins tablet Take 1 tablet by mouth daily.    Historical Provider, MD  B Complex-C (RA B-COMPLEX/VITAMIN C CR) TBCR Take by mouth.    Historical Provider, MD  Borage, Borago officinalis, (BORAGE OIL PO) Take 1,000 mg by mouth daily.    Historical Provider, MD  Calcium Carbonate-Vit D-Min (RA CALCIUM 600/VIT D/MINERALS) 600-200 MG-UNIT TABS Take tablets by mouth once a day.    Historical Provider, MD  Cholecalciferol (VITAMIN D-3) 1000 UNITS CAPS Take 1 capsule by mouth at bedtime.    Historical Provider, MD  CINNAMON PO Take 2,000 mg by mouth at bedtime.    Historical Provider, MD  Coenzyme Q10 (CO Q-10) 200 MG CAPS Take 1 capsule by mouth at bedtime.    Historical Provider, MD   Flaxseed, Linseed, (FLAX SEED OIL) 1000 MG CAPS Take by mouth.    Historical Provider, MD  Indole-3-Carbinol POWD Take 200 mg by mouth 2 (two) times daily.    Historical Provider, MD  Javier Docker Oil 1500 MG CAPS Take by mouth.    Historical Provider, MD  Magnesium 500 MG CAPS Take 1 capsule by mouth daily.     Historical Provider, MD  Misc Natural Products (NARCOSOFT HERBAL LAX PO) Take 2 tablets by mouth at bedtime.     Historical Provider, MD  Nutritional Supplements (NUTRITIONAL SUPPLEMENT PO) Take 3 capsules by mouth 2 (two) times daily. Neprinol AFD>    Historical Provider, MD  Red Yeast Rice Extract (RED YEAST RICE PO) Take 1,000 mg by mouth 2 (two) times daily.    Historical Provider, MD  TDaP Durwin Reges) 5-2.5-18.5 LF-MCG/0.5 injection Inject 0.5 mLs into the muscle once. Patient not taking: Reported on 08/06/2016 08/03/13   Crecencio Mc, MD  vitamin E 400 UNIT capsule Take 400 Units by mouth daily.    Historical Provider, MD    Allergies as of 12/17/2016 - Review Complete 08/07/2016  Allergen Reaction Noted  . No known allergies  07/25/2014    Family History  Problem Relation Age of Onset  . Lung cancer Mother   . Arthritis Mother   . Hyperlipidemia Mother   . Hypertension Mother   . Throat  cancer Father   . Hyperlipidemia Father   . Cancer Paternal Aunt     breast    Social History   Social History  . Marital status: Divorced    Spouse name: N/A  . Number of children: N/A  . Years of education: N/A   Occupational History  . Not on file.   Social History Main Topics  . Smoking status: Never Smoker  . Smokeless tobacco: Never Used  . Alcohol use Yes     Comment: 1 glass wine week  . Drug use: No  . Sexual activity: Not Currently    Birth control/ protection: Post-menopausal   Other Topics Concern  . Not on file   Social History Narrative   Lives alone ,      Review of Systems: See HPI, otherwise negative ROS  Physical Exam: BP (!) 150/68   Pulse 99    Temp 98.2 F (36.8 C) (Tympanic)   Resp 14   Ht 5\' 7"  (1.702 m)   Wt 77.1 kg (170 lb)   SpO2 100%   BMI 26.63 kg/m  General:   Alert,  pleasant and cooperative in NAD Head:  Normocephalic and atraumatic. Neck:  Supple; no masses or thyromegaly. Lungs:  Clear throughout to auscultation.    Heart:  Regular rate and rhythm. Abdomen:  Soft, nontender and nondistended. Normal bowel sounds, without guarding, and without rebound.   Neurologic:  Alert and  oriented x4;  grossly normal neurologically.  Impression/Plan: Haley Love is here for an colonoscopy to be performed for [facility] colon polyps  Risks, benefits, limitations, and alternatives regarding  colonoscopy have been reviewed with the patient.  Questions have been answered.  All parties agreeable.   Gaylyn Cheers, MD  01/05/2017, 9:12 AM

## 2017-01-05 NOTE — Op Note (Signed)
[facility] Gastroenterology Patient Name: Haley Love Procedure Date: 01/05/2017 9:07 AM MRN: [id number] Account #: [id number] Date of Birth: 12-Aug-1934 Admit Type: Outpatient Age: 83 Room: [facility] ENDO ROOM 1 Gender: Female Note Status: Finalized Procedure:            Colonoscopy Indications:          High risk colon cancer surveillance: Personal history                        of colonic polyps Providers:            Manya Silvas, MD Referring MD:         Deborra Medina, MD (Referring MD) Medicines:            Propofol per Anesthesia Complications:        No immediate complications. Procedure:            Pre-Anesthesia Assessment:                       - After reviewing the risks and benefits, the patient                        was deemed in satisfactory condition to undergo the                        procedure.                       After obtaining informed consent, the colonoscope was                        passed under direct vision. Throughout the procedure,                        the patient's blood pressure, pulse, and oxygen                        saturations were monitored continuously. The Olympus                        PCF-H180AL colonoscope ( S#: [id number] ) was introduced                        through the anus and advanced to the the cecum,                        identified by appendiceal orifice and ileocecal valve.                        The colonoscopy was performed without difficulty. The                        patient tolerated the procedure well. The quality of                        the bowel preparation was excellent. Findings:      A small polyp was found in the transverse colon. The polyp was sessile.       The polyp was removed with a hot snare. Resection and retrieval were       complete.  Two sessile polyps were found in the ascending colon and cecum. The       polyps were small in size. These polyps were removed with a cold snare.        Resection and retrieval were complete.      Internal hemorrhoids were found during endoscopy. The hemorrhoids were       small and Grade I (internal hemorrhoids that do not prolapse).      The exam was otherwise without abnormality. Impression:           - One small polyp in the transverse colon, removed with                        a hot snare. Resected and retrieved.                       - Two small polyps in the ascending colon and in the                        cecum, removed with a cold snare. Resected and                        retrieved.                       - Internal hemorrhoids.                       - The examination was otherwise normal. Recommendation:       - Await pathology results. Manya Silvas, MD 01/05/2017 9:44:59 AM This report has been signed electronically. Number of Addenda: 0 Note Initiated On: 01/05/2017 9:07 AM Scope Withdrawal Time: 0 hours 13 minutes 14 seconds  Total Procedure Duration: 0 hours 22 minutes 26 seconds       [facility]

## 2017-01-05 NOTE — Anesthesia Postprocedure Evaluation (Signed)
Anesthesia Post Note  Patient: MELICIA ESQUEDA  Procedure(s) Performed: Procedure(s) (LRB): COLONOSCOPY WITH PROPOFOL (N/A)  Patient location during evaluation: Endoscopy Anesthesia Type: General Level of consciousness: awake and alert and oriented Pain management: pain level controlled Vital Signs Assessment: post-procedure vital signs reviewed and stable Respiratory status: spontaneous breathing, nonlabored ventilation and respiratory function stable Cardiovascular status: blood pressure returned to baseline and stable Postop Assessment: no signs of nausea or vomiting Anesthetic complications: no     Last Vitals:  Vitals:   01/05/17 0949 01/05/17 0953  BP:  (!) 92/40  Pulse: 88 89  Resp: 20 14  Temp: 36.1 C     Last Pain:  Vitals:   01/05/17 0940  TempSrc: Tympanic                 Mak Bonny

## 2017-01-05 NOTE — Anesthesia Preprocedure Evaluation (Signed)
Anesthesia Evaluation  Patient identified by MRN, date of birth, ID band Patient awake    Reviewed: Allergy & Precautions, NPO status , Patient's Chart, lab work & pertinent test results  History of Anesthesia Complications Negative for: history of anesthetic complications  Airway Mallampati: II  TM Distance: >3 FB Neck ROM: Full    Dental no notable dental hx.    Pulmonary neg pulmonary ROS, neg sleep apnea, neg COPD,    breath sounds clear to auscultation- rhonchi (-) wheezing      Cardiovascular Exercise Tolerance: Good hypertension, Pt. on medications (-) CAD and (-) Past MI  Rhythm:Regular Rate:Normal - Systolic murmurs and - Diastolic murmurs    Neuro/Psych negative neurological ROS  negative psych ROS   GI/Hepatic negative GI ROS, Neg liver ROS,   Endo/Other  diabetes (prediabetic, diet controlled)  Renal/GU negative Renal ROS     Musculoskeletal negative musculoskeletal ROS (+)   Abdominal (+) - obese,   Peds  Hematology  (+) anemia ,   Anesthesia Other Findings   Reproductive/Obstetrics                             Anesthesia Physical Anesthesia Plan  ASA: II  Anesthesia Plan: General   Post-op Pain Management:    Induction: Intravenous  Airway Management Planned: Natural Airway  Additional Equipment:   Intra-op Plan:   Post-operative Plan:   Informed Consent: I have reviewed the patients History and Physical, chart, labs and discussed the procedure including the risks, benefits and alternatives for the proposed anesthesia with the patient or authorized representative who has indicated his/her understanding and acceptance.   Dental advisory given  Plan Discussed with: CRNA and Anesthesiologist  Anesthesia Plan Comments:         Anesthesia Quick Evaluation

## 2017-01-05 NOTE — Transfer of Care (Signed)
Immediate Anesthesia Transfer of Care Note  Patient: Haley Love  Procedure(s) Performed: Procedure(s): COLONOSCOPY WITH PROPOFOL (N/A)  Patient Location: PACU and Endoscopy Unit  Anesthesia Type:General  Level of Consciousness: awake, oriented and patient cooperative  Airway & Oxygen Therapy: Patient Spontanous Breathing and Patient connected to nasal cannula oxygen  Post-op Assessment: Report given to RN and Post -op Vital signs reviewed and stable  Post vital signs: Reviewed and stable  Last Vitals:  Vitals:   01/05/17 0821  BP: (!) 150/68  Pulse: 99  Resp: 14  Temp: 36.8 C    Last Pain:  Vitals:   01/05/17 0821  TempSrc: Tympanic         Complications: No apparent anesthesia complications

## 2017-01-05 NOTE — Anesthesia Post-op Follow-up Note (Cosign Needed)
Anesthesia QCDR form completed.        

## 2017-01-06 ENCOUNTER — Encounter: Payer: Self-pay | Admitting: Unknown Physician Specialty

## 2017-01-06 LAB — SURGICAL PATHOLOGY

## 2017-01-28 ENCOUNTER — Ambulatory Visit (INDEPENDENT_AMBULATORY_CARE_PROVIDER_SITE_OTHER): Payer: Medicare HMO

## 2017-01-28 VITALS — BP 132/72 | HR 85 | Temp 97.9°F | Resp 12 | Ht 67.0 in | Wt 176.4 lb

## 2017-01-28 DIAGNOSIS — Z0000 Encounter for general adult medical examination without abnormal findings: Secondary | ICD-10-CM

## 2017-01-28 NOTE — Progress Notes (Addendum)
Subjective:   Haley Love is a 83 y.o. female who presents for Medicare Annual (Subsequent) preventive examination.  Review of Systems:  No ROS.  Medicare Wellness Visit. Cardiac Risk Factors include: advanced age (>95men, >2 women);diabetes mellitus;hypertension     Objective:     Vitals: BP 132/72 (BP Location: Right Arm, Patient Position: Sitting, Cuff Size: Normal)   Pulse 85   Temp 97.9 F (36.6 C) (Oral)   Resp 12   Ht 5\' 7"  (1.702 m)   Wt 176 lb 6.4 oz (80 kg)   SpO2 97%   BMI 27.63 kg/m   Body mass index is 27.63 kg/m.   Tobacco History  Smoking Status  . Never Smoker  Smokeless Tobacco  . Never Used     Counseling given: Not Answered   Past Medical History:  Diagnosis Date  . Breast cancer ([place])   . Cataract   . Chickenpox   . Contact lens/glasses fitting   . Diabetes mellitus without complication ([place])   . Hyperlipidemia   . Hypertension   . Inflammatory polyps of colon [facility])    Past Surgical History:  Procedure Laterality Date  . BREAST SURGERY  9/13   lt lump  . cataracts  2014  . COLONOSCOPY WITH PROPOFOL N/A 01/05/2017   Procedure: COLONOSCOPY WITH PROPOFOL;  Surgeon: Manya Silvas, MD;  Location: [facility] ENDOSCOPY;  Service: Endoscopy;  Laterality: N/A;  . JOINT REPLACEMENT Right July 2015   [place], right knee  . KNEE ARTHROSCOPY W/ MENISCAL REPAIR  09/23/11   Right  . MASTECTOMY, PARTIAL  08/06/2012   Procedure: MASTECTOMY PARTIAL;  Surgeon: Adin Hector, MD;  Location: [place];  Service: General;  Laterality: Left;  Left partial mastectomy with re-excision of margins   Family History  Problem Relation Age of Onset  . Lung cancer Mother   . Arthritis Mother   . Hyperlipidemia Mother   . Hypertension Mother   . Throat cancer Father   . Hyperlipidemia Father   . Cancer Paternal Aunt     breast   History  Sexual Activity  . Sexual activity: No    Outpatient Encounter Prescriptions as of 01/28/2017    Medication Sig  . aspirin 81 MG tablet Take 81 mg by mouth daily.  Marland Kitchen b complex vitamins tablet Take 1 tablet by mouth daily.  . B Complex-C (RA B-COMPLEX/VITAMIN C CR) TBCR Take by mouth.  Chong Sicilian, Borago officinalis, (BORAGE OIL PO) Take 1,000 mg by mouth daily.  . Calcium Carbonate-Vit D-Min (RA CALCIUM 600/VIT D/MINERALS) 600-200 MG-UNIT TABS Take tablets by mouth once a day.  . Cholecalciferol (VITAMIN D-3) 1000 UNITS CAPS Take 1 capsule by mouth at bedtime.  Marland Kitchen CINNAMON PO Take 2,000 mg by mouth at bedtime.  . Coenzyme Q10 (CO Q-10) 200 MG CAPS Take 1 capsule by mouth at bedtime.  . Flaxseed, Linseed, (FLAX SEED OIL) 1000 MG CAPS Take by mouth.  . Indole-3-Carbinol POWD Take 200 mg by mouth 2 (two) times daily.  Javier Docker Oil 1500 MG CAPS Take by mouth.  Marland Kitchen lisinopril-hydrochlorothiazide (PRINZIDE,ZESTORETIC) 10-12.5 MG tablet TAKE 1 TABLET BY MOUTH DAILY.  . Magnesium 500 MG CAPS Take 1 capsule by mouth daily.   . Misc Natural Products (NARCOSOFT HERBAL LAX PO) Take 2 tablets by mouth at bedtime.   . Nutritional Supplements (NUTRITIONAL SUPPLEMENT PO) Take 3 capsules by mouth 2 (two) times daily. Neprinol AFD>  . Red Yeast Rice Extract (RED YEAST RICE PO) Take  1,000 mg by mouth 2 (two) times daily.  . vitamin E 400 UNIT capsule Take 400 Units by mouth daily.  . TDaP (BOOSTRIX) 5-2.5-18.5 LF-MCG/0.5 injection Inject 0.5 mLs into the muscle once. (Patient not taking: Reported on 01/28/2017)   No facility-administered encounter medications on file as of 01/28/2017.     Activities of Daily Living In your present state of health, do you have any difficulty performing the following activities: 01/28/2017  Hearing? N  Vision? N  Difficulty concentrating or making decisions? N  Walking or climbing stairs? N  Dressing or bathing? N  Doing errands, shopping? N  Preparing Food and eating ? N  Using the Toilet? N  In the past six months, have you accidently leaked urine? N  Do you have problems  with loss of bowel control? N  Managing your Medications? N  Managing your Finances? N  Housekeeping or managing your Housekeeping? N  Some recent data might be hidden    Patient Care Team: Crecencio Mc, MD as PCP - General (Internal Medicine)    Assessment:    This is a routine wellness examination for Haley Love. The goal of the wellness visit is to assist the patient how to close the gaps in care and create a preventative care plan for the patient.   Taking calcium VIT D as appropriate/Osteoporosis risk reviewed.  Medications reviewed; taking without issues or barriers.  Safety issues reviewed; alarm system with smoke detectors in the home. No firearms in the home.  Wears seatbelts when driving or riding with others. Patient does wear sunscreen or protective clothing when in direct sunlight. No violence in the home.  Patient is alert, normal appearance, oriented to person/place/and time. Correctly identified the president of the [place], recall of 2/3 objects, and performing simple calculations.  Patient displays appropriate judgement and can read correct time from watch face.  No new identified risk were noted.  No failures at ADL's or IADL's.   BMI- discussed the importance of a healthy diet, water intake and exercise. Educational material provided.   HTN- followed by PCP.  Dental- every six months.  Eye- Visual acuity not assessed per patient preference since they have regular follow up with the ophthalmologist.  Wears corrective lenses.  Sleep patterns- Sleeps 7-8 hours at night.  Wakes feeling rested.  Patient Concerns: None at this time. Follow up with PCP as needed.  Exercise Activities and Dietary recommendations Current Exercise Habits: Home exercise routine, Type of exercise: walking, Time (Minutes): 40, Frequency (Times/Week): 5, Weekly Exercise (Minutes/Week): 200, Intensity: Moderate  Goals    . Increase water intake          Stay hydrated and drink plenty  of fluids/water      Fall Risk Fall Risk  01/28/2017 08/29/2015 09/02/2013  Falls in the past year? No No No   Depression Screen PHQ 2/9 Scores 01/28/2017 08/29/2015 09/02/2013  PHQ - 2 Score 0 0 0     Cognitive Function MMSE - Mini Mental State Exam 01/28/2017  Orientation to time 5  Orientation to Place 5  Registration 3  Attention/ Calculation 5  Recall 3  Language- name 2 objects 2  Language- repeat 1  Language- follow 3 step command 3  Language- read & follow direction 1  Write a sentence 1  Copy design 1  Total score 30        Immunization History  Administered Date(s) Administered  . Zoster 12/22/2011   Screening Tests Health Maintenance  Topic Date  Due  . FOOT EXAM  11/21/1944  . OPHTHALMOLOGY EXAM  11/21/1944  . TETANUS/TDAP  11/22/1955  . PNA vac Low Risk Adult (1 of 2 - PCV13) 11/21/2001  . HEMOGLOBIN A1C  05/22/2016  . INFLUENZA VACCINE  02/21/2017 (Originally 06/24/2016)  . DEXA SCAN  Completed      Plan:    End of life planning; Advance aging; Advanced directives discussed. Copy of current HCPOA/Living Will requested.    Medicare Attestation I have personally reviewed: The patient's medical and social history Their use of alcohol, tobacco or illicit drugs Their current medications and supplements The patient's functional ability including ADLs,fall risks, home safety risks, cognitive, and hearing and visual impairment Diet and physical activities Evidence for depression   The patient's weight, height, BMI, and visual acuity have been recorded in the chart.  I have made referrals and provided education to the patient based on review of the above and I have provided the patient with a written personalized care plan for preventive services.    During the course of the visit the patient was educated and counseled about the following appropriate screening and preventive services:   Vaccines to include Pneumoccal, Influenza, Hepatitis B, Td, Zostavax,  HCV  Colorectal cancer screening-UTD  Diabetes-followed by PCP  Glaucoma screening-annual eye exam  Mammography-UTD  Nutrition counseling   Patient Instructions (the written plan) was given to the patient.   OBrien-Blaney, Lashya Passe L, LPN  04/30/8937    I have reviewed the above information and agree with above.   Deborra Medina, MD

## 2017-01-28 NOTE — Patient Instructions (Addendum)
  Haley Love , Thank you for taking time to come for your Medicare Wellness Visit. I appreciate your ongoing commitment to your health goals. Please review the following plan we discussed and let me know if I can assist you in the future.   Follow up with Dr. Derrel Nip as needed.    Bring a copy of your [place] and/or Living Will to be scanned into chart.  Have a great day!  These are the goals we discussed: Goals    . Increase water intake          Stay hydrated and drink plenty of fluids/water       This is a list of the screening recommended for you and due dates:  Health Maintenance  Topic Date Due  . Complete foot exam   11/21/1944  . Eye exam for diabetics  11/21/1944  . Tetanus Vaccine  11/22/1955  . Pneumonia vaccines (1 of 2 - PCV13) 11/21/2001  . Hemoglobin A1C  05/22/2016  . Flu Shot  02/21/2017*  . DEXA scan (bone density measurement)  Completed  *Topic was postponed. The date shown is not the original due date.

## 2017-03-28 ENCOUNTER — Other Ambulatory Visit: Payer: Self-pay | Admitting: Internal Medicine

## 2017-04-23 DIAGNOSIS — D2272 Melanocytic nevi of left lower limb, including hip: Secondary | ICD-10-CM | POA: Diagnosis not present

## 2017-04-23 DIAGNOSIS — L570 Actinic keratosis: Secondary | ICD-10-CM | POA: Diagnosis not present

## 2017-04-23 DIAGNOSIS — Z872 Personal history of diseases of the skin and subcutaneous tissue: Secondary | ICD-10-CM | POA: Diagnosis not present

## 2017-04-23 DIAGNOSIS — Z08 Encounter for follow-up examination after completed treatment for malignant neoplasm: Secondary | ICD-10-CM | POA: Diagnosis not present

## 2017-04-23 DIAGNOSIS — D485 Neoplasm of uncertain behavior of skin: Secondary | ICD-10-CM | POA: Diagnosis not present

## 2017-04-23 DIAGNOSIS — Z85828 Personal history of other malignant neoplasm of skin: Secondary | ICD-10-CM | POA: Diagnosis not present

## 2017-04-23 DIAGNOSIS — X32XXXA Exposure to sunlight, initial encounter: Secondary | ICD-10-CM | POA: Diagnosis not present

## 2017-05-28 DIAGNOSIS — D2272 Melanocytic nevi of left lower limb, including hip: Secondary | ICD-10-CM | POA: Diagnosis not present

## 2017-05-28 DIAGNOSIS — D485 Neoplasm of uncertain behavior of skin: Secondary | ICD-10-CM | POA: Diagnosis not present

## 2017-06-09 ENCOUNTER — Other Ambulatory Visit: Payer: Self-pay | Admitting: Internal Medicine

## 2017-06-09 MED ORDER — LISINOPRIL-HYDROCHLOROTHIAZIDE 10-12.5 MG PO TABS: 1.0000 | ORAL_TABLET | Freq: Every day | ORAL | 0 refills | Status: DC

## 2017-06-09 NOTE — Telephone Encounter (Signed)
Pt called requesting a refill on her lisinopril-hydrochlorothiazide (PRINZIDE,ZESTORETIC) 10-12.5 MG tablet. Pt only has 3 pills left, she is scheduled for 8/28. Please advise, thank you!  Pharmacy - [id number] - [place], [address]

## 2017-06-09 NOTE — Telephone Encounter (Signed)
Refilled till appt, then can follow up.

## 2017-07-21 ENCOUNTER — Encounter: Payer: Medicare HMO | Admitting: Internal Medicine

## 2017-07-22 DIAGNOSIS — D0462 Carcinoma in situ of skin of left upper limb, including shoulder: Secondary | ICD-10-CM | POA: Diagnosis not present

## 2017-07-22 DIAGNOSIS — D485 Neoplasm of uncertain behavior of skin: Secondary | ICD-10-CM | POA: Diagnosis not present

## 2017-07-24 ENCOUNTER — Ambulatory Visit
Admission: RE | Admit: 2017-07-24 | Discharge: 2017-07-24 | Disposition: A | Discharge status: Home / Self Care | Payer: Medicare HMO | Source: Ambulatory Visit | Attending: Adult Health | Admitting: Adult Health

## 2017-07-24 DIAGNOSIS — C50512 Malignant neoplasm of lower-outer quadrant of left female breast: Secondary | ICD-10-CM

## 2017-07-24 DIAGNOSIS — R928 Other abnormal and inconclusive findings on diagnostic imaging of breast: Secondary | ICD-10-CM | POA: Diagnosis not present

## 2017-08-05 ENCOUNTER — Other Ambulatory Visit: Payer: Self-pay

## 2017-08-05 ENCOUNTER — Ambulatory Visit (INDEPENDENT_AMBULATORY_CARE_PROVIDER_SITE_OTHER): Payer: Medicare HMO

## 2017-08-05 ENCOUNTER — Encounter: Payer: Self-pay | Admitting: Internal Medicine

## 2017-08-05 ENCOUNTER — Ambulatory Visit (INDEPENDENT_AMBULATORY_CARE_PROVIDER_SITE_OTHER): Payer: Medicare HMO | Admitting: Internal Medicine

## 2017-08-05 VITALS — BP 112/58 | HR 81 | Temp 98.3°F | Resp 14 | Ht 67.0 in | Wt 169.0 lb

## 2017-08-05 DIAGNOSIS — M25551 Pain in right hip: Secondary | ICD-10-CM

## 2017-08-05 DIAGNOSIS — M5431 Sciatica, right side: Secondary | ICD-10-CM | POA: Diagnosis not present

## 2017-08-05 DIAGNOSIS — N183 Chronic kidney disease, stage 3 unspecified: Secondary | ICD-10-CM

## 2017-08-05 DIAGNOSIS — C50512 Malignant neoplasm of lower-outer quadrant of left female breast: Secondary | ICD-10-CM

## 2017-08-05 DIAGNOSIS — D631 Anemia in chronic kidney disease: Secondary | ICD-10-CM | POA: Diagnosis not present

## 2017-08-05 DIAGNOSIS — E119 Type 2 diabetes mellitus without complications: Secondary | ICD-10-CM

## 2017-08-05 DIAGNOSIS — Z0000 Encounter for general adult medical examination without abnormal findings: Secondary | ICD-10-CM | POA: Diagnosis not present

## 2017-08-05 DIAGNOSIS — I10 Essential (primary) hypertension: Secondary | ICD-10-CM

## 2017-08-05 DIAGNOSIS — M5136 Other intervertebral disc degeneration, lumbar region: Secondary | ICD-10-CM | POA: Diagnosis not present

## 2017-08-05 DIAGNOSIS — D472 Monoclonal gammopathy: Secondary | ICD-10-CM | POA: Diagnosis not present

## 2017-08-05 DIAGNOSIS — E785 Hyperlipidemia, unspecified: Secondary | ICD-10-CM

## 2017-08-05 LAB — HEMOGLOBIN A1C: Hgb A1c MFr Bld: 6.6 % — ABNORMAL HIGH (ref 4.6–6.5)

## 2017-08-05 LAB — CBC WITH DIFFERENTIAL/PLATELET
BASOS ABS: 0.1 10*3/uL (ref 0.0–0.1)
Basophils Relative: 0.8 % (ref 0.0–3.0)
Eosinophils Absolute: 0.1 10*3/uL (ref 0.0–0.7)
Eosinophils Relative: 1 % (ref 0.0–5.0)
HCT: 44.1 % (ref 36.0–46.0)
Hemoglobin: 14.9 g/dL (ref 12.0–15.0)
LYMPHS ABS: 7.6 10*3/uL — AB (ref 0.7–4.0)
LYMPHS PCT: 55.2 % — AB (ref 12.0–46.0)
MCHC: 33.7 g/dL (ref 30.0–36.0)
MCV: 91.1 fl (ref 78.0–100.0)
MONOS PCT: 4.4 % (ref 3.0–12.0)
Monocytes Absolute: 0.6 10*3/uL (ref 0.1–1.0)
NEUTROS PCT: 38.6 % — AB (ref 43.0–77.0)
Neutro Abs: 5.3 10*3/uL (ref 1.4–7.7)
Platelets: 280 10*3/uL (ref 150.0–400.0)
RBC: 4.84 Mil/uL (ref 3.87–5.11)
RDW: 12.8 % (ref 11.5–15.5)
WBC: 13.8 10*3/uL — ABNORMAL HIGH (ref 4.0–10.5)

## 2017-08-05 LAB — LIPID PANEL
Cholesterol: 293 mg/dL — ABNORMAL HIGH (ref 0–200)
HDL: 51.8 mg/dL (ref >39.00)
NONHDL: 240.8
Total CHOL/HDL Ratio: 6
Triglycerides: 239 mg/dL — ABNORMAL HIGH (ref 0.0–149.0)
VLDL: 47.8 mg/dL — ABNORMAL HIGH (ref 0.0–40.0)

## 2017-08-05 LAB — LDL CHOLESTEROL, DIRECT: LDL DIRECT: 200 mg/dL

## 2017-08-05 LAB — TSH: TSH: 0.97 u[IU]/mL (ref 0.35–4.50)

## 2017-08-05 NOTE — Addendum Note (Signed)
Addended by: Leeanne Rio on: 08/05/2017 11:16 AM   Modules accepted: Orders

## 2017-08-05 NOTE — Patient Instructions (Addendum)
Your hip pain may be coming from your lower spine  If your x rays suggest this,  I will order an MRI  If not,  I will order an orthopedic referral to see if a steroid shot in your hip will help relieve your pain .   Please make a 6 month follow up visit with me to manage your hypertension    Health Maintenance for Postmenopausal Women Menopause is a normal process in which your reproductive ability comes to an end. This process happens gradually over a span of months to years, usually between the ages of 62 and 48. Menopause is complete when you have missed 12 consecutive menstrual periods. It is important to talk with your health care provider about some of the most common conditions that affect postmenopausal women, such as heart disease, cancer, and bone loss (osteoporosis). Adopting a healthy lifestyle and getting preventive care can help to promote your health and wellness. Those actions can also lower your chances of developing some of these common conditions. What should I know about menopause? During menopause, you may experience a number of symptoms, such as:  Moderate-to-severe hot flashes.  Night sweats.  Decrease in sex drive.  Mood swings.  Headaches.  Tiredness.  Irritability.  Memory problems.  Insomnia.  Choosing to treat or not to treat menopausal changes is an individual decision that you make with your health care provider. What should I know about hormone replacement therapy and supplements? Hormone therapy products are effective for treating symptoms that are associated with menopause, such as hot flashes and night sweats. Hormone replacement carries certain risks, especially as you become older. If you are thinking about using estrogen or estrogen with progestin treatments, discuss the benefits and risks with your health care provider. What should I know about heart disease and stroke? Heart disease, heart attack, and stroke become more likely as you age.  This may be due, in part, to the hormonal changes that your body experiences during menopause. These can affect how your body processes dietary fats, triglycerides, and cholesterol. Heart attack and stroke are both medical emergencies. There are many things that you can do to help prevent heart disease and stroke:  Have your blood pressure checked at least every 1-2 years. High blood pressure causes heart disease and increases the risk of stroke.  If you are 28-93 years old, ask your health care provider if you should take aspirin to prevent a heart attack or a stroke.  Do not use any tobacco products, including cigarettes, chewing tobacco, or electronic cigarettes. If you need help quitting, ask your health care provider.  It is important to eat a healthy diet and maintain a healthy weight. ? Be sure to include plenty of vegetables, fruits, low-fat dairy products, and lean protein. ? Avoid eating foods that are high in solid fats, added sugars, or salt (sodium).  Get regular exercise. This is one of the most important things that you can do for your health. ? Try to exercise for at least 150 minutes each week. The type of exercise that you do should increase your heart rate and make you sweat. This is known as moderate-intensity exercise. ? Try to do strengthening exercises at least twice each week. Do these in addition to the moderate-intensity exercise.  Know your numbers.Ask your health care provider to check your cholesterol and your blood glucose. Continue to have your blood tested as directed by your health care provider.  What should I know about cancer screening?  There are several types of cancer. Take the following steps to reduce your risk and to catch any cancer development as early as possible. Breast Cancer  Practice breast self-awareness. ? This means understanding how your breasts normally appear and feel. ? It also means doing regular breast self-exams. Let your health care  provider know about any changes, no matter how small.  If you are 72 or older, have a clinician do a breast exam (clinical breast exam or CBE) every year. Depending on your age, family history, and medical history, it may be recommended that you also have a yearly breast X-ray (mammogram).  If you have a family history of breast cancer, talk with your health care provider about genetic screening.  If you are at high risk for breast cancer, talk with your health care provider about having an MRI and a mammogram every year.  Breast cancer (BRCA) gene test is recommended for women who have family members with BRCA-related cancers. Results of the assessment will determine the need for genetic counseling and BRCA1 and for BRCA2 testing. BRCA-related cancers include these types: ? Breast. This occurs in males or females. ? Ovarian. ? Tubal. This may also be called fallopian tube cancer. ? Cancer of the abdominal or pelvic lining (peritoneal cancer). ? Prostate. ? Pancreatic.  Cervical, Uterine, and Ovarian Cancer Your health care provider may recommend that you be screened regularly for cancer of the pelvic organs. These include your ovaries, uterus, and vagina. This screening involves a pelvic exam, which includes checking for microscopic changes to the surface of your cervix (Pap test).  For women ages 21-65, health care providers may recommend a pelvic exam and a Pap test every three years. For women ages 44-65, they may recommend the Pap test and pelvic exam, combined with testing for human papilloma virus (HPV), every five years. Some types of HPV increase your risk of cervical cancer. Testing for HPV may also be done on women of any age who have unclear Pap test results.  Other health care providers may not recommend any screening for nonpregnant women who are considered low risk for pelvic cancer and have no symptoms. Ask your health care provider if a screening pelvic exam is right for  you.  If you have had past treatment for cervical cancer or a condition that could lead to cancer, you need Pap tests and screening for cancer for at least 20 years after your treatment. If Pap tests have been discontinued for you, your risk factors (such as having a new sexual partner) need to be reassessed to determine if you should start having screenings again. Some women have medical problems that increase the chance of getting cervical cancer. In these cases, your health care provider may recommend that you have screening and Pap tests more often.  If you have a family history of uterine cancer or ovarian cancer, talk with your health care provider about genetic screening.  If you have vaginal bleeding after reaching menopause, tell your health care provider.  There are currently no reliable tests available to screen for ovarian cancer.  Lung Cancer Lung cancer screening is recommended for adults 16-38 years old who are at high risk for lung cancer because of a history of smoking. A yearly low-dose CT scan of the lungs is recommended if you:  Currently smoke.  Have a history of at least 30 pack-years of smoking and you currently smoke or have quit within the past 15 years. A pack-year is smoking an  average of one pack of cigarettes per day for one year.  Yearly screening should:  Continue until it has been 15 years since you quit.  Stop if you develop a health problem that would prevent you from having lung cancer treatment.  Colorectal Cancer  This type of cancer can be detected and can often be prevented.  Routine colorectal cancer screening usually begins at age 50 and continues through age 75.  If you have risk factors for colon cancer, your health care provider may recommend that you be screened at an earlier age.  If you have a family history of colorectal cancer, talk with your health care provider about genetic screening.  Your health care provider may also recommend  using home test kits to check for hidden blood in your stool.  A small camera at the end of a tube can be used to examine your colon directly (sigmoidoscopy or colonoscopy). This is done to check for the earliest forms of colorectal cancer.  Direct examination of the colon should be repeated every 5-10 years until age 75. However, if early forms of precancerous polyps or small growths are found or if you have a family history or genetic risk for colorectal cancer, you may need to be screened more often.  Skin Cancer  Check your skin from head to toe regularly.  Monitor any moles. Be sure to tell your health care provider: ? About any new moles or changes in moles, especially if there is a change in a mole's shape or color. ? If you have a mole that is larger than the size of a pencil eraser.  If any of your family members has a history of skin cancer, especially at a young age, talk with your health care provider about genetic screening.  Always use sunscreen. Apply sunscreen liberally and repeatedly throughout the day.  Whenever you are outside, protect yourself by wearing long sleeves, pants, a wide-brimmed hat, and sunglasses.  What should I know about osteoporosis? Osteoporosis is a condition in which bone destruction happens more quickly than new bone creation. After menopause, you may be at an increased risk for osteoporosis. To help prevent osteoporosis or the bone fractures that can happen because of osteoporosis, the following is recommended:  If you are 19-50 years old, get at least 1,000 mg of calcium and at least 600 mg of vitamin D per day.  If you are older than age 50 but younger than age 70, get at least 1,200 mg of calcium and at least 600 mg of vitamin D per day.  If you are older than age 70, get at least 1,200 mg of calcium and at least 800 mg of vitamin D per day.  Smoking and excessive alcohol intake increase the risk of osteoporosis. Eat foods that are rich in  calcium and vitamin D, and do weight-bearing exercises several times each week as directed by your health care provider. What should I know about how menopause affects my mental health? Depression may occur at any age, but it is more common as you become older. Common symptoms of depression include:  Low or sad mood.  Changes in sleep patterns.  Changes in appetite or eating patterns.  Feeling an overall lack of motivation or enjoyment of activities that you previously enjoyed.  Frequent crying spells.  Talk with your health care provider if you think that you are experiencing depression. What should I know about immunizations? It is important that you get and maintain your immunizations.   These include:  Tetanus, diphtheria, and pertussis (Tdap) booster vaccine.  Influenza every year before the flu season begins.  Pneumonia vaccine.  Shingles vaccine.  Your health care provider may also recommend other immunizations. This information is not intended to replace advice given to you by your health care provider. Make sure you discuss any questions you have with your health care provider. Document Released: 01/02/2006 Document Revised: 05/30/2016 Document Reviewed: 08/14/2015 Elsevier Interactive Patient Education  2018 [employer].

## 2017-08-05 NOTE — Progress Notes (Addendum)
Patient ID: Haley Love, female    DOB: 1934/07/17  Age: 83 y.o. MRN: [id number]  The patient is here for annual wellness examination and management of other chronic and acute problems, including type 2 DM diet controlled, . Hyperlipidemia managed with RYR,  And hypertension   Mammogram august 2018 by oncology.  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  The roster of all physicians providing medical care to patient - is listed in the Snapshot section of the chart.  Activities of daily living:  The patient is 100% independent in all ADLs: dressing, toileting, feeding as well as independent mobility  Home safety : The patient has smoke detectors in the home. They wear seatbelts.  There are no firearms at home. There is no violence in the home.   There is no risks for hepatitis, STDs or HIV. There is no   history of blood transfusion. They have no travel history to infectious disease endemic areas of the world.  The patient has seen their dentist in the last six month. They have seen their eye doctor in the last year. They admit to slight hearing difficulty with regard to whispered voices and some television programs.  They have deferred audiologic testing in the last year.  They do not  have excessive sun exposure. Discussed the need for sun protection: hats, long sleeves and use of sunscreen if there is significant sun exposure.   Diet: the importance of a healthy diet is discussed. They do have a healthy diet.  The benefits of regular aerobic exercise were discussed. She walks 4 times per week ,  20 minutes.   Depression screen: there are no signs or vegative symptoms of depression- irritability, change in appetite, anhedonia, sadness/tearfullness.  Cognitive assessment: the patient manages all their financial and personal  affairs and is actively engaged. They could relate day,date,year and events; recalled 2/3 objects at 3 minutes; performed clock-face test normally.  The following portions of the patient's history were reviewed and updated as appropriate: allergies, current medications, past family history, past medical history,  past surgical history, past social history  and problem list.  Visual acuity was not assessed per patient preference since she has regular follow up with her ophthalmologist. Hearing and body mass index were assessed and reviewed.   During the course of the visit the patient was educated and counseled about appropriate screening and preventive services including : fall prevention , diabetes screening, nutrition counseling, colorectal cancer screening, and recommended immunizations.    CC: The primary encounter diagnosis was Encounter for preventive health examination. Diagnoses of Pain of right hip joint, Sciatica of right side, Anemia in stage 3 chronic kidney disease (HCC), IgG monoclonal gammopathy, Diabetes mellitus type 2, diet-controlled ([place]), Breast cancer of lower-outer quadrant of left female breast ([place]), Hyperlipidemia with target LDL less than 100, CKD (chronic kidney disease), stage III, and Essential hypertension were also pertinent to this visit.  Hypertension  hyperlipidemia untreated:  overdue for labs April 2017  Right hip pain,  Radiates to knee and ankle and accompanied  by cramping on upper thigh .  Present for the past 2 months.  No relief with last/3rd  chiropractic treatment involving TENS units and adjustments of lumbar spine by Laveda Abbe.  Films in 2015 mild DJD  Hip and mild DDD lumbar  spine .  Typically walks daily for the past several years but none n 4 months  Due to house problems. Not aggravated by sitting  bt has lateral and sciatic  notch pain aggravated by position change and ascending stairs if sh eleads with the right leg.  Leg cramps in both hips  proximally/inguinal.  Relieved with teaspoon of mustard    History Haley Love has a past medical history of Breast cancer ([place]); Cataract; Chickenpox; Contact lens/glasses fitting; Diabetes mellitus without complication ([place]); Hyperlipidemia; Hypertension; and Inflammatory polyps of colon ([place]).   She has a past surgical history that includes Breast surgery (9/13); Mastectomy, partial (08/06/2012); cataracts (2014); Knee arthroscopy w/ meniscal repair (09/23/11); Joint replacement (Right, July 2015); Colonoscopy with propofol (N/A, 01/05/2017); and Breast lumpectomy (Left, 07/28/2012).   Her family history includes Arthritis in her mother; Cancer in her paternal aunt; Hyperlipidemia in her father and mother; Hypertension in her mother; Lung cancer in her mother; Throat cancer in her father.She reports that she has never smoked. She has never used smokeless tobacco. She reports that she drinks alcohol. She reports that she does not use drugs.  Outpatient Medications Prior to Visit  Medication Sig Dispense Refill  . aspirin 81 MG tablet Take 81 mg by mouth daily.    Marland Kitchen b complex vitamins tablet Take 1 tablet by mouth daily.    . B Complex-C (RA B-COMPLEX/VITAMIN C CR) TBCR Take by mouth.    Chong Sicilian, Borago officinalis, (BORAGE OIL PO) Take 1,000 mg by mouth daily.    . Calcium Carbonate-Vit D-Min (RA CALCIUM 600/VIT D/MINERALS) 600-200 MG-UNIT TABS Take tablets by mouth once a day.    . Cholecalciferol (VITAMIN D-3) 1000 UNITS CAPS Take 1 capsule by mouth at bedtime.    Marland Kitchen CINNAMON PO Take 2,000 mg by mouth at bedtime.    . Coenzyme Q10 (CO Q-10) 200 MG CAPS Take 1 capsule by mouth at bedtime.    . Flaxseed, Linseed, (FLAX SEED OIL) 1000 MG CAPS Take by mouth.    . Indole-3-Carbinol POWD Take 200 mg by mouth 2 (two) times daily.    Javier Docker Oil 1500 MG CAPS Take by mouth.    . Magnesium 500 MG CAPS Take 1 capsule by mouth daily.     . Misc Natural Products (NARCOSOFT HERBAL LAX PO) Take 2 tablets by  mouth at bedtime.     . Nutritional Supplements (NUTRITIONAL SUPPLEMENT PO) Take 3 capsules by mouth 2 (two) times daily. Neprinol AFD>    . Red Yeast Rice Extract (RED YEAST RICE PO) Take 1,000 mg by mouth 2 (two) times daily.    . TDaP (BOOSTRIX) 5-2.5-18.5 LF-MCG/0.5 injection Inject 0.5 mLs into the muscle once. 0.5 mL 0  . vitamin E 400 UNIT capsule Take 400 Units by mouth daily.    Marland Kitchen lisinopril-hydrochlorothiazide (PRINZIDE,ZESTORETIC) 10-12.5 MG tablet Take 1 tablet by mouth daily. 90 tablet 0   No facility-administered medications prior to visit.     Review of Systems   Patient denies headache, fevers, malaise, unintentional weight loss, skin rash, eye pain, sinus congestion and sinus pain, sore throat, dysphagia,  hemoptysis , cough, dyspnea, wheezing, chest pain, palpitations, orthopnea, edema, abdominal pain, nausea, melena, diarrhea, constipation, flank pain, dysuria, hematuria, urinary  Frequency, nocturia, numbness, tingling, seizures,  Focal weakness, Loss of consciousness,  Tremor, insomnia, depression, anxiety, and suicidal ideation.      Objective:  BP (!) 112/58 (BP Location: Left Arm, Patient Position: Sitting, Cuff Size: Normal)   Pulse  81   Temp 98.3 F (36.8 C) (Oral)   Resp 14   Ht 5\' 7"  (1.702 m)   Wt 169 lb (76.7 kg)   SpO2 97%   BMI 26.47 kg/m   Physical Exam   General appearance: alert, cooperative and appears stated age Head: Normocephalic, without obvious abnormality, atraumatic Eyes: conjunctivae/corneas clear. PERRL, EOM's intact. Fundi benign. Ears: normal TM's and external ear canals both ears Nose: Nares normal. Septum midline. Mucosa normal. No drainage or sinus tenderness. Throat: lips, mucosa, and tongue normal; teeth and gums normal Neck: no adenopathy, no carotid bruit, no JVD, supple, symmetrical, trachea midline and thyroid not enlarged, symmetric, no tenderness/mass/nodules Lungs: clear to auscultation bilaterally Breasts: normal  appearance, no masses or tenderness Heart: regular rate and rhythm, S1, S2 normal, no murmur, click, rub or gallop Abdomen: soft, non-tender; bowel sounds normal; no masses,  no organomegaly Extremities: extremities normal, ROm normal both hips,  no cyanosis or edema Pulses: 2+ and symmetric Skin: Skin color, texture, turgor normal. No rashes or lesions Neurologic: Alert and oriented X 3, normal strength and tone. Normal symmetric reflexes. Normal coordination and gait.      Assessment & Plan:   Problem List Items Addressed This Visit    RESOLVED: Anemia in chronic kidney disease   Relevant Orders   TSH (Completed)   CBC with Differential/Platelet (Completed)   CKD (chronic kidney disease), stage III ([place])    Stable.  referral to  Nephrology was done last year and labs reviewed.  Reminded to avoid daily use of aleve and other NSAIDs .  Continue lisinopril. Lab Results  Component Value Date   CREATININE 1.4 (H) 08/06/2017           Diabetes mellitus type 2, diet-controlled ([place])    Well controlled with diet alone. Continue  low glycemic index diet utilizing smaller more frequent meals to increase metabolism.  I have also recommended that patient start exercising with a goal of 30 minutes of aerobic exercise a minimum of 5 days per week.   Lab Results  Component Value Date   HGBA1C 6.6 (H) 08/05/2017   No results found for: MICROALBUR, [id number]        Relevant Orders   Lipid panel (Completed)   Hemoglobin A1c (Completed)   Encounter for preventive health examination - Primary    Annual comprehensive preventive exam was done as well as an evaluation and management of chronic conditions .  During the course of the visit the patient was educated and counseled about appropriate screening and preventive services including :  diabetes screening, lipid analysis with projected  10 year  risk for CAD , nutrition counseling, breast, cervical and colorectal cancer screening, and  recommended immunizations.  Printed recommendations for health maintenance screenings was given      Essential hypertension    Well controlled on current regimen. Renal function stable, no changes today.  Lab Results  Component Value Date   CREATININE 1.4 (H) 08/06/2017   Lab Results  Component Value Date   NA 140 08/06/2017   K 4.9 08/06/2017   CL 103 08/05/2017   CO2 27 08/06/2017         Hyperlipidemia with target LDL less than 100    Her ten year risk of CAD is > 25% and she has aortic atherosclerosis on plain films.  Statin advised.   Lab Results  Component Value Date   CHOL 293 (H) 08/05/2017   HDL 51.80 08/05/2017   LDLDIRECT 200.0 08/05/2017  TRIG 239.0 (H) 08/05/2017   CHOLHDL 6 08/05/2017         Relevant Orders   LDL cholesterol, direct (Completed)   IgG monoclonal gammopathy   Malignant neoplasm of lower-outer quadrant of left breast of female, estrogen receptor positive (HCC)   Pain of right hip joint    ROM normal. Plain films normal.  Pain appears to be radiculopthy from spinal DDD suggested by plain films.       Relevant Orders   DG HIP UNILAT WITH PELVIS 2-3 VIEWS RIGHT (Completed)    Other Visit Diagnoses    Sciatica of right side       Relevant Orders   DG Lumbar Spine Complete (Completed)      I am having Ms. Frutoso Chase maintain her aspirin, CINNAMON PO, Misc Natural Products (NARCOSOFT HERBAL LAX PO), Co Q-10, Vitamin D-3, Flax Seed Oil, vitamin E, b complex vitamins, Krill Oil, Red Yeast Rice Extract (RED YEAST RICE PO), Magnesium, (Borage, Borago officinalis, (BORAGE OIL PO)), Tdap, RA B-COMPLEX/VITAMIN C CR, RA CALCIUM 600/VIT D/MINERALS, Indole-3-Carbinol, and Nutritional Supplements (NUTRITIONAL SUPPLEMENT PO).  No orders of the defined types were placed in this encounter.   There are no discontinued medications.  Follow-up: Return in about 6 months (around 02/02/2018).   Crecencio Mc, MD

## 2017-08-06 ENCOUNTER — Other Ambulatory Visit (HOSPITAL_BASED_OUTPATIENT_CLINIC_OR_DEPARTMENT_OTHER): Payer: Medicare HMO

## 2017-08-06 ENCOUNTER — Encounter: Payer: Self-pay | Admitting: Oncology

## 2017-08-06 ENCOUNTER — Ambulatory Visit (HOSPITAL_BASED_OUTPATIENT_CLINIC_OR_DEPARTMENT_OTHER): Payer: Medicare HMO | Admitting: Oncology

## 2017-08-06 VITALS — BP 132/54 | HR 80 | Temp 98.1°F | Resp 17 | Ht 67.0 in | Wt 169.5 lb

## 2017-08-06 DIAGNOSIS — Z7189 Other specified counseling: Secondary | ICD-10-CM | POA: Insufficient documentation

## 2017-08-06 DIAGNOSIS — Z170 Estrogen receptor positive status [ER+]: Secondary | ICD-10-CM

## 2017-08-06 DIAGNOSIS — D472 Monoclonal gammopathy: Secondary | ICD-10-CM | POA: Diagnosis not present

## 2017-08-06 DIAGNOSIS — C50512 Malignant neoplasm of lower-outer quadrant of left female breast: Secondary | ICD-10-CM

## 2017-08-06 DIAGNOSIS — Z0000 Encounter for general adult medical examination without abnormal findings: Principal | ICD-10-CM

## 2017-08-06 DIAGNOSIS — Z853 Personal history of malignant neoplasm of breast: Secondary | ICD-10-CM

## 2017-08-06 DIAGNOSIS — M25551 Pain in right hip: Secondary | ICD-10-CM | POA: Insufficient documentation

## 2017-08-06 LAB — CBC WITH DIFFERENTIAL/PLATELET
BASO%: 0.4 % (ref 0.0–2.0)
Basophils Absolute: 0.1 10*3/uL (ref 0.0–0.1)
EOS%: 1.7 % (ref 0.0–7.0)
Eosinophils Absolute: 0.2 10*3/uL (ref 0.0–0.5)
HCT: 44.1 % (ref 34.8–46.6)
HEMOGLOBIN: 14.7 g/dL (ref 11.6–15.9)
LYMPH#: 8.3 10*3/uL — AB (ref 0.9–3.3)
LYMPH%: 62.6 % — ABNORMAL HIGH (ref 14.0–49.7)
MCH: 30.3 pg (ref 25.1–34.0)
MCHC: 33.3 g/dL (ref 31.5–36.0)
MCV: 90.9 fL (ref 79.5–101.0)
MONO#: 0.4 10*3/uL (ref 0.1–0.9)
MONO%: 3.3 % (ref 0.0–14.0)
NEUT#: 4.3 10*3/uL (ref 1.5–6.5)
NEUT%: 32 % — ABNORMAL LOW (ref 38.4–76.8)
NRBC: 0 % (ref 0–0)
Platelets: 238 10*3/uL (ref 145–400)
RBC: 4.85 10*6/uL (ref 3.70–5.45)
RDW: 12.7 % (ref 11.2–14.5)
WBC: 13.3 10*3/uL — ABNORMAL HIGH (ref 3.9–10.3)

## 2017-08-06 LAB — COMPREHENSIVE METABOLIC PANEL
ALT: 43 U/L (ref 0–55)
AST: 47 U/L — ABNORMAL HIGH (ref 5–34)
Albumin: 4.2 g/dL (ref 3.5–5.0)
Alkaline Phosphatase: 59 U/L (ref 40–150)
Anion Gap: 9 mEq/L (ref 3–11)
BILIRUBIN TOTAL: 0.48 mg/dL (ref 0.20–1.20)
BUN: 37.3 mg/dL — ABNORMAL HIGH (ref 7.0–26.0)
CO2: 27 meq/L (ref 22–29)
Calcium: 10.4 mg/dL (ref 8.4–10.4)
Chloride: 105 mEq/L (ref 98–109)
Creatinine: 1.4 mg/dL — ABNORMAL HIGH (ref 0.6–1.1)
EGFR: 36 mL/min/{1.73_m2} — AB (ref >90)
GLUCOSE: 115 mg/dL (ref 70–140)
Potassium: 4.9 mEq/L (ref 3.5–5.1)
Sodium: 140 mEq/L (ref 136–145)
TOTAL PROTEIN: 7.6 g/dL (ref 6.4–8.3)

## 2017-08-06 LAB — TECHNOLOGIST REVIEW

## 2017-08-06 LAB — DRAW EXTRA CLOT TUBE

## 2017-08-06 NOTE — Assessment & Plan Note (Signed)
ROM normal. Plain films normal.  Pain appears to be radiculopthy from spinal DDD suggested by plain films.

## 2017-08-06 NOTE — Assessment & Plan Note (Signed)
Well controlled on current regimen. Renal function stable, no changes today.  Lab Results  Component Value Date   CREATININE 1.4 (H) 08/06/2017   Lab Results  Component Value Date   NA 140 08/06/2017   K 4.9 08/06/2017   CL 103 08/05/2017   CO2 27 08/06/2017

## 2017-08-06 NOTE — Assessment & Plan Note (Addendum)
Well controlled with diet alone. Continue  low glycemic index diet utilizing smaller more frequent meals to increase metabolism.  I have also recommended that patient start exercising with a goal of 30 minutes of aerobic exercise a minimum of 5 days per week.   Lab Results  Component Value Date   HGBA1C 6.6 (H) 08/05/2017   No results found for: Haley Love

## 2017-08-06 NOTE — Assessment & Plan Note (Signed)
Annual comprehensive preventive exam was done as well as an evaluation and management of chronic conditions .  During the course of the visit the patient was educated and counseled about appropriate screening and preventive services including :  diabetes screening, lipid analysis with projected  10 year  risk for CAD , nutrition counseling, breast, cervical and colorectal cancer screening, and recommended immunizations.  Printed recommendations for health maintenance screenings was given 

## 2017-08-06 NOTE — Assessment & Plan Note (Signed)
Stable.  referral to  Nephrology was done last year and labs reviewed.  Reminded to avoid daily use of aleve and other NSAIDs .  Continue lisinopril. Lab Results  Component Value Date   CREATININE 1.4 (H) 08/06/2017

## 2017-08-06 NOTE — Assessment & Plan Note (Addendum)
Her ten year risk of CAD is > 25% and she has aortic atherosclerosis on plain films.  Statin advised.   Lab Results  Component Value Date   CHOL 293 (H) 08/05/2017   HDL 51.80 08/05/2017   LDLDIRECT 200.0 08/05/2017   TRIG 239.0 (H) 08/05/2017   CHOLHDL 6 08/05/2017
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
## 2017-08-07 LAB — PROTEIN ELECTROPHORESIS, SERUM
A/G RATIO SPE: 1.1 (ref 0.7–1.7)
Albumin: 3.7 g/dL (ref 2.9–4.4)
Alpha 1: 0.2 g/dL (ref 0.0–0.4)
Alpha 2: 0.9 g/dL (ref 0.4–1.0)
BETA: 1.3 g/dL (ref 0.7–1.3)
GLOBULIN, TOTAL: 3.3 g/dL (ref 2.2–3.9)
Gamma Globulin: 1 g/dL (ref 0.4–1.8)
M-SPIKE, %: 0.4 g/dL — AB
Total Protein: 7 g/dL (ref 6.0–8.5)

## 2017-08-07 LAB — KAPPA/LAMBDA LIGHT CHAINS
Ig Kappa Free Light Chain: 17.1 mg/L (ref 3.3–19.4)
Ig Lambda Free Light Chain: 15 mg/L (ref 5.7–26.3)
KAPPA/LAMBDA FLC RATIO: 1.14 (ref 0.26–1.65)

## 2017-08-09 ENCOUNTER — Encounter: Payer: Self-pay | Admitting: Internal Medicine

## 2017-08-09 DIAGNOSIS — T466X5A Adverse effect of antihyperlipidemic and antiarteriosclerotic drugs, initial encounter: Secondary | ICD-10-CM | POA: Insufficient documentation

## 2017-08-09 DIAGNOSIS — Z789 Other specified health status: Secondary | ICD-10-CM | POA: Insufficient documentation

## 2017-08-10 ENCOUNTER — Other Ambulatory Visit: Payer: Self-pay | Admitting: Oncology

## 2017-08-10 LAB — COMPREHENSIVE METABOLIC PANEL
AG Ratio: 1.7 (calc) (ref 1.0–2.5)
ALT: 36 U/L — AB (ref 6–29)
AST: 37 U/L — ABNORMAL HIGH (ref 10–35)
Albumin: 4.5 g/dL (ref 3.6–5.1)
Alkaline phosphatase (APISO): 52 U/L (ref 33–130)
BUN / CREAT RATIO: 28 (calc) — AB (ref 6–22)
BUN: 35 mg/dL — AB (ref 7–25)
CO2: 27 mmol/L (ref 20–32)
CREATININE: 1.26 mg/dL — AB (ref 0.60–0.88)
Calcium: 9.9 mg/dL (ref 8.6–10.4)
Chloride: 103 mmol/L (ref 98–110)
GLUCOSE: 121 mg/dL — AB (ref 65–99)
Globulin: 2.7 g/dL (calc) (ref 1.9–3.7)
Potassium: 4.7 mmol/L (ref 3.5–5.3)
SODIUM: 138 mmol/L (ref 135–146)
TOTAL PROTEIN: 7.2 g/dL (ref 6.1–8.1)
Total Bilirubin: 0.5 mg/dL (ref 0.2–1.2)

## 2017-08-10 LAB — PROTEIN ELECTROPHORESIS, SERUM
ALBUMIN ELP: 4.7 g/dL (ref 3.8–4.8)
ALPHA 1: 0.2 g/dL (ref 0.2–0.3)
Abnormal Protein Band1: 0.5 g/dL — ABNORMAL HIGH
Alpha 2: 0.9 g/dL (ref 0.5–0.9)
BETA 2: 0.4 g/dL (ref 0.2–0.5)
Beta Globulin: 0.5 g/dL (ref 0.4–0.6)
Gamma Globulin: 0.9 g/dL (ref 0.8–1.7)
TOTAL PROTEIN: 7.6 g/dL (ref 6.1–8.1)

## 2017-08-10 LAB — KAPPA/LAMBDA LIGHT CHAINS
KAPPA LAMBDA RATIO: 1.22 (ref 0.26–1.65)
Kappa free light chain: 16.9 mg/L (ref 3.3–19.4)
LAMBDA FREE LGHT CHN: 13.9 mg/L (ref 5.7–26.3)

## 2017-08-19 NOTE — Telephone Encounter (Signed)
Error

## 2017-08-25 ENCOUNTER — Telehealth: Payer: Self-pay | Admitting: *Deleted

## 2017-08-25 NOTE — Telephone Encounter (Signed)
Pt requested a copy of her latest office notes for her physical and the Xray of her hip and leg.  Please mail to her [address] [place]

## 2017-08-25 NOTE — Telephone Encounter (Signed)
Printed and mailed per pt request

## 2017-09-02 DIAGNOSIS — D0462 Carcinoma in situ of skin of left upper limb, including shoulder: Secondary | ICD-10-CM | POA: Diagnosis not present

## 2017-09-12 ENCOUNTER — Other Ambulatory Visit: Payer: Self-pay | Admitting: Internal Medicine

## 2017-10-02 DIAGNOSIS — L820 Inflamed seborrheic keratosis: Secondary | ICD-10-CM | POA: Diagnosis not present

## 2017-10-02 DIAGNOSIS — L298 Other pruritus: Secondary | ICD-10-CM | POA: Diagnosis not present

## 2017-10-02 DIAGNOSIS — L821 Other seborrheic keratosis: Secondary | ICD-10-CM | POA: Diagnosis not present

## 2017-10-02 DIAGNOSIS — L538 Other specified erythematous conditions: Secondary | ICD-10-CM | POA: Diagnosis not present

## 2017-12-02 ENCOUNTER — Other Ambulatory Visit: Payer: Self-pay | Admitting: Internal Medicine

## 2018-01-29 ENCOUNTER — Ambulatory Visit (INDEPENDENT_AMBULATORY_CARE_PROVIDER_SITE_OTHER): Payer: Medicare HMO

## 2018-01-29 VITALS — BP 108/62 | HR 79 | Temp 98.0°F | Resp 14 | Ht 67.0 in | Wt 178.0 lb

## 2018-01-29 DIAGNOSIS — N183 Chronic kidney disease, stage 3 unspecified: Secondary | ICD-10-CM

## 2018-01-29 DIAGNOSIS — I10 Essential (primary) hypertension: Secondary | ICD-10-CM

## 2018-01-29 DIAGNOSIS — E119 Type 2 diabetes mellitus without complications: Secondary | ICD-10-CM | POA: Diagnosis not present

## 2018-01-29 DIAGNOSIS — E785 Hyperlipidemia, unspecified: Secondary | ICD-10-CM | POA: Diagnosis not present

## 2018-01-29 DIAGNOSIS — Z0000 Encounter for general adult medical examination without abnormal findings: Secondary | ICD-10-CM | POA: Diagnosis not present

## 2018-01-29 NOTE — Patient Instructions (Addendum)
  Haley Love , Thank you for taking time to come for your Medicare Wellness Visit. I appreciate your ongoing commitment to your health goals. Please review the following plan we discussed and let me know if I can assist you in the future.   Fasting labs on Monday!  Follow up with Dr. Derrel Nip as needed.    Bring a copy of your [place] and/or Living Will to be scanned into chart.  Have a great day!  These are the goals we discussed: Goals    . DIET - INCREASE WATER INTAKE     Stay hydrated    . Increase physical activity     Walk for exercise 5 days weekly, 30 minutes       This is a list of the screening recommended for you and due dates:  Health Maintenance  Topic Date Due  . Complete foot exam   11/21/1944  . Eye exam for diabetics  11/21/1944  . Pneumonia vaccines (1 of 2 - PCV13) 11/21/2001  . Flu Shot  08/05/2018*  . Hemoglobin A1C  02/02/2018  . Tetanus Vaccine  05/20/2027  . DEXA scan (bone density measurement)  Completed  *Topic was postponed. The date shown is not the original due date.

## 2018-01-29 NOTE — Progress Notes (Addendum)
Subjective:   Haley Love is a 84 y.o. female who presents for Medicare Annual (Subsequent) preventive examination.  Review of Systems:  No ROS.  Medicare Wellness Visit. Additional risk factors are reflected in the social history.  Cardiac Risk Factors include: advanced age (>68men, >44 women);hypertension;diabetes mellitus     Objective:     Vitals: BP 108/62 (BP Location: Right Arm, Patient Position: Sitting, Cuff Size: Normal)   Pulse 79   Temp 98 F (36.7 C) (Oral)   Resp 14   Ht 5\' 7"  (1.702 m)   Wt 178 lb (80.7 kg)   SpO2 98%   BMI 27.88 kg/m   Body mass index is 27.88 kg/m.  Advanced Directives 01/29/2018 01/28/2017 07/31/2015 02/19/2015 08/06/2012 08/02/2012 07/21/2012  Does Patient Have a Medical Advance Directive? Yes Yes Yes Yes - Patient has advance directive, copy not in chart Patient has advance directive, copy not in chart  Type of Advance Directive [place];Living will [place];Living will - - - - Press photographer;Living will  Does patient want to make changes to medical advance directive? No - Patient declined No - Patient declined - - - - -  Copy of [place] in Chart? No - copy requested No - copy requested - - - - -  Pre-existing out of facility DNR order (yellow form or pink MOST form) - - - - No - -    Tobacco Social History   Tobacco Use  Smoking Status Never Smoker  Smokeless Tobacco Never Used     Counseling given: Not Answered   Clinical Intake:  Pre-visit preparation completed: Yes  Pain : No/denies pain     Nutritional Status: BMI 25 -29 Overweight Diabetes: Yes(Followed by PCP)  How often do you need to have someone help you when you read instructions, pamphlets, or other written materials from your doctor or pharmacy?: 1 - Never  Interpreter Needed?: No     Past Medical History:  Diagnosis Date  . Breast cancer ([place])   . Cataract   . Chickenpox   . Contact  lens/glasses fitting   . Diabetes mellitus without complication ([place])   . Hyperlipidemia   . Hypertension   . Inflammatory polyps of colon [facility])    Past Surgical History:  Procedure Laterality Date  . BREAST LUMPECTOMY Left 07/28/2012  . BREAST SURGERY  9/13   lt lump  . cataracts  2014  . COLONOSCOPY WITH PROPOFOL N/A 01/05/2017   Procedure: COLONOSCOPY WITH PROPOFOL;  Surgeon: Manya Silvas, MD;  Location: [facility] ENDOSCOPY;  Service: Endoscopy;  Laterality: N/A;  . JOINT REPLACEMENT Right July 2015   [place], right knee  . KNEE ARTHROSCOPY W/ MENISCAL REPAIR  09/23/11   Right  . MASTECTOMY, PARTIAL  08/06/2012   Procedure: MASTECTOMY PARTIAL;  Surgeon: Adin Hector, MD;  Location: [place];  Service: General;  Laterality: Left;  Left partial mastectomy with re-excision of margins   Family History  Problem Relation Age of Onset  . Lung cancer Mother   . Arthritis Mother   . Hyperlipidemia Mother   . Hypertension Mother   . Throat cancer Father   . Hyperlipidemia Father   . Cancer Paternal Aunt        breast  . Dementia Brother   . Leukemia Brother        lymphatic    Social History   Socioeconomic History  . Marital status: Divorced  Spouse name: None  . Number of children: None  . Years of education: None  . Highest education level: None  Social Needs  . Financial resource strain: Not hard at all  . Food insecurity - worry: Never true  . Food insecurity - inability: Never true  . Transportation needs - medical: No  . Transportation needs - non-medical: No  Occupational History  . None  Tobacco Use  . Smoking status: Never Smoker  . Smokeless tobacco: Never Used  Substance and Sexual Activity  . Alcohol use: Yes    Comment: 1 glass wine week  . Drug use: No  . Sexual activity: No    Birth control/protection: Post-menopausal  Other Topics Concern  . None  Social History Narrative   Lives alone ,      Outpatient Encounter  Medications as of 01/29/2018  Medication Sig  . aspirin 81 MG tablet Take 81 mg by mouth daily.  Marland Kitchen b complex vitamins tablet Take 1 tablet by mouth daily.  . B Complex-C (RA B-COMPLEX/VITAMIN C CR) TBCR Take by mouth.  Chong Sicilian, Borago officinalis, (BORAGE OIL PO) Take 1,000 mg by mouth daily.  . Calcium Carbonate-Vit D-Min (RA CALCIUM 600/VIT D/MINERALS) 600-200 MG-UNIT TABS Take tablets by mouth once a day.  . Cholecalciferol (VITAMIN D-3) 1000 UNITS CAPS Take 1 capsule by mouth at bedtime.  Marland Kitchen CINNAMON PO Take 2,000 mg by mouth at bedtime.  . Coenzyme Q10 (CO Q-10) 200 MG CAPS Take 1 capsule by mouth at bedtime.  . Flaxseed, Linseed, (FLAX SEED OIL) 1000 MG CAPS Take by mouth.  . Indole-3-Carbinol POWD Take 200 mg by mouth 2 (two) times daily.  Javier Docker Oil 1500 MG CAPS Take by mouth.  Marland Kitchen lisinopril-hydrochlorothiazide (PRINZIDE,ZESTORETIC) 10-12.5 MG tablet TAKE 1 TABLET BY MOUTH EVERY DAY  . Magnesium 500 MG CAPS Take 1 capsule by mouth daily.   . Misc Natural Products (NARCOSOFT HERBAL LAX PO) Take 2 tablets by mouth at bedtime.   . Nutritional Supplements (NUTRITIONAL SUPPLEMENT PO) Take 3 capsules by mouth 2 (two) times daily. Neprinol AFD>  . Red Yeast Rice Extract (RED YEAST RICE PO) Take 1,000 mg by mouth 2 (two) times daily.  . vitamin E 400 UNIT capsule Take 400 Units by mouth daily.  . TDaP (BOOSTRIX) 5-2.5-18.5 LF-MCG/0.5 injection Inject 0.5 mLs into the muscle once.   No facility-administered encounter medications on file as of 01/29/2018.     Activities of Daily Living In your present state of health, do you have any difficulty performing the following activities: 01/29/2018  Hearing? N  Vision? N  Difficulty concentrating or making decisions? N  Walking or climbing stairs? N  Dressing or bathing? N  Doing errands, shopping? N  Preparing Food and eating ? N  Using the Toilet? N  In the past six months, have you accidently leaked urine? N  Do you have problems with loss of  bowel control? N  Managing your Medications? N  Managing your Finances? N  Housekeeping or managing your Housekeeping? N  Some recent data might be hidden    Patient Care Team: Crecencio Mc, MD as PCP - General (Internal Medicine)    Assessment:   This is a routine wellness examination for Deniya.  The goal of the wellness visit is to assist the patient how to close the gaps in care and create a preventative care plan for the patient.   The roster of all physicians providing medical care to patient is listed  in the Snapshot section of the chart.  Taking calcium VIT D as appropriate/Osteoporosis risk reviewed.    Safety issues reviewed; Smoke and carbon monoxide detectors in the home. No firearms or firearms locked in a safe within the home. Wears seatbelts when driving or riding with others. No violence in the home.  They do not have excessive sun exposure.  Discussed the need for sun protection: hats, long sleeves and the use of sunscreen if there is significant sun exposure.  Patient is alert, normal appearance, oriented to person/place/and time. Correctly identified the president of the [place] and recalls of 3/3 words.Performs simple calculations and can read correct time from watch face. Displays appropriate judgement.  No new identified risk were noted.  No failures at ADL's or IADL's.    BMI- discussed the importance of a healthy diet, water intake and the benefits of aerobic exercise. Educational material provided.   24 hour diet recall: Low carb/low cholesterol diet  Daily fluid intake: 1-2 cups of caffeine, 2 cups of water  Dental- every 12 months.  Eye- Visual acuity not assessed per patient preference since they have regular follow up with the ophthalmologist.  Wears corrective lenses.  Sleep patterns- Sleeps 6-8 hours at night.  Wakes feeling rested.   Patient Concerns: None at this time. Follow up with PCP as needed.  Exercise Activities and Dietary  recommendations Current Exercise Habits: Home exercise routine, Type of exercise: walking, Time (Minutes): 20, Frequency (Times/Week): 5, Weekly Exercise (Minutes/Week): 100, Intensity: Mild  Goals    . DIET - INCREASE WATER INTAKE     Stay hydrated    . Increase physical activity     Walk for exercise 5 days weekly, 30 minutes       Fall Risk Fall Risk  01/29/2018 01/28/2017 08/29/2015 09/02/2013  Falls in the past year? No No No No   Depression Screen PHQ 2/9 Scores 01/29/2018 01/28/2017 08/29/2015 09/02/2013  PHQ - 2 Score 0 0 0 0     Cognitive Function MMSE - Mini Mental State Exam 01/28/2017  Orientation to time 5  Orientation to Place 5  Registration 3  Attention/ Calculation 5  Recall 3  Language- name 2 objects 2  Language- repeat 1  Language- follow 3 step command 3  Language- read & follow direction 1  Write a sentence 1  Copy design 1  Total score 30     6CIT Screen 01/29/2018  What Year? 0 points  What month? 0 points  What time? 0 points  Count back from 20 0 points  Months in reverse 0 points  Repeat phrase 0 points  Total Score 0    Immunization History  Administered Date(s) Administered  . Tdap 05/19/2017  . Zoster 12/22/2011    Screening Tests Health Maintenance  Topic Date Due  . FOOT EXAM  11/21/1944  . OPHTHALMOLOGY EXAM  11/21/1944  . PNA vac Low Risk Adult (1 of 2 - PCV13) 11/21/2001  . INFLUENZA VACCINE  08/05/2018 (Originally 06/24/2017)  . HEMOGLOBIN A1C  02/02/2018  . TETANUS/TDAP  05/20/2027  . DEXA SCAN  Completed       Plan:   End of life planning; Advance aging; Advanced directives discussed. Copy of current HCPOA/Living Will requested.    Fasting labs ordered per patient request.    Return on next Wednesday for follow up with PCP.  I have personally reviewed and noted the following in the patient's chart:   . Medical and social history . Use of alcohol,  tobacco or illicit drugs  . Current medications and  supplements . Functional ability and status . Nutritional status . Physical activity . Advanced directives . List of other physicians . Hospitalizations, surgeries, and ER visits in previous 12 months . Vitals . Screenings to include cognitive, depression, and falls . Referrals and appointments  In addition, I have reviewed and discussed with patient certain preventive protocols, quality metrics, and best practice recommendations. A written personalized care plan for preventive services as well as general preventive health recommendations were provided to patient.     OBrien-Blaney, Sanaiya Welliver L, LPN  01/25/1959    I have reviewed the above information and agree with above.   Deborra Medina, MD

## 2018-02-01 ENCOUNTER — Other Ambulatory Visit (INDEPENDENT_AMBULATORY_CARE_PROVIDER_SITE_OTHER): Payer: Medicare HMO

## 2018-02-01 ENCOUNTER — Telehealth: Payer: Self-pay | Admitting: *Deleted

## 2018-02-01 ENCOUNTER — Other Ambulatory Visit: Payer: Medicare HMO

## 2018-02-01 DIAGNOSIS — E119 Type 2 diabetes mellitus without complications: Secondary | ICD-10-CM | POA: Diagnosis not present

## 2018-02-01 DIAGNOSIS — I10 Essential (primary) hypertension: Secondary | ICD-10-CM | POA: Diagnosis not present

## 2018-02-01 DIAGNOSIS — N183 Chronic kidney disease, stage 3 unspecified: Secondary | ICD-10-CM

## 2018-02-01 DIAGNOSIS — E785 Hyperlipidemia, unspecified: Secondary | ICD-10-CM | POA: Diagnosis not present

## 2018-02-01 DIAGNOSIS — R799 Abnormal finding of blood chemistry, unspecified: Secondary | ICD-10-CM

## 2018-02-01 DIAGNOSIS — D729 Disorder of white blood cells, unspecified: Secondary | ICD-10-CM | POA: Diagnosis not present

## 2018-02-01 LAB — CBC WITH DIFFERENTIAL/PLATELET
BASOS PCT: 0.7 % (ref 0.0–3.0)
Basophils Absolute: 0.1 10*3/uL (ref 0.0–0.1)
EOS PCT: 1.6 % (ref 0.0–5.0)
Eosinophils Absolute: 0.3 10*3/uL (ref 0.0–0.7)
HCT: 42.8 % (ref 36.0–46.0)
Hemoglobin: 14.4 g/dL (ref 12.0–15.0)
Lymphs Abs: 13.6 10*3/uL — ABNORMAL HIGH (ref 0.7–4.0)
MCHC: 33.6 g/dL (ref 30.0–36.0)
MCV: 89.6 fl (ref 78.0–100.0)
MONO ABS: 0.5 10*3/uL (ref 0.1–1.0)
Monocytes Relative: 2.7 % — ABNORMAL LOW (ref 3.0–12.0)
Neutro Abs: 3.6 10*3/uL (ref 1.4–7.7)
Neutrophils Relative %: 19.8 % — ABNORMAL LOW (ref 43.0–77.0)
Platelets: 242 10*3/uL (ref 150.0–400.0)
RBC: 4.78 Mil/uL (ref 3.87–5.11)
RDW: 12.5 % (ref 11.5–15.5)

## 2018-02-01 LAB — COMPREHENSIVE METABOLIC PANEL
ALBUMIN: 4.1 g/dL (ref 3.5–5.2)
ALK PHOS: 44 U/L (ref 39–117)
ALT: 35 U/L (ref 0–35)
AST: 27 U/L (ref 0–37)
BILIRUBIN TOTAL: 0.5 mg/dL (ref 0.2–1.2)
BUN: 25 mg/dL — ABNORMAL HIGH (ref 6–23)
CALCIUM: 10.2 mg/dL (ref 8.4–10.5)
CO2: 29 mEq/L (ref 19–32)
CREATININE: 1.13 mg/dL (ref 0.40–1.20)
Chloride: 104 mEq/L (ref 96–112)
GFR: 49.09 mL/min — AB (ref >60.00)
Glucose, Bld: 130 mg/dL — ABNORMAL HIGH (ref 70–99)
Potassium: 4.6 mEq/L (ref 3.5–5.1)
Sodium: 139 mEq/L (ref 135–145)
Total Protein: 6.9 g/dL (ref 6.0–8.3)

## 2018-02-01 LAB — LIPID PANEL
CHOLESTEROL: 193 mg/dL (ref 0–200)
HDL: 46.2 mg/dL (ref >39.00)
LDL Cholesterol: 109 mg/dL — ABNORMAL HIGH (ref 0–99)
NonHDL: 147.06
TRIGLYCERIDES: 190 mg/dL — AB (ref 0.0–149.0)
Total CHOL/HDL Ratio: 4
VLDL: 38 mg/dL (ref 0.0–40.0)

## 2018-02-01 LAB — HEMOGLOBIN A1C: HEMOGLOBIN A1C: 6.7 % — AB (ref 4.6–6.5)

## 2018-02-01 LAB — TSH: TSH: 1.44 u[IU]/mL (ref 0.35–4.50)

## 2018-02-01 NOTE — Telephone Encounter (Signed)
CRITICAL VALUE STICKER  CRITICAL VALUE: White Count-18.1 RECEIVER (on-site recipient of call): Jari Favre, CMA  DATE & TIME NOTIFIED: 02/01/18 @ 12:30pm  MESSENGER (representative from lab): Santiago Glad @ [facility]  MD NOTIFIED: Dr. Derrel Nip  TIME OF NOTIFICATION: 13:31pm  RESPONSE: see note

## 2018-02-01 NOTE — Telephone Encounter (Signed)
Patient has appt with me on Wednesday.

## 2018-02-02 LAB — PATHOLOGIST SMEAR REVIEW

## 2018-02-03 ENCOUNTER — Ambulatory Visit (INDEPENDENT_AMBULATORY_CARE_PROVIDER_SITE_OTHER): Payer: Medicare HMO | Admitting: Internal Medicine

## 2018-02-03 ENCOUNTER — Encounter: Payer: Self-pay | Admitting: Internal Medicine

## 2018-02-03 VITALS — BP 142/68 | HR 82 | Temp 97.8°F | Resp 15 | Ht 67.0 in | Wt 176.4 lb

## 2018-02-03 DIAGNOSIS — C9190 Lymphoid leukemia, unspecified not having achieved remission: Secondary | ICD-10-CM | POA: Diagnosis not present

## 2018-02-03 DIAGNOSIS — D126 Benign neoplasm of colon, unspecified: Secondary | ICD-10-CM | POA: Diagnosis not present

## 2018-02-03 DIAGNOSIS — N183 Chronic kidney disease, stage 3 unspecified: Secondary | ICD-10-CM

## 2018-02-03 DIAGNOSIS — D72820 Lymphocytosis (symptomatic): Secondary | ICD-10-CM

## 2018-02-03 DIAGNOSIS — E785 Hyperlipidemia, unspecified: Secondary | ICD-10-CM

## 2018-02-03 DIAGNOSIS — E119 Type 2 diabetes mellitus without complications: Secondary | ICD-10-CM

## 2018-02-03 DIAGNOSIS — D472 Monoclonal gammopathy: Secondary | ICD-10-CM

## 2018-02-03 DIAGNOSIS — C9110 Chronic lymphocytic leukemia of B-cell type not having achieved remission: Secondary | ICD-10-CM

## 2018-02-03 DIAGNOSIS — H6121 Impacted cerumen, right ear: Secondary | ICD-10-CM

## 2018-02-03 NOTE — Assessment & Plan Note (Signed)
Patient prefers to try Debrox at home and if unable to eliminate cerumen she has been av=dvised to return ofr RN visit to irrigate right ear

## 2018-02-03 NOTE — Progress Notes (Signed)
Subjective:  Patient ID: Haley Love, female    DOB: 1934/10/20  Age: 84 y.o. MRN: [id number]  CC: The primary encounter diagnosis was CLL (chronic lymphocytic leukemia) ([place]). Diagnoses of MGUS (monoclonal gammopathy of unknown significance), Diabetes mellitus type 2, diet-controlled ([place]), CKD (chronic kidney disease), stage III ([place]), Hyperlipidemia with target LDL less than 100, Hearing loss due to cerumen impaction, right, Atypical lymphocytosis, Tubular adenoma of colon, and IgG monoclonal gammopathy were also pertinent to this visit.  HPI PALIN TRISTAN presents for follow up on recent AWV with abnormal labs  Patient has a history of chronically elevated WBC presumed due to possible CLL, not pathologically confirmed, and MGUS documented in 2015. Both disorders have been acknowledged,  evaluated and followed by Dr [name]. She has a  history of  left sided invasive ductal CA Grade 1  breast cancer, treated with lumpectomy  alone in 2013 secondary to intolerance of anti estrogen therapy .   Last  hematology visit was in September.   WBC  Had been stable at 13K .  breast exam was done and considered unchanged/normal and mammogram was negative for recurrence in August 2018.  She was released from Dr Magrinat's care due to non recurrence and stability of hematologic issues with the following recommendations   "As far as the CLL and MGUS is concerned all she needs is a CBC with differential yearly and an SPEP yearly.  If she develops unexplained anemia or thrombocytopenia, or any significant "E" symptoms (unexplained weight loss, unexplained fatigue, fever, drenching sweats, adenopathy) of course we would be glad to see her again. Similarly if her white cell count (.pulse) within a year or her M spike progressively (grows to )greater than a graham, (sic) we would be glad to see her again"   Patient inadvertently had CBC done last week along with fasting labs and A1c and CBC was reported as a  critical abnormal.  Path smear showed absolute lymphocytosis and atypical lymphocytes , unremarkable RBCs and platelets.    From a hematologic standpoint, she remains asymptomatic, other than fatigue which is mild and does not limit her activities.  In the interim since ehr last visit, she informs me that her 68 yr old brother has been diagnosed with CLL but has not been offered treatment yet  Labs reviewed in detail with patient along with potential diagnosis of CLL and MGUS.     Outpatient Medications Prior to Visit  Medication Sig Dispense Refill  . aspirin 81 MG tablet Take 81 mg by mouth daily.    Marland Kitchen b complex vitamins tablet Take 1 tablet by mouth daily.    . B Complex-C (RA B-COMPLEX/VITAMIN C CR) TBCR Take by mouth.    Chong Sicilian, Borago officinalis, (BORAGE OIL PO) Take 1,000 mg by mouth daily.    . Calcium Carbonate-Vit D-Min (RA CALCIUM 600/VIT D/MINERALS) 600-200 MG-UNIT TABS Take tablets by mouth once a day.    . Cholecalciferol (VITAMIN D-3) 1000 UNITS CAPS Take 1 capsule by mouth at bedtime.    Marland Kitchen CINNAMON PO Take 2,000 mg by mouth at bedtime.    . Coenzyme Q10 (CO Q-10) 200 MG CAPS Take 1 capsule by mouth at bedtime.    . Flaxseed, Linseed, (FLAX SEED OIL) 1000 MG CAPS Take by mouth.    . Indole-3-Carbinol POWD Take 200 mg by mouth 2 (two) times daily.    Javier Docker Oil 1500 MG CAPS Take by mouth.    Marland Kitchen lisinopril-hydrochlorothiazide (PRINZIDE,ZESTORETIC) 10-12.5 MG  tablet TAKE 1 TABLET BY MOUTH EVERY DAY 90 tablet 1  . Magnesium 500 MG CAPS Take 1 capsule by mouth daily.     . Misc Natural Products (NARCOSOFT HERBAL LAX PO) Take 2 tablets by mouth at bedtime.     . Nutritional Supplements (NUTRITIONAL SUPPLEMENT PO) Take 3 capsules by mouth 2 (two) times daily. Neprinol AFD>    . Red Yeast Rice Extract (RED YEAST RICE PO) Take 1,000 mg by mouth 2 (two) times daily.    . TDaP (BOOSTRIX) 5-2.5-18.5 LF-MCG/0.5 injection Inject 0.5 mLs into the muscle once. 0.5 mL 0  . vitamin E 400  UNIT capsule Take 400 Units by mouth daily.     No facility-administered medications prior to visit.     Review of Systems;  Patient denies headache, fevers, malaise, unintentional weight loss, skin rash, eye pain, sinus congestion and sinus pain, sore throat, dysphagia,  hemoptysis , cough, dyspnea, wheezing, chest pain, palpitations, orthopnea, edema, abdominal pain, nausea, melena, diarrhea, constipation, flank pain, dysuria, hematuria, urinary  Frequency, nocturia, numbness, tingling, seizures,  Focal weakness, Loss of consciousness,  Tremor, insomnia, depression, anxiety, and suicidal ideation.      Objective:  BP (!) 142/68 (BP Location: Right Arm, Patient Position: Sitting, Cuff Size: Normal)   Pulse 82   Temp 97.8 F (36.6 C) (Oral)   Resp 15   Ht 5\' 7"  (1.702 m)   Wt 176 lb 6.4 oz (80 kg)   SpO2 97%   BMI 27.63 kg/m   BP Readings from Last 3 Encounters:  02/03/18 (!) 142/68  01/29/18 108/62  08/06/17 (!) 132/54    Wt Readings from Last 3 Encounters:  02/03/18 176 lb 6.4 oz (80 kg)  01/29/18 178 lb (80.7 kg)  08/06/17 169 lb 8 oz (76.9 kg)    General appearance: alert, cooperative and appears stated age Ears: normal TM's and external ear canals both ears Throat: lips, mucosa, and tongue normal; teeth and gums normal Neck: no adenopathy, no carotid bruit, supple, symmetrical, trachea midline and thyroid not enlarged, symmetric, no tenderness/mass/nodules Back: symmetric, no curvature. ROM normal. No CVA tenderness. Lungs: clear to auscultation bilaterally Heart: regular rate and rhythm, S1, S2 normal, no murmur, click, rub or gallop Abdomen: soft, non-tender; bowel sounds normal; no masses,  no organomegaly Pulses: 2+ and symmetric Skin: Skin color, texture, turgor normal. No rashes or lesions Lymph nodes: Cervical, supraclavicular, and axillary nodes normal.  Lab Results  Component Value Date   HGBA1C 6.7 (H) 02/01/2018   HGBA1C 6.6 (H) 08/05/2017   HGBA1C  6.5 11/22/2015    Lab Results  Component Value Date   CREATININE 1.13 02/01/2018   CREATININE 1.4 (H) 08/06/2017   CREATININE 1.26 (H) 08/05/2017    Lab Results  Component Value Date   WBC 18.1 Repeated and verified X2. (HH) 02/01/2018   HGB 14.4 02/01/2018   HCT 42.8 02/01/2018   PLT 242.0 02/01/2018   GLUCOSE 130 (H) 02/01/2018   CHOL 193 02/01/2018   TRIG 190.0 (H) 02/01/2018   HDL 46.20 02/01/2018   LDLDIRECT 200.0 08/05/2017   LDLCALC 109 (H) 02/01/2018   ALT 35 02/01/2018   AST 27 02/01/2018   NA 139 02/01/2018   K 4.6 02/01/2018   CL 104 02/01/2018   CREATININE 1.13 02/01/2018   BUN 25 (H) 02/01/2018   CO2 29 02/01/2018   TSH 1.44 02/01/2018   INR 1.0 05/31/2014   HGBA1C 6.7 (H) 02/01/2018    Mm Diag Breast Tomo Bilateral  Result Date: 07/24/2017 CLINICAL DATA:  84 year old female presenting for annual bilateral mammogram. Status post left lumpectomy in 2013. EXAM: 2D DIGITAL DIAGNOSTIC BILATERAL MAMMOGRAM WITH CAD AND ADJUNCT TOMO COMPARISON:  Previous exam(s). ACR Breast Density Category a: The breast tissue is almost entirely fatty. FINDINGS: Stable postsurgical clips and scarring are demonstrated in the central left breast. No suspicious masses or calcifications are identified in either breast. Mammographic images were processed with CAD. IMPRESSION: No mammographic evidence of malignancy in either breast. Stable left breast post lumpectomy changes. RECOMMENDATION: Diagnostic mammogram is suggested in 1 year. (Code:[id number]-B-01Y) I have discussed the findings and recommendations with the patient. Results were also provided in writing at the conclusion of the visit. If applicable, a reminder letter will be sent to the patient regarding the next appointment. BI-RADS CATEGORY  2: Benign. Electronically Signed   By: Kristopher Oppenheim M.D.   On: 07/24/2017 11:32    Assessment & Plan:   Problem List Items Addressed This Visit    Atypical lymphocytosis    Asymptomatic,   Suspected early CLL.  Discussed the potential diagnosis with her in light of her brother's recent diagnosis of CLL>.  Advised of the symptoms to call with.  Otherwise Will recheck CBC in September       CKD (chronic kidney disease), stage III ([facility])    Stable.  referral to  Nephrology was done last year and labs reviewed.  Reminded to avoid daily use of aleve and other NSAIDs .  Continue lisinopril. Lab Results  Component Value Date   CREATININE 1.13 02/01/2018           Relevant Orders   Comprehensive metabolic panel   Diabetes mellitus type 2, diet-controlled ([place])    Well controlled with diet alone. Continue  low glycemic index diet utilizing smaller more frequent meals to increase metabolism.  I have also recommended that patient start exercising with a goal of 30 minutes of aerobic exercise a minimum of 5 days per week.   Lab Results  Component Value Date   HGBA1C 6.7 (H) 02/01/2018   No results found for: MICROALBUR, [id number]        Relevant Orders   Hemoglobin A1c   Hearing loss due to cerumen impaction, right    Patient prefers to try Debrox at home and if unable to eliminate cerumen she has been av=dvised to return ofr RN visit to irrigate right ear       Hyperlipidemia with target LDL less than 100    Her ten year risk of CAD is > 25% and she has aortic atherosclerosis on plain films.  Statin advised but not tolerated,  Using red yeast rice .   Lab Results  Component Value Date   CHOL 193 02/01/2018   HDL 46.20 02/01/2018   LDLCALC 109 (H) 02/01/2018   LDLDIRECT 200.0 08/05/2017   TRIG 190.0 (H) 02/01/2018   CHOLHDL 4 02/01/2018         Relevant Orders   Lipid panel   IgG monoclonal gammopathy    Continue annual surveillance in September       Tubular adenoma of colon     Found on 2018 colonoscopy .  Repeat in 5 years pending prognosis from other conditions        Other Visit Diagnoses    CLL (chronic lymphocytic leukemia) ([place])    -  Primary    Relevant Orders   CBC with Differential/Platelet   Pathologist smear review   MGUS (monoclonal gammopathy of  unknown significance)       Relevant Orders   Kappa/lambda light chains   Protein electrophoresis, serum    A total of 40 minutes was spent with patient more than half of which was spent in counseling patient on the above mentioned issues , reviewing and explaining recent labs and imaging studies done, and coordination of care.   I am having Pauletta Pickney. Frutoso Chase maintain her aspirin, CINNAMON PO, Misc Natural Products (NARCOSOFT HERBAL LAX PO), Co Q-10, Vitamin D-3, Flax Seed Oil, vitamin E, b complex vitamins, Krill Oil, Red Yeast Rice Extract (RED YEAST RICE PO), Magnesium, (Borage, Borago officinalis, (BORAGE OIL PO)), Tdap, RA B-COMPLEX/VITAMIN C CR, RA CALCIUM 600/VIT D/MINERALS, Indole-3-Carbinol, Nutritional Supplements (NUTRITIONAL SUPPLEMENT PO), and lisinopril-hydrochlorothiazide.  No orders of the defined types were placed in this encounter.   There are no discontinued medications.  Follow-up: Return in about 6 months (around 08/06/2018) for follow up diabetes, CLL,  MGUS .   Crecencio Mc, MD

## 2018-02-03 NOTE — Patient Instructions (Addendum)
Your diabetes remains under excellent control  On diet alone,  and your cholesterol has improved. . Please continue your current medications.  Your CBC  Has changed a little,  But not enough to worry about the CLL becoming active and needing treatment.  Dr Gus wants me to check your CBC and SPEP once a year,  So  return in 6 months for repeat blood tests and for  follow up on diabetes '  If you develop weight loss,  Severe fatigue,  Fever,  Night sweats  Or enlarged lymph nodes in your neck or under your arms,  Return to me immediately.     make sure you are seeing your eye doctor at least once a year for a dilated retina exam to monitor for diabetic retinopathy,. changes that can lead to blindness .

## 2018-02-04 NOTE — Assessment & Plan Note (Signed)
Continue annual surveillance in September

## 2018-02-04 NOTE — Assessment & Plan Note (Addendum)
Asymptomatic,  Suspected early CLL.  Discussed the potential diagnosis with her in light of her brother's recent diagnosis of CLL>.  Advised of the symptoms to call with.  Otherwise Will recheck CBC in September

## 2018-02-04 NOTE — Assessment & Plan Note (Signed)
Well controlled with diet alone. Continue  low glycemic index diet utilizing smaller more frequent meals to increase metabolism.  I have also recommended that patient start exercising with a goal of 30 minutes of aerobic exercise a minimum of 5 days per week.   Lab Results  Component Value Date   HGBA1C 6.7 (H) 02/01/2018   No results found for: Haley Love

## 2018-02-04 NOTE — Assessment & Plan Note (Signed)
Stable.  referral to  Nephrology was done last year and labs reviewed.  Reminded to avoid daily use of aleve and other NSAIDs .  Continue lisinopril. Lab Results  Component Value Date   CREATININE 1.13 02/01/2018

## 2018-02-04 NOTE — Assessment & Plan Note (Signed)
Her ten year risk of CAD is > 25% and she has aortic atherosclerosis on plain films.  Statin advised but not tolerated,  Using red yeast rice .   Lab Results  Component Value Date   CHOL 193 02/01/2018   HDL 46.20 02/01/2018   LDLCALC 109 (H) 02/01/2018   LDLDIRECT 200.0 08/05/2017   TRIG 190.0 (H) 02/01/2018   CHOLHDL 4 02/01/2018

## 2018-02-04 NOTE — Assessment & Plan Note (Addendum)
Found on 2018 colonoscopy .  Repeat in 5 years pending prognosis from other conditions

## 2018-03-09 ENCOUNTER — Telehealth: Payer: Self-pay

## 2018-03-09 NOTE — Telephone Encounter (Signed)
Received VM from pt in regards to she received packet and calls from [facility], requesting cheek swab, and doesn't know if should disregard or if we knew anything about this- she has checked with her PCP and they don't know anything either.  I checked with nurse navigators here and they don't know of anything regarding the cancer center and [facility].  Called pt back, left her VM and told her didn't know any reason for [facility] contacting her or related to our cancer center.  She has our contact info if she has any further questions.

## 2018-03-16 DIAGNOSIS — C44729 Squamous cell carcinoma of skin of left lower limb, including hip: Secondary | ICD-10-CM | POA: Diagnosis not present

## 2018-03-16 DIAGNOSIS — L570 Actinic keratosis: Secondary | ICD-10-CM | POA: Diagnosis not present

## 2018-03-16 DIAGNOSIS — L821 Other seborrheic keratosis: Secondary | ICD-10-CM | POA: Diagnosis not present

## 2018-03-16 DIAGNOSIS — L500 Allergic urticaria: Secondary | ICD-10-CM | POA: Diagnosis not present

## 2018-03-16 DIAGNOSIS — D485 Neoplasm of uncertain behavior of skin: Secondary | ICD-10-CM | POA: Diagnosis not present

## 2018-03-16 DIAGNOSIS — D0472 Carcinoma in situ of skin of left lower limb, including hip: Secondary | ICD-10-CM | POA: Diagnosis not present

## 2018-03-25 DIAGNOSIS — X32XXXA Exposure to sunlight, initial encounter: Secondary | ICD-10-CM | POA: Diagnosis not present

## 2018-03-25 DIAGNOSIS — L820 Inflamed seborrheic keratosis: Secondary | ICD-10-CM | POA: Diagnosis not present

## 2018-03-25 DIAGNOSIS — L298 Other pruritus: Secondary | ICD-10-CM | POA: Diagnosis not present

## 2018-03-25 DIAGNOSIS — C44719 Basal cell carcinoma of skin of left lower limb, including hip: Secondary | ICD-10-CM | POA: Diagnosis not present

## 2018-03-25 DIAGNOSIS — C44729 Squamous cell carcinoma of skin of left lower limb, including hip: Secondary | ICD-10-CM | POA: Diagnosis not present

## 2018-03-25 DIAGNOSIS — L538 Other specified erythematous conditions: Secondary | ICD-10-CM | POA: Diagnosis not present

## 2018-03-25 DIAGNOSIS — L570 Actinic keratosis: Secondary | ICD-10-CM | POA: Diagnosis not present

## 2018-03-30 ENCOUNTER — Ambulatory Visit: Payer: Self-pay | Admitting: *Deleted

## 2018-03-30 NOTE — Telephone Encounter (Signed)
I returned her call.    She is c/o right sided flank pain from that is a constant soreness that is worse with twisting or moving.   Especially bending over it grabs me.    This has been going on for several days. She is leaving town today for 2 weeks and was concerned about this pain.  I have referred her to the urgent care center because all the providers at [facility] are booked all week.   I feel she should be evaluated prior to leaving town.   She agreed so she is going to go to the urgent care center.     She denies any urinary symptoms.  Reason for Disposition . [1] MODERATE back pain (e.g., interferes with normal activities) AND [2] present > 3 days  Answer Assessment - Initial Assessment Questions 1. ONSET: "When did the pain begin?"      I'm having an ache in my right lower back.   It has been hurting for several days.   My right hip has been hurting with pain to my knee. 2. LOCATION: "Where does it hurt?" (upper, mid or lower back)     When I twist and bend it really hurts.   It's real sore.  Right side in the back in the flank area.    3. SEVERITY: "How bad is the pain?"  (e.g., Scale 1-10; mild, moderate, or severe)   - MILD (1-3): doesn't interfere with normal activities    - MODERATE (4-7): interferes with normal activities or awakens from sleep    - SEVERE (8-10): excruciating pain, unable to do any normal activities      It's worse at times.   I bend over to pick up something up and it grabs me. 4. PATTERN: "Is the pain constant?" (e.g., yes, no; constant, intermittent)      It's a sore ache with increased pain when I twist or move.     5. RADIATION: "Does the pain shoot into your legs or elsewhere?"     Right hip has been hurting me.   Dr. Derrel Nip said it was my back not in my hip.   I've had the hip pain for a while.  6. CAUSE:  "What do you think is causing the back pain?"      I don't know 7. BACK OVERUSE:  "Any recent lifting of heavy objects, strenuous work or  exercise?"     No.   Just doing my normal stuff. 8. MEDICATIONS: "What have you taken so far for the pain?" (e.g., nothing, acetaminophen, NSAIDS)     Yesterday I took 4 Aleve and it eased off for a little while but then it came back. 9. NEUROLOGIC SYMPTOMS: "Do you have any weakness, numbness, or problems with bowel/bladder control?"     None of the above probems. 10. OTHER SYMPTOMS: "Do you have any other symptoms?" (e.g., fever, abdominal pain, burning with urination, blood in urine)       No 11. PREGNANCY: "Is there any chance you are pregnant?" (e.g., yes, no; LMP)       Not asked  Protocols used: BACK PAIN-A-AH

## 2018-04-14 ENCOUNTER — Telehealth: Payer: Self-pay | Admitting: Internal Medicine

## 2018-04-14 NOTE — Telephone Encounter (Signed)
Called and left voicemail for patient to call office back to possibly be scheduled with Earlie Counts.

## 2018-04-14 NOTE — Telephone Encounter (Unsigned)
Copied from [facility] [id number]. Topic: Quick Communication - See Telephone Encounter >> Apr 14, 2018 11:44 AM Neva Seat wrote: Back aches and burning sharp pain also in hip and knees for a few weeks... Pt wants to see her PCP [place].  Please call pt to work her in.

## 2018-04-15 ENCOUNTER — Encounter: Payer: Self-pay | Admitting: Family

## 2018-04-15 ENCOUNTER — Ambulatory Visit (INDEPENDENT_AMBULATORY_CARE_PROVIDER_SITE_OTHER): Payer: Medicare HMO | Admitting: Family

## 2018-04-15 VITALS — BP 124/64 | HR 84 | Temp 97.7°F | Resp 16 | Wt 177.4 lb

## 2018-04-15 DIAGNOSIS — M5441 Lumbago with sciatica, right side: Secondary | ICD-10-CM

## 2018-04-15 DIAGNOSIS — M5442 Lumbago with sciatica, left side: Secondary | ICD-10-CM

## 2018-04-15 DIAGNOSIS — R05 Cough: Secondary | ICD-10-CM | POA: Diagnosis not present

## 2018-04-15 DIAGNOSIS — R059 Cough, unspecified: Secondary | ICD-10-CM

## 2018-04-15 NOTE — Patient Instructions (Addendum)
Please begin back exercises twice daily. Apply heating pad twice daily and as needed.  You can try OTC Salon Pas patches. Call if new/worsening pain or if not improved in 1 week. Go to ER if you develop increased weakness/numbness/or bowel or bladder incontinence.  Add claritin 10mg  once daily for cough/allergy symptoms. Please let us know if not improved in 1 week or symptoms worsen.   Back Exercises If you have pain in your back, do these exercises 2-3 times each day or as told by your doctor. When the pain goes away, do the exercises once each day, but repeat the steps more times for each exercise (do more repetitions). If you do not have pain in your back, do these exercises once each day or as told by your doctor. Exercises Single Knee to Chest  Do these steps 3-5 times in a row for each leg: 1. Lie on your back on a firm bed or the floor with your legs stretched out. 2. Bring one knee to your chest. 3. Hold your knee to your chest by grabbing your knee or thigh. 4. Pull on your knee until you feel a gentle stretch in your lower back. 5. Keep doing the stretch for 10-30 seconds. 6. Slowly let go of your leg and straighten it.  Pelvic Tilt  Do these steps 5-10 times in a row: 1. Lie on your back on a firm bed or the floor with your legs stretched out. 2. Bend your knees so they point up to the ceiling. Your feet should be flat on the floor. 3. Tighten your lower belly (abdomen) muscles to press your lower back against the floor. This will make your tailbone point up to the ceiling instead of pointing down to your feet or the floor. 4. Stay in this position for 5-10 seconds while you gently tighten your muscles and breathe evenly.  Cat-Cow  Do these steps until your lower back bends more easily: 1. Get on your hands and knees on a firm surface. Keep your hands under your shoulders, and keep your knees under your hips. You may put padding under your knees. 2. Let your head hang  down, and make your tailbone point down to the floor so your lower back is round like the back of a cat. 3. Stay in this position for 5 seconds. 4. Slowly lift your head and make your tailbone point up to the ceiling so your back hangs low (sags) like the back of a cow. 5. Stay in this position for 5 seconds.  Press-Ups  Do these steps 5-10 times in a row: 1. Lie on your belly (face-down) on the floor. 2. Place your hands near your head, about shoulder-width apart. 3. While you keep your back relaxed and keep your hips on the floor, slowly straighten your arms to raise the top half of your body and lift your shoulders. Do not use your back muscles. To make yourself more comfortable, you may change where you place your hands. 4. Stay in this position for 5 seconds. 5. Slowly return to lying flat on the floor.  Bridges  Do these steps 10 times in a row: 1. Lie on your back on a firm surface. 2. Bend your knees so they point up to the ceiling. Your feet should be flat on the floor. 3. Tighten your butt muscles and lift your butt off of the floor until your waist is almost as high as your knees. If you do not feel the  muscles working in your butt and the back of your thighs, slide your feet 1-2 inches farther away from your butt. 4. Stay in this position for 3-5 seconds. 5. Slowly lower your butt to the floor, and let your butt muscles relax.  If this exercise is too easy, try doing it with your arms crossed over your chest. Belly Crunches  Do these steps 5-10 times in a row: 1. Lie on your back on a firm bed or the floor with your legs stretched out. 2. Bend your knees so they point up to the ceiling. Your feet should be flat on the floor. 3. Cross your arms over your chest. 4. Tip your chin a little bit toward your chest but do not bend your neck. 5. Tighten your belly muscles and slowly raise your chest just enough to lift your shoulder blades a tiny bit off of the floor. 6. Slowly  lower your chest and your head to the floor.  Back Lifts Do these steps 5-10 times in a row: 1. Lie on your belly (face-down) with your arms at your sides, and rest your forehead on the floor. 2. Tighten the muscles in your legs and your butt. 3. Slowly lift your chest off of the floor while you keep your hips on the floor. Keep the back of your head in line with the curve in your back. Look at the floor while you do this. 4. Stay in this position for 3-5 seconds. 5. Slowly lower your chest and your face to the floor.  Contact a doctor if:  Your back pain gets a lot worse when you do an exercise.  Your back pain does not lessen 2 hours after you exercise. If you have any of these problems, stop doing the exercises. Do not do them again unless your doctor says it is okay. Get help right away if:  You have sudden, very bad back pain. If this happens, stop doing the exercises. Do not do them again unless your doctor says it is okay. This information is not intended to replace advice given to you by your health care provider. Make sure you discuss any questions you have with your health care provider. Document Released: 12/13/2010 Document Revised: 04/17/2016 Document Reviewed: 01/04/2015 Elsevier Interactive Patient Education  [employer].

## 2018-04-15 NOTE — Progress Notes (Signed)
Subjective:    Patient ID: Haley Love, female    DOB: [birth year], 84 y.o.   MRN: [id number]  HPI   Haley Love is an 84 yr old female who presents today with chief complaint of low back pain. Pain is "like an electrical ache" and radiates into both buttocks.  Reports that she had trouble straightening up.  Walking makes it worse.Turning over in bed makes it worse.   Does not hurt at all with sitting or laying down.  Has had similar discomfort in the past but not as severe. Denies bowel/bladder incontinence.    She also reports concern re: cough. Reports that cough started last week with a "scratchy throat." Used delsym.  This helped her symptoms. She denies fever.  Having some rhinorrhea.      Review of Systems See HPI  Past Medical History:  Diagnosis Date  . Breast cancer ([place])   . Cataract   . Chickenpox   . Contact lens/glasses fitting   . Diabetes mellitus without complication ([place])   . Hyperlipidemia   . Hypertension   . Inflammatory polyps of colon [facility])      Social History   Socioeconomic History  . Marital status: Divorced    Spouse name: Not on file  . Number of children: Not on file  . Years of education: Not on file  . Highest education level: Not on file  Occupational History  . Not on file  Social Needs  . Financial resource strain: Not hard at all  . Food insecurity:    Worry: Never true    Inability: Never true  . Transportation needs:    Medical: No    Non-medical: No  Tobacco Use  . Smoking status: Never Smoker  . Smokeless tobacco: Never Used  Substance and Sexual Activity  . Alcohol use: Yes    Comment: 1 glass wine week  . Drug use: No  . Sexual activity: Never    Birth control/protection: Post-menopausal  Lifestyle  . Physical activity:    Days per week: Not on file    Minutes per session: Not on file  . Stress: Not on file  Relationships  . Social connections:    Talks on phone: Not on file    Gets together: Not on file   Attends religious service: Not on file    Active member of club or organization: Not on file    Attends meetings of clubs or organizations: Not on file    Relationship status: Not on file  . Intimate partner violence:    Fear of current or ex partner: No    Emotionally abused: No    Physically abused: No    Forced sexual activity: No  Other Topics Concern  . Not on file  Social History Narrative   Lives alone ,      Past Surgical History:  Procedure Laterality Date  . BREAST LUMPECTOMY Left 07/28/2012  . BREAST SURGERY  9/13   lt lump  . cataracts  2014  . COLONOSCOPY WITH PROPOFOL N/A 01/05/2017   Procedure: COLONOSCOPY WITH PROPOFOL;  Surgeon: Manya Silvas, MD;  Location: [facility] ENDOSCOPY;  Service: Endoscopy;  Laterality: N/A;  . JOINT REPLACEMENT Right July 2015   [place], right knee  . KNEE ARTHROSCOPY W/ MENISCAL REPAIR  09/23/11   Right  . MASTECTOMY, PARTIAL  08/06/2012   Procedure: MASTECTOMY PARTIAL;  Surgeon: Adin Hector, MD;  Location: [place];  Service: General;  Laterality:  Left;  Left partial mastectomy with re-excision of margins    Family History  Problem Relation Age of Onset  . Lung cancer Mother   . Arthritis Mother   . Hyperlipidemia Mother   . Hypertension Mother   . Throat cancer Father   . Hyperlipidemia Father   . Cancer Paternal Aunt        breast  . Dementia Brother   . Leukemia Brother        lymphatic     Allergies  Allergen Reactions  . No Known Allergies     Current Outpatient Medications on File Prior to Visit  Medication Sig Dispense Refill  . aspirin 81 MG tablet Take 81 mg by mouth daily.    Marland Kitchen b complex vitamins tablet Take 1 tablet by mouth daily.    . B Complex-C (RA B-COMPLEX/VITAMIN C CR) TBCR Take by mouth.    Chong Sicilian, Borago officinalis, (BORAGE OIL PO) Take 1,000 mg by mouth daily.    . Calcium Carbonate-Vit D-Min (RA CALCIUM 600/VIT D/MINERALS) 600-200 MG-UNIT TABS Take tablets by mouth once a  day.    . Cholecalciferol (VITAMIN D-3) 1000 UNITS CAPS Take 1 capsule by mouth at bedtime.    Marland Kitchen CINNAMON PO Take 2,000 mg by mouth at bedtime.    . Coenzyme Q10 (CO Q-10) 200 MG CAPS Take 1 capsule by mouth at bedtime.    . Flaxseed, Linseed, (FLAX SEED OIL) 1000 MG CAPS Take by mouth.    . Indole-3-Carbinol POWD Take 200 mg by mouth 2 (two) times daily.    Javier Docker Oil 1500 MG CAPS Take by mouth.    Marland Kitchen lisinopril-hydrochlorothiazide (PRINZIDE,ZESTORETIC) 10-12.5 MG tablet TAKE 1 TABLET BY MOUTH EVERY DAY 90 tablet 1  . Magnesium 500 MG CAPS Take 1 capsule by mouth daily.     . Misc Natural Products (NARCOSOFT HERBAL LAX PO) Take 2 tablets by mouth at bedtime.     . Nutritional Supplements (NUTRITIONAL SUPPLEMENT PO) Take 3 capsules by mouth 2 (two) times daily. Neprinol AFD>    . Red Yeast Rice Extract (RED YEAST RICE PO) Take 1,000 mg by mouth 2 (two) times daily.    . TDaP (BOOSTRIX) 5-2.5-18.5 LF-MCG/0.5 injection Inject 0.5 mLs into the muscle once. 0.5 mL 0  . vitamin E 400 UNIT capsule Take 400 Units by mouth daily.     No current facility-administered medications on file prior to visit.     BP 124/64 (BP Location: Left Arm, Patient Position: Sitting, Cuff Size: Normal)   Pulse 84   Temp 97.7 F (36.5 C) (Oral)   Resp 16   Wt 177 lb 7 oz (80.5 kg)   SpO2 98%   BMI 27.79 kg/m       Objective:   Physical Exam  Constitutional: She is oriented to person, place, and time. She appears well-developed and well-nourished.  Cardiovascular: Normal rate, regular rhythm and normal heart sounds.  No murmur heard. Pulmonary/Chest: Effort normal and breath sounds normal. No respiratory distress. She has no wheezes.  Musculoskeletal: She exhibits no edema.       Cervical back: She exhibits no tenderness.       Thoracic back: She exhibits no tenderness.       Lumbar back: She exhibits tenderness.  Neurological: She is alert and oriented to person, place, and time.  Reflex Scores:       Patellar reflexes are 2+ on the right side and 2+ on the left side. Bilateral LE strength  is 5/5  Skin: Skin is warm and dry.  Psychiatric: She has a normal mood and affect. Her behavior is normal. Judgment and thought content normal.          Assessment & Plan:  Low back pain with sciatica- advised pt to d/c nsaid use due to renal insufficiency. We discussed possible use of steroid medication. She declines. We also discussed rx for pain, she also declines.  Discussed back exercises that she can do at home, heating pad, red flags that should prompt her to proceed to the ED. She is advised to let us know if symptoms are not improved in 1 week.  Cough- sounds like mild allergic rhinitis with cough. Trial of claritin. She is advised to follow up if symptoms worsen or fail to improve.

## 2018-05-21 ENCOUNTER — Other Ambulatory Visit: Payer: Self-pay | Admitting: Internal Medicine

## 2018-06-21 ENCOUNTER — Other Ambulatory Visit: Payer: Self-pay | Admitting: Oncology

## 2018-06-21 DIAGNOSIS — Z1231 Encounter for screening mammogram for malignant neoplasm of breast: Secondary | ICD-10-CM

## 2018-07-14 DIAGNOSIS — L03031 Cellulitis of right toe: Secondary | ICD-10-CM | POA: Diagnosis not present

## 2018-07-27 ENCOUNTER — Ambulatory Visit
Admission: RE | Admit: 2018-07-27 | Discharge: 2018-07-27 | Disposition: A | Discharge status: Home / Self Care | Payer: Medicare HMO | Source: Ambulatory Visit | Attending: Oncology | Admitting: Oncology

## 2018-07-27 DIAGNOSIS — Z1231 Encounter for screening mammogram for malignant neoplasm of breast: Secondary | ICD-10-CM

## 2018-08-06 ENCOUNTER — Telehealth: Payer: Self-pay | Admitting: *Deleted

## 2018-08-06 ENCOUNTER — Other Ambulatory Visit (INDEPENDENT_AMBULATORY_CARE_PROVIDER_SITE_OTHER): Payer: Medicare HMO

## 2018-08-06 ENCOUNTER — Telehealth: Payer: Self-pay | Admitting: Internal Medicine

## 2018-08-06 DIAGNOSIS — N183 Chronic kidney disease, stage 3 unspecified: Secondary | ICD-10-CM

## 2018-08-06 DIAGNOSIS — E785 Hyperlipidemia, unspecified: Secondary | ICD-10-CM

## 2018-08-06 DIAGNOSIS — C9190 Lymphoid leukemia, unspecified not having achieved remission: Secondary | ICD-10-CM | POA: Diagnosis not present

## 2018-08-06 DIAGNOSIS — D472 Monoclonal gammopathy: Secondary | ICD-10-CM

## 2018-08-06 DIAGNOSIS — E119 Type 2 diabetes mellitus without complications: Secondary | ICD-10-CM

## 2018-08-06 DIAGNOSIS — C9110 Chronic lymphocytic leukemia of B-cell type not having achieved remission: Secondary | ICD-10-CM

## 2018-08-06 DIAGNOSIS — D72820 Lymphocytosis (symptomatic): Secondary | ICD-10-CM

## 2018-08-06 LAB — CBC WITH DIFFERENTIAL/PLATELET
BASOS PCT: 0.6 % (ref 0.0–3.0)
Basophils Absolute: 0.1 10*3/uL (ref 0.0–0.1)
EOS ABS: 0.5 10*3/uL (ref 0.0–0.7)
Eosinophils Relative: 2 % (ref 0.0–5.0)
HCT: 41.5 % (ref 36.0–46.0)
HEMOGLOBIN: 13.8 g/dL (ref 12.0–15.0)
Lymphocytes Relative: 75.1 % — ABNORMAL HIGH (ref 12.0–46.0)
Lymphs Abs: 16.8 10*3/uL — ABNORMAL HIGH (ref 0.7–4.0)
MCHC: 33.3 g/dL (ref 30.0–36.0)
MCV: 90.3 fl (ref 78.0–100.0)
Monocytes Absolute: 0.7 10*3/uL (ref 0.1–1.0)
Monocytes Relative: 3.1 % (ref 3.0–12.0)
Neutro Abs: 4.3 10*3/uL (ref 1.4–7.7)
Neutrophils Relative %: 19.2 % — ABNORMAL LOW (ref 43.0–77.0)
Platelets: 242 10*3/uL (ref 150.0–400.0)
RBC: 4.6 Mil/uL (ref 3.87–5.11)
RDW: 12.4 % (ref 11.5–15.5)

## 2018-08-06 LAB — COMPREHENSIVE METABOLIC PANEL
ALBUMIN: 4.3 g/dL (ref 3.5–5.2)
ALK PHOS: 43 U/L (ref 39–117)
ALT: 29 U/L (ref 0–35)
AST: 26 U/L (ref 0–37)
BUN: 36 mg/dL — AB (ref 6–23)
CO2: 28 mEq/L (ref 19–32)
CREATININE: 1.24 mg/dL — AB (ref 0.40–1.20)
Calcium: 9.8 mg/dL (ref 8.4–10.5)
Chloride: 104 mEq/L (ref 96–112)
GFR: 44.05 mL/min — ABNORMAL LOW (ref >60.00)
Glucose, Bld: 113 mg/dL — ABNORMAL HIGH (ref 70–99)
Potassium: 4.8 mEq/L (ref 3.5–5.1)
SODIUM: 140 meq/L (ref 135–145)
TOTAL PROTEIN: 7.2 g/dL (ref 6.0–8.3)
Total Bilirubin: 0.5 mg/dL (ref 0.2–1.2)

## 2018-08-06 LAB — LIPID PANEL
CHOLESTEROL: 203 mg/dL — AB (ref 0–200)
HDL: 38.9 mg/dL — ABNORMAL LOW (ref >39.00)
NonHDL: 164.53
Total CHOL/HDL Ratio: 5
Triglycerides: 214 mg/dL — ABNORMAL HIGH (ref 0.0–149.0)
VLDL: 42.8 mg/dL — ABNORMAL HIGH (ref 0.0–40.0)

## 2018-08-06 LAB — LDL CHOLESTEROL, DIRECT: Direct LDL: 139 mg/dL

## 2018-08-06 LAB — HEMOGLOBIN A1C: Hgb A1c MFr Bld: 6.5 % (ref 4.6–6.5)

## 2018-08-06 NOTE — Telephone Encounter (Signed)
Patient is aware and stated she did not get a call to set up appt with Dr. [name] but would go if referral is placed. Pt has appt with you on Tuesday.

## 2018-08-06 NOTE — Telephone Encounter (Signed)
Haley Love,  Can you tell what happened to the oncology referral I placed in march on this patient?  I have ordered it again.

## 2018-08-06 NOTE — Telephone Encounter (Signed)
PATIENT'S WHITE COUNT IS HIGHER THAN EVER. I PLACED A HEME ONC REFERRAL IN MARCH ,  DID SHE EVER GET CALLED WITH AN APPOINTMENT?

## 2018-08-06 NOTE — Telephone Encounter (Signed)
CRITICAL VALUE STICKER  CRITICAL VALUE: White Count 22.2  RECEIVER (on-site recipient of call): Hope  DATE & TIME NOTIFIED: 08/06/18 @3 :10pm  MESSENGER (representative from lab): Beverlee Nims  MD NOTIFIED: Dr. Derrel Nip  TIME OF NOTIFICATION:3:08pm  RESPONSE: see phone note or lab result

## 2018-08-06 NOTE — Telephone Encounter (Signed)
See below

## 2018-08-09 LAB — PATHOLOGIST SMEAR REVIEW

## 2018-08-09 NOTE — Telephone Encounter (Signed)
The only thing I see is:  02/17/2018 12:08 PM EDT General Per Dr Derrel Nip requested to have referral cancelled. Esau Grew R  Note Text:  Per Dr Derrel Nip requested to have referral cancelled.

## 2018-08-09 NOTE — Telephone Encounter (Signed)
Thank you.  Patient must have deferred,  I have notified her that she needs to be referred,  If Dr [name] is still working, she will want him.

## 2018-08-09 NOTE — Telephone Encounter (Signed)
opened in error

## 2018-08-10 ENCOUNTER — Ambulatory Visit (INDEPENDENT_AMBULATORY_CARE_PROVIDER_SITE_OTHER): Payer: Medicare HMO | Admitting: Internal Medicine

## 2018-08-10 ENCOUNTER — Encounter: Payer: Self-pay | Admitting: Internal Medicine

## 2018-08-10 ENCOUNTER — Telehealth: Payer: Self-pay | Admitting: Radiology

## 2018-08-10 VITALS — BP 124/68 | HR 80 | Temp 98.3°F | Resp 15 | Ht 67.0 in | Wt 170.1 lb

## 2018-08-10 DIAGNOSIS — E785 Hyperlipidemia, unspecified: Secondary | ICD-10-CM

## 2018-08-10 DIAGNOSIS — Z0000 Encounter for general adult medical examination without abnormal findings: Secondary | ICD-10-CM

## 2018-08-10 DIAGNOSIS — N183 Chronic kidney disease, stage 3 unspecified: Secondary | ICD-10-CM

## 2018-08-10 DIAGNOSIS — D472 Monoclonal gammopathy: Secondary | ICD-10-CM | POA: Diagnosis not present

## 2018-08-10 DIAGNOSIS — E119 Type 2 diabetes mellitus without complications: Secondary | ICD-10-CM | POA: Diagnosis not present

## 2018-08-10 DIAGNOSIS — G63 Polyneuropathy in diseases classified elsewhere: Secondary | ICD-10-CM | POA: Diagnosis not present

## 2018-08-10 DIAGNOSIS — D72820 Lymphocytosis (symptomatic): Secondary | ICD-10-CM | POA: Diagnosis not present

## 2018-08-10 LAB — CBC WITH DIFFERENTIAL/PLATELET
BASOS ABS: 0.2 10*3/uL — AB (ref 0.0–0.1)
Basophils Relative: 0.7 % (ref 0.0–3.0)
Eosinophils Absolute: 0.2 10*3/uL (ref 0.0–0.7)
Eosinophils Relative: 1 % (ref 0.0–5.0)
HCT: 43.4 % (ref 36.0–46.0)
Hemoglobin: 14.5 g/dL (ref 12.0–15.0)
LYMPHS ABS: 16.5 10*3/uL — AB (ref 0.7–4.0)
Lymphocytes Relative: 71.2 % — ABNORMAL HIGH (ref 12.0–46.0)
MCHC: 33.4 g/dL (ref 30.0–36.0)
MCV: 89.2 fl (ref 78.0–100.0)
MONO ABS: 1 10*3/uL (ref 0.1–1.0)
Monocytes Relative: 4.2 % (ref 3.0–12.0)
NEUTROS PCT: 22.9 % — AB (ref 43.0–77.0)
Neutro Abs: 5.3 10*3/uL (ref 1.4–7.7)
Platelets: 237 10*3/uL (ref 150.0–400.0)
RBC: 4.86 Mil/uL (ref 3.87–5.11)
RDW: 12.6 % (ref 11.5–15.5)

## 2018-08-10 LAB — PROTEIN ELECTROPHORESIS, SERUM
ALPHA 1: 0.2 g/dL (ref 0.2–0.3)
Abnormal Protein Band1: 0.5 g/dL — ABNORMAL HIGH
Albumin ELP: 4.2 g/dL (ref 3.8–4.8)
Alpha 2: 0.8 g/dL (ref 0.5–0.9)
BETA 2: 0.4 g/dL (ref 0.2–0.5)
Beta Globulin: 0.5 g/dL (ref 0.4–0.6)
Gamma Globulin: 0.9 g/dL (ref 0.8–1.7)
TOTAL PROTEIN: 7.1 g/dL (ref 6.1–8.1)

## 2018-08-10 LAB — KAPPA/LAMBDA LIGHT CHAINS
KAPPA FREE LGHT CHN: 18.9 mg/L (ref 3.3–19.4)
Kappa:Lambda Ratio: 1.43 (ref 0.26–1.65)
Lambda Free Lght Chn: 13.2 mg/L (ref 5.7–26.3)

## 2018-08-10 NOTE — Assessment & Plan Note (Signed)
M spike again seen

## 2018-08-10 NOTE — Assessment & Plan Note (Signed)
Well controlled with diet alone. Continue  low glycemic index diet utilizing smaller more frequent meals to increase metabolism.  I have also recommended that patient start exercising with a goal of 30 minutes of aerobic exercise a minimum of 5 days per week.   Lab Results  Component Value Date   HGBA1C 6.5 08/06/2018   No results found for: Haley Love

## 2018-08-10 NOTE — Progress Notes (Signed)
Patient ID: Haley Love, female    DOB: October 25, 1934  Age: 84 y.o. MRN: [id number]  The patient is here for annual preventive examination and management of other chronic and acute problems.   The risk factors are reflected in the social history.  The roster of all physicians providing medical care to patient - is listed in the Snapshot section of the chart.  Activities of daily living:  The patient is 100% independent in all ADLs: dressing, toileting, feeding as well as independent mobility  Home safety : The patient has smoke detectors in the home. They wear seatbelts.  There are no firearms at home. There is no violence in the home.   There is no risks for hepatitis, STDs or HIV. There is no   history of blood transfusion. They have no travel history to infectious disease endemic areas of the world.  The patient has seen their dentist in the last six month. They have seen their eye doctor in the last year. They admit to slight hearing difficulty with regard to whispered voices and some television programs.  They have deferred audiologic testing in the last year.  They do not  have excessive sun exposure. Discussed the need for sun protection: hats, long sleeves and use of sunscreen if there is significant sun exposure.   Diet: the importance of a healthy diet is discussed. They do have a healthy diet.  The benefits of regular aerobic exercise were discussed. .   Depression screen: there are no signs or vegative symptoms of depression- irritability, change in appetite, anhedonia, sadness/tearfullness.  Cognitive assessment: the patient manages all their financial and personal affairs and is actively engaged. They could relate day,date,year and events; recalled 2/3 objects at 3 minutes; performed clock-face test normally.  The following portions of the patient's history were reviewed and updated as appropriate: allergies, current medications, past family history, past medical history,  past  surgical history, past social history  and problem list.  Visual acuity was not assessed per patient preference since she has regular follow up with her ophthalmologist. Hearing and body mass index were assessed and reviewed.   During the course of the visit the patient was educated and counseled about appropriate screening and preventive services including : fall prevention , diabetes screening, nutrition counseling, colorectal cancer screening, and recommended immunizations.    CC: The primary encounter diagnosis was MGUS (monoclonal gammopathy of unknown significance). 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  follow up on diabetes,  Diet controlled,  And CLL.  Patient has a history of CLL and MGUS .last evaluation by hematology Sept 2018,  No follow up advised  unless there has been significant chaneg in her labs or the development of  symptoms.  .  Recent labs indicate an increase in leukocytosis from 13 K in Sept 2018 to 22K, and a persistent M spike  Of 0.5 g/dL   Was treated on Sept 22 for  Cellulitis of the second to toe that started on her second toe and had begun to spread to the entire forefoot . The infection was not precipitated by any trauma or any known insect bite.  Symptoms involved pain redness and swelling  And did not improve for a week.  symptomss improved on Day 2  of Septra  DS prescribed empirically by urgent care   Toe had been red swollen and foot hurt to walk.    No unexplained weight loss,   Some fatigue with daily activities requiring rest.  Has not been walknig due to the heat.   Taking 2 RYR capsules bid  And fish oil. Taking collAGEN POWDER and milk thistle  Patient  does not check blood sugars more than once a month,  Last one was 135 a month ago in a fasting state.  Dos not recall any above 200 lr less than 80.  No complaints today.  Not exercising on a regular basis or trying to lose weight.  Patient voices awareness  of the foods he/she needs to avoid,  And follows a low GI diet about 50% of the time.  Has not had an annual diabetic eye exam.  Denies numbness and tingling in lower extremities.  Denies hypoglycemic symptoms.    History [name] has a past medical history of Breast cancer ([place]), Cataract, Chickenpox, Contact lens/glasses fitting, Diabetes mellitus without complication ([place]), Hyperlipidemia, Hypertension, and Inflammatory polyps of colon ([place]).   She has a past surgical history that includes Breast surgery (9/13); Mastectomy, partial (08/06/2012); cataracts (2014); Knee arthroscopy w/ meniscal repair (09/23/11); Joint replacement (Right, July 2015); Colonoscopy with propofol (N/A, 01/05/2017); and Breast lumpectomy (Left, 07/28/2012).   Her family history includes Arthritis in her mother; Cancer in her paternal aunt; Dementia in her brother; Hyperlipidemia in her father and mother; Hypertension in her mother; Leukemia in her brother; Lung cancer in her mother; Throat cancer in her father.She reports that she has never smoked. She has never used smokeless tobacco. She reports that she drinks alcohol. She reports that she does not use drugs.  Outpatient Medications Prior to Visit  Medication Sig Dispense Refill  . aspirin 81 MG tablet Take 81 mg by mouth daily.    Marland Kitchen b complex vitamins tablet Take 1 tablet by mouth daily.    . B Complex-C (RA B-COMPLEX/VITAMIN C CR) TBCR Take by mouth.    Chong Sicilian, Borago officinalis, (BORAGE OIL PO) Take 1,000 mg by mouth daily.    . Calcium Carbonate-Vit D-Min (RA CALCIUM 600/VIT D/MINERALS) 600-200 MG-UNIT TABS Take tablets by mouth once a day.    . Cholecalciferol (VITAMIN D-3) 1000 UNITS CAPS Take 1 capsule by  mouth at bedtime.    Marland Kitchen CINNAMON PO Take 2,000 mg by mouth at bedtime.    . Coenzyme Q10 (CO Q-10) 200 MG CAPS Take 1 capsule by mouth at bedtime.    . Collagenase POWD by Does not apply route daily at 6 (six) AM.    . Flaxseed, Linseed, (FLAX SEED OIL) 1000 MG CAPS Take by mouth.    . Indole-3-Carbinol POWD Take 200 mg by mouth 2 (two) times daily.    Javier Docker Oil 1500 MG CAPS Take by mouth.    Marland Kitchen lisinopril-hydrochlorothiazide (PRINZIDE,ZESTORETIC) 10-12.5 MG tablet TAKE 1 TABLET BY MOUTH EVERY DAY 90 tablet 1  . Magnesium 500 MG CAPS Take 1 capsule by mouth daily.     . Milk Thistle 300 MG CAPS Take by mouth daily at 6 (six) AM.    . Misc Natural Products (NARCOSOFT HERBAL LAX PO) Take 2 tablets by mouth at bedtime.     . Nutritional Supplements (NUTRITIONAL SUPPLEMENT PO) Take 3 capsules by mouth 2 (two) times daily. Neprinol AFD>    . Red Yeast Rice Extract (RED YEAST RICE PO) Take 1,000 mg by mouth  2 (two) times daily.    . TDaP (BOOSTRIX) 5-2.5-18.5 LF-MCG/0.5 injection Inject 0.5 mLs into the muscle once. 0.5 mL 0  . vitamin E 400 UNIT capsule Take 400 Units by mouth daily.     No facility-administered medications prior to visit.     Review of Systems   Patient denies headache, fevers, malaise, unintentional weight loss, skin rash, eye pain, sinus congestion and sinus pain, sore throat, dysphagia,  hemoptysis , cough, dyspnea, wheezing, chest pain, palpitations, orthopnea, edema, abdominal pain, nausea, melena, diarrhea, constipation, flank pain, dysuria, hematuria, urinary  Frequency, nocturia, numbness, tingling, seizures,  Focal weakness, Loss of consciousness,  Tremor, insomnia, depression, anxiety, and suicidal ideation.      Objective:  BP 124/68 (BP Location: Left Arm, Patient Position: Sitting, Cuff Size: Normal)   Pulse 80   Temp 98.3 F (36.8 C) (Oral)   Resp 15   Ht 5\' 7"  (1.702 m)   Wt 170 lb 2 oz (77.2 kg)   SpO2 97%   BMI 26.65 kg/m   Physical Exam   General  appearance: alert, cooperative and appears stated age Ears: normal TM's and external ear canals both ears Throat: lips, mucosa, and tongue normal; teeth and gums normal Neck: no adenopathy, no carotid bruit, supple, symmetrical, trachea midline and thyroid not enlarged, symmetric, no tenderness/mass/nodules Back: symmetric, no curvature. ROM normal. No CVA tenderness. Lungs: clear to auscultation bilaterally Heart: regular rate and rhythm, S1, S2 normal, no murmur, click, rub or gallop Abdomen: soft, non-tender; bowel sounds normal; no masses,  no organomegaly Pulses: 2+ and symmetric Skin: Skin color, texture, turgor normal. No rashes or lesions Lymph nodes: Cervical , supraclavicular, inguinal  and axillary nodes normal.    Assessment & Plan:   Problem List Items Addressed This Visit    Atypical lymphocytosis    Probable CLL. CBC was repeated today given that her CBC was drawn last week while she had untreated cellulitis.   Lab Results  Component Value Date   WBC 23.2 Repeated and verified X2. ([place]) 08/10/2018   HGB 14.5 08/10/2018   HCT 43.4 08/10/2018   MCV 89.2 08/10/2018   PLT 237.0 08/10/2018         Relevant Orders   CBC with Differential/Platelet (Completed)   CKD (chronic kidney disease), stage III (H[facility])    Stable.  referral to  Nephrology was done last year and labs reviewed.  Reminded to avoid daily use of aleve and other NSAIDs .  Continue lisinopril. Lab Results  Component Value Date   CREATININE 1.24 (H) 08/06/2018           Diabetes mellitus type 2, diet-controlled ([place])    Well controlled with diet alone. Continue  low glycemic index diet utilizing smaller more frequent meals to increase metabolism.  I have also recommended that patient start exercising with a goal of 30 minutes of aerobic exercise a minimum of 5 days per week.   Lab Results  Component Value Date   HGBA1C 6.5 08/06/2018   No results found for: Derl Barrow         Encounter for preventive health examination    Annual comprehensive preventive exam was done as well as an evaluation and management of chronic conditions .  During the course of the visit the patient was educated and counseled about appropriate screening and preventive services including :  , nutrition counseling, breast cancer screening, and recommended immunizations, which she has refused.  Printed recommendations for health maintenance screenings was given  Hyperlipidemia with target LDL less than 100    Her ten year risk of CAD is > 25% and she has aortic atherosclerosis on plain films.  Statin advised but not tolerated,  Using red yeast rice .   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 Frutoso Chase maintain her aspirin, CINNAMON PO, Misc Natural Products (NARCOSOFT HERBAL LAX PO), Co Q-10, Vitamin D-3, Flax Seed Oil, vitamin E, b complex vitamins, Krill Oil, Red Yeast Rice Extract (RED YEAST RICE PO), Magnesium, (Borage, Borago officinalis, (BORAGE OIL PO)), Tdap, RA B-COMPLEX/VITAMIN C CR, RA CALCIUM 600/VIT D/MINERALS, Indole-3-Carbinol, Nutritional Supplements (NUTRITIONAL SUPPLEMENT PO), lisinopril-hydrochlorothiazide, Milk Thistle, and Collagenase.  No orders of the defined types were placed in this encounter.   There are no discontinued medications.  Follow-up: No follow-ups on file.   Crecencio Mc, MD

## 2018-08-10 NOTE — Telephone Encounter (Signed)
[facility] called with critical WBC result of 23.2

## 2018-08-10 NOTE — Assessment & Plan Note (Signed)
Probable CLL. CBC was repeated today given that her CBC was drawn last week while she had untreated cellulitis.   Lab Results  Component Value Date   WBC 23.2 Repeated and verified X2. ([facility]) 08/10/2018   HGB 14.5 08/10/2018   HCT 43.4 08/10/2018   MCV 89.2 08/10/2018   PLT 237.0 08/10/2018

## 2018-08-10 NOTE — Assessment & Plan Note (Addendum)
Annual comprehensive preventive exam was done as well as an evaluation and management of chronic conditions .  During the course of the visit the patient was educated and counseled about appropriate screening and preventive services including :  , nutrition counseling, breast cancer screening, and recommended immunizations, which she has refused.  Printed recommendations for health maintenance screenings was given

## 2018-08-10 NOTE — Assessment & Plan Note (Signed)
Her ten year risk of CAD is > 25% and she has aortic atherosclerosis on plain films.  Statin advised but not tolerated,  Using red yeast rice .   Lab Results  Component Value Date   CHOL 203 (H) 08/06/2018   HDL 38.90 (L) 08/06/2018   LDLCALC 109 (H) 02/01/2018   LDLDIRECT 139.0 08/06/2018   TRIG 214.0 (H) 08/06/2018   CHOLHDL 5 08/06/2018

## 2018-08-10 NOTE — Assessment & Plan Note (Signed)
Stable.  referral to  Nephrology was done last year and labs reviewed.  Reminded to avoid daily use of aleve and other NSAIDs .  Continue lisinopril. Lab Results  Component Value Date   CREATININE 1.24 (H) 08/06/2018

## 2018-08-10 NOTE — Patient Instructions (Signed)
Chronic Lymphocytic Leukemia Chronic lymphocytic leukemia (CLL) is a type of cancer of the blood cells and soft tissue inside bones (bone marrow). CLL happens when your bone marrow makes too many abnormal white blood cells. The cells, called leukemia cells, do not function normally and accumulate in the blood. Eventually they crowd out other healthy blood cells. CLL usually gets worse slowly. It can cause complications in your organs, such as in your spleen. It can also weaken your immune system and lead to conditions in which your immune system attacks your body (autoimmune conditions). What are the causes? The cause of this condition is not known. What increases the risk? You are more likely to develop this condition if:  You are older than 50 years.  You are white.  You are female.  You have a family history of CLL or other cancers of the lymph system.  You are of [place] or [place] descent.  You have been exposed to certain chemicals, such as: ? Insecticides. ? Herbicides. These include Agent Orange, a herbicide used in the [place] war.  What are the signs or symptoms? At first, there may be no symptoms. After a while, symptoms may include:  Feeling more tired than usual, even after rest.  Unplanned weight loss.  Heavy sweating at night.  Fever.  Shortness of breath.  Paleness.  Painless, swollen lymph nodes.  A feeling of fullness in the upper left part of the abdomen.  Easy bruising or bleeding.  Frequent infections.  How is this diagnosed? This condition is diagnosed based on:  A physical exam to check for an enlarged spleen, liver, or lymph nodes.  Blood and bone marrow tests to check for leukemia cells. Tests may include: ? A complete blood count. ? Flow cytometry. This method uses light sensors and dyes to figure out the number of cells as well as their size, structure, and general health. ? Immunophenotyping. This method is used to  diagnose leukemia by identifying specific antibodies found in white blood cells. The test is used when a complete blood count shows the presence of immature cells or a high number of white blood cells. ? Fluorescence in situ hybridization (FISH). This test is used to examine defects in chromosomes and how those defects affect the functioning of the cell. Results from a [place] test will be used to determine treatment and assess the outcome of that treatment.  A CT scan to check for swelling or anything abnormal in your spleen, liver, and lymph nodes.  How is this treated? Treatment for this condition depends on the stage of the leukemia and whether you have symptoms. Treatment may include:  Observation.  Targeted drugs. These are medicines that interfere with the way leukemia cells grow and multiply. They identify and attack specific leukemia cells without harming normal cells.  Chemotherapy drugs. These are medicines that kill leukemia cells that are multiplying quickly.  Radiation.  Surgery to remove the spleen.  Biological therapy (immunotherapy). This treatment boosts the ability of your immune system to fight the leukemia cells.  Bone marrow or peripheral blood stem cell transplant. This treatment replaces your own bone marrow or stem cells with bone marrow or stem cells from a donor. This treatment may be done after you receive very high doses of chemotherapy or radiation that kill your stem cells and bone marrow.  New treatments through clinical trials.  Additional medicines may be needed to help manage symptoms. Follow these instructions at home: Medicines  Take  over-the-counter and prescription medicines only as told by your health care provider.  If you were prescribed an antibiotic medicine, take it as told by your health care provider. Do not stop taking the antibiotic even if you start to feel better. If you are on chemotherapy:  Wash your hands often, especially before  meals, after being outside, and after using the toilet. Have visitors do the same.  Keep your teeth and gums clean and well cared for. Use soft toothbrushes.  Protect your skin from the sun by using sunscreen and wearing protective clothing. General instructions  Avoid contact sports or other rough activities. Ask your health care provider what activities are safe for you.  Avoid crowded places and people who are sick.  Tell your cancer care team if you develop side effects. They may be able to recommend ways to relieve them.  Try to eat regular, healthy meals. Some of your treatments might affect your appetite.  Find healthy ways of coping with stress, such as by doing yoga or meditation or by joining a support group.  Keep all follow-up visits as told by your health care provider. This is important. Where to find more information:  American Cancer Society: www.cancer.org  Leukemia and Lymphoma Society: PreviewPal.pl  National Cancer Institute ([place]): www.cancer.gov Contact a health care provider if:  You have pain in your abdomen.  You develop new bruises that are getting bigger.  You have painful or more swollen lymph nodes.  You develop bleeding from your gums or nose.  You cannot eat or drink without vomiting.  You feel lightheaded. Get help right away if:  You have a fever or chills.  You develop chest pain.  You have trouble breathing or feel short of breath.  You faint.  There is blood in your urine or stool.  You have excessive bleeding.  You have any symptoms that are severe or uncontrolled. Summary  Chronic lymphocytic leukemia (CLL) is a type of cancer of the blood cells and bone marrow.  This condition can cause an enlarged spleen, swollen lymph nodes, a weakened immune system, low red blood cell and platelets counts, and autoimmune conditions.  Treatment for this condition depends on the stage of the cancer and whether you have  symptoms.  Chemotherapy, radiation, surgery to remove the spleen, and bone marrow transplant are some of the ways to treat CLL. This information is not intended to replace advice given to you by your health care provider. Make sure you discuss any questions you have with your health care provider. Document Released: 03/29/2009 Document Revised: 10/22/2016 Document Reviewed: 10/22/2016 Elsevier Interactive Patient Education  [employer].

## 2018-08-12 ENCOUNTER — Telehealth: Payer: Self-pay | Admitting: Oncology

## 2018-08-12 ENCOUNTER — Ambulatory Visit: Payer: Self-pay

## 2018-08-12 LAB — IMMUNOFIXATION ELECTROPHORESIS
IGG (IMMUNOGLOBIN G), SERUM: 996 mg/dL (ref 600–1540)
IMMUNOGLOBULIN A: 290 mg/dL (ref 20–320)
IgM, Serum: 83 mg/dL (ref 50–300)
Immunofix Electr Int: DETECTED

## 2018-08-12 NOTE — Telephone Encounter (Signed)
LMTCB. Please transfer pt to our office.  

## 2018-08-12 NOTE — Telephone Encounter (Signed)
Spoke with patient regarding appt added. Also sent in basket message to provider regarding her request for sooner appt on 9/30 (w/ md approval only) per 9/19 sch msg

## 2018-08-12 NOTE — Telephone Encounter (Signed)
FYI

## 2018-08-12 NOTE — Telephone Encounter (Signed)
Patient called in with c/o "toe pain." She says "it's not painful, just real sore. I showed it to Dr. Derrel Nip when I was there on Tuesday and we both thought it looked fine. Yesterday it started getting more sore, pink and I applied Neosporin on it. It's swollen now, nothing like it was when I had to go to the walk in clinic. I was wondering if I needed to be on another antibiotic or what will I need to do. It is the 2nd toe on the right foot." Home care advice per protocol. I advised to continue with the Neopsporin, keeping it clean. I advised I will send this over to Dr. Derrel Nip and if she has any other recommendations, someone will call with those. Patient verbalized understanding.  Reason for Disposition . Toe pain  Answer Assessment - Initial Assessment Questions 1. ONSET: "When did the pain start?"      Sore, but got worse 2. LOCATION: "Where is the pain located?"   (e.g., around nail, entire toe, at foot joint)      One the 2nd toe on right foot 3. PAIN: "How bad is the pain?"    (Scale 1-10; or mild, moderate, severe)   -  MILD (1-3): doesn't interfere with normal activities    -  MODERATE (4-7): interferes with normal activities (e.g., work or school) or awakens from sleep, limping    -  SEVERE (8-10): excruciating pain, unable to do any normal activities, unable to walk     No pain, just soreness 4. APPEARANCE: "What does the toe look like?" (e.g., redness, swelling, bruising, pallor)     Pink, swollen 5. CAUSE: "What do you think is causing the toe pain?"     I hurt it and it's gotten more sore 6. OTHER SYMPTOMS: "Do you have any other symptoms?" (e.g., leg pain, rash, fever, numbness)     No 7. PREGNANCY: "Is there any chance you are pregnant?" "When was your last menstrual period?"     No  Protocols used: TOE PAIN-A-AH

## 2018-08-12 NOTE — Telephone Encounter (Signed)
Pt called and stated that the 2nd toe on the right foot has gotten worse since she showed it to you at her appointment on Tuesday. Pt stated that you thought it looked fine on Tuesday but yesterday it started getting more sore, and pink. The pt stated that she has been applying Neosporin to it. Pt stated that the pain isn't unbearable it's just sore. Pt is wondering if she needs another antibiotic.

## 2018-08-12 NOTE — Telephone Encounter (Signed)
She would have to be seen before antibiotics are to be prescribed  Because it looked normal to me at visit. Please schedule her an appointment tomorrow at 1:15 with me or with someone else in the office if a sooner appt is vailable.

## 2018-08-13 ENCOUNTER — Telehealth: Payer: Self-pay

## 2018-08-13 ENCOUNTER — Ambulatory Visit (INDEPENDENT_AMBULATORY_CARE_PROVIDER_SITE_OTHER): Payer: Medicare HMO

## 2018-08-13 ENCOUNTER — Encounter: Payer: Self-pay | Admitting: Internal Medicine

## 2018-08-13 ENCOUNTER — Ambulatory Visit (INDEPENDENT_AMBULATORY_CARE_PROVIDER_SITE_OTHER): Payer: Medicare HMO | Admitting: Internal Medicine

## 2018-08-13 ENCOUNTER — Ambulatory Visit: Payer: Self-pay | Admitting: Internal Medicine

## 2018-08-13 VITALS — BP 120/68 | HR 89 | Temp 98.2°F | Ht 67.0 in | Wt 172.8 lb

## 2018-08-13 DIAGNOSIS — M79674 Pain in right toe(s): Secondary | ICD-10-CM | POA: Diagnosis not present

## 2018-08-13 DIAGNOSIS — M7989 Other specified soft tissue disorders: Secondary | ICD-10-CM | POA: Diagnosis not present

## 2018-08-13 MED ORDER — METHYLPREDNISOLONE ACETATE 40 MG/ML IJ SUSP
40.0000 mg | Freq: Once | INTRAMUSCULAR | Status: AC
  Administered 2018-08-13: 40 mg via INTRAMUSCULAR

## 2018-08-13 MED ORDER — MUPIROCIN 2 % EX OINT: 1.0000 "application " | TOPICAL_OINTMENT | Freq: Two times a day (BID) | CUTANEOUS | 0 refills | Status: DC

## 2018-08-13 MED ORDER — DOXYCYCLINE HYCLATE 100 MG PO TABS: 100.0000 mg | ORAL_TABLET | Freq: Two times a day (BID) | ORAL | 0 refills | Status: DC

## 2018-08-13 NOTE — Progress Notes (Signed)
Chief Complaint  Patient presents with  . Toe Injury   Acute visit  1. Right 2nd toe pain/soreness which happened late 06/2018 she went to urgent care and was given bactrim x 7 days which she finished already. She reports pain and redness responded to antibiotics. She is not sure if she had a bug bite from wearing open toe shoes. Today toe is painful/sore red and warm, and bottom of foot in that area is painful at the pad of her foot. It also hurts to bear weight. She saw her PCP 9/17 and sx's flared on 08/11/18. She has tried topical neosporin and alcohol. She reports last pedicure was weeks ago. The inner part of her toe is peeling and warm and other parts of her right foot had pink spots on them which are now resolved  2. CLL per hematology note 07/2017 Dr. [name]. Pt was unaware that she may or may not have this as had not been clearly explained in the past per review of last H/o note this was thought likely she reports her brother has CLL as well.  Denies fever, weight loss, lymphadenopathy and has f/u H/o 09/20/18    Review of Systems  Constitutional: Negative for fever.  Musculoskeletal: Positive for joint pain.  Skin: Positive for rash.  Endo/Heme/Allergies:       Denies lymphadenopathy     Past Medical History:  Diagnosis Date  . Breast cancer ([place])   . Cataract   . Chickenpox   . Contact lens/glasses fitting   . Diabetes mellitus without complication ([place])   . Hyperlipidemia   . Hypertension   . Inflammatory polyps of colon [facility])    Past Surgical History:  Procedure Laterality Date  . BREAST LUMPECTOMY Left 07/28/2012  . BREAST SURGERY  9/13   lt lump  . cataracts  2014  . COLONOSCOPY WITH PROPOFOL N/A 01/05/2017   Procedure: COLONOSCOPY WITH PROPOFOL;  Surgeon: Manya Silvas, MD;  Location: [facility];  Service: Endoscopy;  Laterality: N/A;  . JOINT REPLACEMENT Right July 2015   [place], right knee  . KNEE ARTHROSCOPY W/ MENISCAL REPAIR  09/23/11   Right  .  MASTECTOMY, PARTIAL  08/06/2012   Procedure: MASTECTOMY PARTIAL;  Surgeon: Adin Hector, MD;  Location: [place];  Service: General;  Laterality: Left;  Left partial mastectomy with re-excision of margins   Family History  Problem Relation Age of Onset  . Lung cancer Mother   . Arthritis Mother   . Hyperlipidemia Mother   . Hypertension Mother   . Throat cancer Father   . Hyperlipidemia Father   . Cancer Paternal Aunt        breast  . Dementia Brother   . Leukemia Brother        lymphatic    Social History   Socioeconomic History  . Marital status: Divorced    Spouse name: Not on file  . Number of children: Not on file  . Years of education: Not on file  . Highest education level: Not on file  Occupational History  . Not on file  Social Needs  . Financial resource strain: Not hard at all  . Food insecurity:    Worry: Never true    Inability: Never true  . Transportation needs:    Medical: No    Non-medical: No  Tobacco Use  . Smoking status: Never Smoker  . Smokeless tobacco: Never Used  Substance and Sexual Activity  . Alcohol use: Yes  Comment: 1 glass wine week  . Drug use: No  . Sexual activity: Never    Birth control/protection: Post-menopausal  Lifestyle  . Physical activity:    Days per week: Not on file    Minutes per session: Not on file  . Stress: Not on file  Relationships  . Social connections:    Talks on phone: Not on file    Gets together: Not on file    Attends religious service: Not on file    Active member of club or organization: Not on file    Attends meetings of clubs or organizations: Not on file    Relationship status: Not on file  . Intimate partner violence:    Fear of current or ex partner: No    Emotionally abused: No    Physically abused: No    Forced sexual activity: No  Other Topics Concern  . Not on file  Social History Narrative   Lives alone ,     Current Meds  Medication Sig  . aspirin 81 MG  tablet Take 81 mg by mouth daily.  Marland Kitchen b complex vitamins tablet Take 1 tablet by mouth daily.  . B Complex-C (RA B-COMPLEX/VITAMIN C CR) TBCR Take by mouth.  Chong Sicilian, Borago officinalis, (BORAGE OIL PO) Take 1,000 mg by mouth daily.  . Calcium Carbonate-Vit D-Min (RA CALCIUM 600/VIT D/MINERALS) 600-200 MG-UNIT TABS Take tablets by mouth once a day.  . Cholecalciferol (VITAMIN D-3) 1000 UNITS CAPS Take 1 capsule by mouth at bedtime.  Marland Kitchen CINNAMON PO Take 2,000 mg by mouth at bedtime.  . Coenzyme Q10 (CO Q-10) 200 MG CAPS Take 1 capsule by mouth at bedtime.  . Collagenase POWD by Does not apply route daily at 6 (six) AM.  . Flaxseed, Linseed, (FLAX SEED OIL) 1000 MG CAPS Take by mouth.  . Indole-3-Carbinol POWD Take 200 mg by mouth 2 (two) times daily.  Javier Docker Oil 1500 MG CAPS Take by mouth.  Marland Kitchen lisinopril-hydrochlorothiazide (PRINZIDE,ZESTORETIC) 10-12.5 MG tablet TAKE 1 TABLET BY MOUTH EVERY DAY  . Magnesium 500 MG CAPS Take 1 capsule by mouth daily.   . Milk Thistle 300 MG CAPS Take by mouth daily at 6 (six) AM.  . Misc Natural Products (NARCOSOFT HERBAL LAX PO) Take 2 tablets by mouth at bedtime.   . Nutritional Supplements (NUTRITIONAL SUPPLEMENT PO) Take 3 capsules by mouth 2 (two) times daily. Neprinol AFD>  . Red Yeast Rice Extract (RED YEAST RICE PO) Take 1,000 mg by mouth 2 (two) times daily.  . TDaP (BOOSTRIX) 5-2.5-18.5 LF-MCG/0.5 injection Inject 0.5 mLs into the muscle once.  . vitamin E 400 UNIT capsule Take 400 Units by mouth daily.   Allergies  Allergen Reactions  . No Known Allergies    Recent Results (from the past 2160 hour(s))  Lipid panel     Status: Abnormal   Collection Time: 08/06/18 10:14 AM  Result Value Ref Range   Cholesterol 203 (H) 0 - 200 mg/dL    Comment: ATP III Classification       Desirable:  < 200 mg/dL               Borderline High:  200 - 239 mg/dL          High:  > = 240 mg/dL   Triglycerides 214.0 (H) 0.0 - 149.0 mg/dL    Comment: Normal:  <150  mg/dLBorderline High:  150 - 199 mg/dL   HDL 38.90 (L) >39.00 mg/dL   VLDL  42.8 (H) 0.0 - 40.0 mg/dL   Total CHOL/HDL Ratio 5     Comment:                Men          Women1/2 Average Risk     3.4          3.3Average Risk          5.0          4.42X Average Risk          9.6          7.13X Average Risk          15.0          11.0                       NonHDL 164.53     Comment: NOTE:  Non-HDL goal should be 30 mg/dL higher than patient's LDL goal (i.e. LDL goal of < 70 mg/dL, would have non-HDL goal of < 100 mg/dL)  Hemoglobin A1c     Status: None   Collection Time: 08/06/18 10:14 AM  Result Value Ref Range   Hgb A1c MFr Bld 6.5 4.6 - 6.5 %    Comment: Glycemic Control Guidelines for People with Diabetes:Non Diabetic:  <6%Goal of Therapy: <7%Additional Action Suggested:  >8%   Pathologist smear review     Status: None   Collection Time: 08/06/18 10:14 AM  Result Value Ref Range   Path Review      Comment: Absolute lymphocytosis and atypical lymphocytes, suggestive of a lymphoproliferative process. Suggest immunophenotyping by flow cytometry for further evaluation. RBCs and platelets are unremarkable. Reviewed by Francis Gaines Rockne Coons, MD  (Electronic Signature on File)      [phone].   Comprehensive metabolic panel     Status: Abnormal   Collection Time: 08/06/18 10:14 AM  Result Value Ref Range   Sodium 140 135 - 145 mEq/L   Potassium 4.8 3.5 - 5.1 mEq/L   Chloride 104 96 - 112 mEq/L   CO2 28 19 - 32 mEq/L   Glucose, Bld 113 (H) 70 - 99 mg/dL   BUN 36 (H) 6 - 23 mg/dL   Creatinine, Ser 1.24 (H) 0.40 - 1.20 mg/dL   Total Bilirubin 0.5 0.2 - 1.2 mg/dL   Alkaline Phosphatase 43 39 - 117 U/L   AST 26 0 - 37 U/L   ALT 29 0 - 35 U/L   Total Protein 7.2 6.0 - 8.3 g/dL   Albumin 4.3 3.5 - 5.2 g/dL   Calcium 9.8 8.4 - 10.5 mg/dL   GFR 44.05 (L) >60.00 mL/min  Protein electrophoresis, serum     Status: Abnormal   Collection Time: 08/06/18 10:14 AM  Result Value Ref Range   Total  Protein 7.1 6.1 - 8.1 g/dL   Albumin ELP 4.2 3.8 - 4.8 g/dL   Alpha 1 0.2 0.2 - 0.3 g/dL   Alpha 2 0.8 0.5 - 0.9 g/dL   Beta Globulin 0.5 0.4 - 0.6 g/dL   Beta 2 0.4 0.2 - 0.5 g/dL   Gamma Globulin 0.9 0.8 - 1.7 g/dL   Abnormal Protein Band1 0.5 (H) NONE DETEC g/dL   SPE Interp.      Comment: . Evaluation reveals a restricted band (M-spike)  migrating in the gamma globulin region.  Consider  immunofixation analysis if indicated. .   Kappa/lambda light chains     Status: None   Collection  Time: 08/06/18 10:14 AM  Result Value Ref Range   Kappa free light chain 18.9 3.3 - 19.4 mg/L   Lambda Free Lght Chn 13.2 5.7 - 26.3 mg/L   Kappa:Lambda Ratio 1.43 0.26 - 1.65    Comment: Free kappa/lambda ratio in serum of normal individuals is 0.26-1.65. Excess production of free kappa or lambda chains can alter this ratio. Monoclonal free light chains are found in serum of patients with multiple myeloma, Waldenstrom's macroglobulinemia, mu-heavy chain disease, primary amyloidosis, light chain deposition disease, monoclonal gammopathy of undetermined significance, and lymphoproliferative disorders. Measurement of free light chain concentration in serum is useful for diagnosis, prognosis, monitoring disease activity and following response to therapy of these disorders.   CBC with Differential/Platelet     Status: Abnormal   Collection Time: 08/06/18 10:14 AM  Result Value Ref Range   WBC 22.3 Repeated and verified X2. (HH) 4.0 - 10.5 K/uL   RBC 4.60 3.87 - 5.11 Mil/uL   Hemoglobin 13.8 12.0 - 15.0 g/dL   HCT 41.5 36.0 - 46.0 %   MCV 90.3 78.0 - 100.0 fl   MCHC 33.3 30.0 - 36.0 g/dL   RDW 12.4 11.5 - 15.5 %   Platelets 242.0 150.0 - 400.0 K/uL   Neutrophils Relative % 19.2 (L) 43.0 - 77.0 %   Lymphocytes Relative 75.1 Repeated and verified X2. (H) 12.0 - 46.0 %   Monocytes Relative 3.1 3.0 - 12.0 %   Eosinophils Relative 2.0 0.0 - 5.0 %   Basophils Relative 0.6 0.0 - 3.0 %   Neutro  Abs 4.3 1.4 - 7.7 K/uL   Lymphs Abs 16.8 (H) 0.7 - 4.0 K/uL   Monocytes Absolute 0.7 0.1 - 1.0 K/uL   Eosinophils Absolute 0.5 0.0 - 0.7 K/uL   Basophils Absolute 0.1 0.0 - 0.1 K/uL  LDL cholesterol, direct     Status: None   Collection Time: 08/06/18 10:14 AM  Result Value Ref Range   Direct LDL 139.0 mg/dL    Comment: Optimal:  <100 mg/dLNear or Above Optimal:  100-129 mg/dLBorderline High:  130-159 mg/dLHigh:  160-189 mg/dLVery High:  >190 mg/dL  CBC with Differential/Platelet     Status: Abnormal   Collection Time: 08/10/18  1:39 PM  Result Value Ref Range   WBC 23.2 Repeated and verified X2. (HH) 4.0 - 10.5 K/uL    Comment: pt. is a known CLL patient   RBC 4.86 3.87 - 5.11 Mil/uL   Hemoglobin 14.5 12.0 - 15.0 g/dL   HCT 43.4 36.0 - 46.0 %   MCV 89.2 78.0 - 100.0 fl   MCHC 33.4 30.0 - 36.0 g/dL   RDW 12.6 11.5 - 15.5 %   Platelets 237.0 150.0 - 400.0 K/uL   Neutrophils Relative % 22.9 (L) 43.0 - 77.0 %   Lymphocytes Relative 71.2 Repeated and verified X2. (H) 12.0 - 46.0 %   Monocytes Relative 4.2 3.0 - 12.0 %   Eosinophils Relative 1.0 0.0 - 5.0 %   Basophils Relative 0.7 0.0 - 3.0 %   Neutro Abs 5.3 1.4 - 7.7 K/uL   Lymphs Abs 16.5 (H) 0.7 - 4.0 K/uL   Monocytes Absolute 1.0 0.1 - 1.0 K/uL   Eosinophils Absolute 0.2 0.0 - 0.7 K/uL   Basophils Absolute 0.2 (H) 0.0 - 0.1 K/uL  Immunofixation electrophoresis     Status: None   Collection Time: 08/10/18  1:39 PM  Result Value Ref Range   Immunofix Electr Int IGG LAMBDA MONOCLONAL PROTEIN DETECTED  Comment: . If Bence-Jones proteinuria (free light chain) is a clinical concern, immunofixation analysis on 24 hour urine collection is suggested. .    Immunoglobulin A 290 20 - 320 mg/dL   IgG (Immunoglobin G), Serum 996 600 - 1,540 mg/dL   IgM, Serum 83 50 - 300 mg/dL   Objective  Body mass index is 27.06 kg/m. Wt Readings from Last 3 Encounters:  08/13/18 172 lb 12.8 oz (78.4 kg)  08/10/18 170 lb 2 oz (77.2 kg)    04/15/18 177 lb 7 oz (80.5 kg)   Temp Readings from Last 3 Encounters:  08/13/18 98.2 F (36.8 C) (Oral)  08/10/18 98.3 F (36.8 C) (Oral)  04/15/18 97.7 F (36.5 C) (Oral)   BP Readings from Last 3 Encounters:  08/13/18 120/68  08/10/18 124/68  04/15/18 124/64   Pulse Readings from Last 3 Encounters:  08/13/18 89  08/10/18 80  04/15/18 84    Physical Exam  Constitutional: She is oriented to person, place, and time. Vital signs are normal. She appears well-developed and well-nourished. She is cooperative.  HENT:  Head: Normocephalic and atraumatic.  Mouth/Throat: Oropharynx is clear and moist and mucous membranes are normal.  Eyes: Pupils are equal, round, and reactive to light. Conjunctivae are normal.  Cardiovascular: Normal rate, regular rhythm and normal heart sounds.  Pulmonary/Chest: Effort normal and breath sounds normal.  Musculoskeletal:       Feet:  Neurological: She is alert and oriented to person, place, and time. Gait normal.  Skin: Skin is warm, dry and intact.  Psychiatric: She has a normal mood and affect. Her speech is normal and behavior is normal. Judgment and thought content normal. Cognition and memory are normal.  Nursing note and vitals reviewed.   Assessment   1. Right toe pain ddx arthritis, gout, cellulitis/bite  2. Likely CLL per last hematology notes 07/2017, [facility]US Plan  1.  Warm soaks bactroban  Doxycycline 100 mg bid x 1 week with food  Trial of depomedrol 40 x 1  Xray right 2nd toe today Avoid NSAIDs given renal function  F/u with PCP in 1-2 weeks  2. H/O appt Dr. [name] 09/20/18  Given info about CLL Provider: Dr. Olivia Mackie McLean-Scocuzza-Internal Medicine

## 2018-08-13 NOTE — Patient Instructions (Addendum)
Warm soaks of foot    Chronic Lymphocytic Leukemia Chronic lymphocytic leukemia (CLL) is a type of cancer of the blood cells and soft tissue inside bones (bone marrow). CLL happens when your bone marrow makes too many abnormal white blood cells. The cells, called leukemia cells, do not function normally and accumulate in the blood. Eventually they crowd out other healthy blood cells. CLL usually gets worse slowly. It can cause complications in your organs, such as in your spleen. It can also weaken your immune system and lead to conditions in which your immune system attacks your body (autoimmune conditions). What are the causes? The cause of this condition is not known. What increases the risk? You are more likely to develop this condition if:  You are older than 50 years.  You are white.  You are female.  You have a family history of CLL or other cancers of the lymph system.  You are of [place] or [place] descent.  You have been exposed to certain chemicals, such as: ? Insecticides. ? Herbicides. These include Agent Orange, a herbicide used in the [place] war.  What are the signs or symptoms? At first, there may be no symptoms. After a while, symptoms may include:  Feeling more tired than usual, even after rest.  Unplanned weight loss.  Heavy sweating at night.  Fever.  Shortness of breath.  Paleness.  Painless, swollen lymph nodes.  A feeling of fullness in the upper left part of the abdomen.  Easy bruising or bleeding.  Frequent infections.  How is this diagnosed? This condition is diagnosed based on:  A physical exam to check for an enlarged spleen, liver, or lymph nodes.  Blood and bone marrow tests to check for leukemia cells. Tests may include: ? A complete blood count. ? Flow cytometry. This method uses light sensors and dyes to figure out the number of cells as well as their size, structure, and general  health. ? Immunophenotyping. This method is used to diagnose leukemia by identifying specific antibodies found in white blood cells. The test is used when a complete blood count shows the presence of immature cells or a high number of white blood cells. ? Fluorescence in situ hybridization (FISH). This test is used to examine defects in chromosomes and how those defects affect the functioning of the cell. Results from a [place] test will be used to determine treatment and assess the outcome of that treatment.  A CT scan to check for swelling or anything abnormal in your spleen, liver, and lymph nodes.  How is this treated? Treatment for this condition depends on the stage of the leukemia and whether you have symptoms. Treatment may include:  Observation.  Targeted drugs. These are medicines that interfere with the way leukemia cells grow and multiply. They identify and attack specific leukemia cells without harming normal cells.  Chemotherapy drugs. These are medicines that kill leukemia cells that are multiplying quickly.  Radiation.  Surgery to remove the spleen.  Biological therapy (immunotherapy). This treatment boosts the ability of your immune system to fight the leukemia cells.  Bone marrow or peripheral blood stem cell transplant. This treatment replaces your own bone marrow or stem cells with bone marrow or stem cells from a donor. This treatment may be done after you receive very high doses of chemotherapy or radiation that kill your stem cells and bone marrow.  New treatments through clinical trials.  Additional medicines may be needed to help manage symptoms. Follow  these instructions at home: Medicines  Take over-the-counter and prescription medicines only as told by your health care provider.  If you were prescribed an antibiotic medicine, take it as told by your health care provider. Do not stop taking the antibiotic even if you start to feel better. If you are on  chemotherapy:  Wash your hands often, especially before meals, after being outside, and after using the toilet. Have visitors do the same.  Keep your teeth and gums clean and well cared for. Use soft toothbrushes.  Protect your skin from the sun by using sunscreen and wearing protective clothing. General instructions  Avoid contact sports or other rough activities. Ask your health care provider what activities are safe for you.  Avoid crowded places and people who are sick.  Tell your cancer care team if you develop side effects. They may be able to recommend ways to relieve them.  Try to eat regular, healthy meals. Some of your treatments might affect your appetite.  Find healthy ways of coping with stress, such as by doing yoga or meditation or by joining a support group.  Keep all follow-up visits as told by your health care provider. This is important. Where to find more information:  American Cancer Society: www.cancer.org  Leukemia and Lymphoma Society: PreviewPal.pl  National Cancer Institute ([place]): www.cancer.gov Contact a health care provider if:  You have pain in your abdomen.  You develop new bruises that are getting bigger.  You have painful or more swollen lymph nodes.  You develop bleeding from your gums or nose.  You cannot eat or drink without vomiting.  You feel lightheaded. Get help right away if:  You have a fever or chills.  You develop chest pain.  You have trouble breathing or feel short of breath.  You faint.  There is blood in your urine or stool.  You have excessive bleeding.  You have any symptoms that are severe or uncontrolled. Summary  Chronic lymphocytic leukemia (CLL) is a type of cancer of the blood cells and bone marrow.  This condition can cause an enlarged spleen, swollen lymph nodes, a weakened immune system, low red blood cell and platelets counts, and autoimmune conditions.  Treatment for this condition depends on the  stage of the cancer and whether you have symptoms.  Chemotherapy, radiation, surgery to remove the spleen, and bone marrow transplant are some of the ways to treat CLL. This information is not intended to replace advice given to you by your health care provider. Make sure you discuss any questions you have with your health care provider. Document Released: 03/29/2009 Document Revised: 10/22/2016 Document Reviewed: 10/22/2016 Elsevier Interactive Patient Education  2018 [place] Pain Many things can cause foot pain. Some common causes are:  An injury.  A sprain.  Arthritis.  Blisters.  Bunions.  Follow these instructions at home: Pay attention to any changes in your symptoms. Take these actions to help with your discomfort:  If directed, put ice on the affected area: ? Put ice in a plastic bag. ? Place a towel between your skin and the bag. ? Leave the ice on for 15-20 minutes, 3?4 times a day for 2 days.  Take over-the-counter and prescription medicines only as told by your health care provider.  Wear comfortable, supportive shoes that fit you well. Do not wear high heels.  Do not stand or walk for long periods of time.  Do not lift a lot of weight. This can put  added pressure on your feet.  Do stretches to relieve foot pain and stiffness as told by your health care provider.  Rub your foot gently.  Keep your feet clean and dry.  Contact a health care provider if:  Your pain does not get better after a few days of self-care.  Your pain gets worse.  You cannot stand on your foot. Get help right away if:  Your foot is numb or tingling.  Your foot or toes are swollen.  Your foot or toes turn white or blue.  You have warmth and redness along your foot. This information is not intended to replace advice given to you by your health care provider. Make sure you discuss any questions you have with your health care provider. Document Released:  12/07/2015 Document Revised: 04/17/2016 Document Reviewed: 12/06/2014 Elsevier Interactive Patient Education  [employer].

## 2018-08-13 NOTE — Progress Notes (Signed)
Pre visit review using our clinic review tool, if applicable. No additional management support is needed unless otherwise documented below in the visit note. 

## 2018-08-13 NOTE — Telephone Encounter (Signed)
Please see previous message

## 2018-08-13 NOTE — Telephone Encounter (Signed)
  Patient was calling for her lab results- in conversation- she mentioned that she thinks the infection in her toe she was treated for may be coming back- she was in to see Dr Derrel Nip and it had not flared- but now it is pink and swollen with tenderness. Appointment scheduled for evaluation of area- patient has compromised cell count. Reason for Disposition . Looks like a boil, infected sore, or deep ulcer  Answer Assessment - Initial Assessment Questions 1. ONSET: "When did the pain start?"      Patient was seen at [facility]- treated for infection - Sulfamethoxazole for 1 week 2. LOCATION: "Where is the pain located?"   (e.g., around nail, entire toe, at foot joint)      Second toe of R foot- fine on Tuesday- Wednesday started hurting again- pink and swollen and tight to squeeze 3. PAIN: "How bad is the pain?"    (Scale 1-10; or mild, moderate, severe)   -  MILD (1-3): doesn't interfere with normal activities    -  MODERATE (4-7): interferes with normal activities (e.g., work or school) or awakens from sleep, limping    -  SEVERE (8-10): excruciating pain, unable to do any normal activities, unable to walk     No pain- just sore 4. APPEARANCE: "What does the toe look like?" (e.g., redness, swelling, bruising, pallor)     Pain and swelling 5. CAUSE: "What do you think is causing the toe pain?"     Infection not gone 6. OTHER SYMPTOMS: "Do you have any other symptoms?" (e.g., leg pain, rash, fever, numbness)     No other symptoms 7. PREGNANCY: "Is there any chance you are pregnant?" "When was your last menstrual period?"     n/a  Protocols used: TOE PAIN-A-AH

## 2018-08-13 NOTE — Telephone Encounter (Signed)
Copied from [place] [id number]. Topic: Quick Communication - Office Called Patient >> Aug 13, 2018 10:02 AM Lennox Solders wrote: Reason for CRM: pt is returning [place] call from yesterday.

## 2018-08-17 NOTE — Telephone Encounter (Signed)
Spoke with pt and she stated that she saw Dr. Aundra Love on the 20th and she did an xray. The pt stated that her toe is much better.

## 2018-08-23 ENCOUNTER — Ambulatory Visit: Payer: Medicare HMO | Admitting: Oncology

## 2018-08-27 ENCOUNTER — Ambulatory Visit: Payer: Medicare HMO | Admitting: Internal Medicine

## 2018-09-16 ENCOUNTER — Other Ambulatory Visit: Payer: Self-pay | Admitting: Oncology

## 2018-09-19 NOTE — Progress Notes (Signed)
ID: Haley Love   DOB: May 17, 1934  MR#: [id number]  CSN#:[id number]   Patient Care Team: Crecencio Mc, MD as PCP - General (Internal Medicine) Magrinat, Virgie Dad, MD as Consulting Physician (Oncology)  CHIEF COMPLAINT: left breast cancer; M-GUS, CLL  CURRENT THERAPY: observation  BREAST CANCER HISTORY: From the original intake note:  Haley Love at the [facility] 06/17/2012 which showed a possible mass in the left breast. Left diagnostic Love performed 07/02/2012 showed a persistent 1 cm spiculated mass in the lateral left breast, which was palpable and firm. Ultrasound showed an irregular hypoechoic mass measuring 8 mm. The left axilla was unremarkable.  Biopsy of the mass the same day ([id number]) showed an invasive ductal carcinoma, grade 1, estrogen receptor 100% progesterone receptor 39% positive, with no HER-2 amplification and an MIB-1 of 7%. Bilateral breast MRI were obtained 07/08/2012 this showed an irregular enhancing mass in the left breast, middle third, measuring 9 mm. There were no other suspicious masses in either breast and no axillary or internal mammary adenopathy.  The patient's subsequent history is as detailed below.  INTERVAL HISTORY: Haley Love returns today for follow-up of her history of breast cancer and her monoclonal gammopathy and CLL. As far as the breast cancer is concerned she is followed with observation alone, not having been able to tolerate any anti-estrogens. She is doing well overall.   Since her last visit to the office, she underwent screening bilateral mammogram at [facility] that showed: Breast density category B. No mammographic evidence of malignancy.   As far as her possible CLL and her history of monoclonal gammopathy, she had a smear review on 08/06/2018 which showed atypical lymphocytes and flow cytometry was suggested.  SPEP showed an M spike in the 0.5 g/dL range, normal kappa/lambda ratios.  However the  total immunoglobulins were in the normal range, with the IgG 996, IgM 83, and IgA 290.  Her absolute lymphocyte count is as follows:  Results for Haley Love (MRN [id number]) as of 09/20/2018 13:26  Ref. Range 02/27/2016 10:45 08/05/2017 11:17 02/01/2018 09:11 08/06/2018 10:14 08/10/2018 13:39  Lymphocyte # Latest Ref Range: 0.7 - 4.0 K/uL 6.6 (H) 7.6 (H) 13.6 (H) 16.8 (H) 16.5 (H)    She has no "B" symptoms, and specifically denies unexplained fatigue, unexplained weight loss, drenching sweats, fever, rash, bleeding, adenopathy, or any cytopenias that she is aware of.    REVIEW OF SYSTEMS: Haley Love reports that for exercise, she has been walking and she typically walks from 1.5-2 miles. She has a hx of sciatic back pain on both sides. She denies a fall recent and notes that her gait is normal. She denies unusual headaches, visual changes, nausea, vomiting, or dizziness. There has been no unusual cough, phlegm production, or pleurisy. This been no change in bowel or bladder habits. She denies unexplained fatigue or unexplained weight loss, bleeding, rash, or fever. A detailed review of systems was otherwise stable.    PAST MEDICAL HISTORY: Past Medical History:  Diagnosis Date  . Breast cancer ([place])   . Cataract   . Chickenpox   . Contact lens/glasses fitting   . Diabetes mellitus without complication ([place])   . Hyperlipidemia   . Hypertension   . Inflammatory polyps of colon ([place])     PAST SURGICAL HISTORY: Past Surgical History:  Procedure Laterality Date  . BREAST LUMPECTOMY Left 07/28/2012  . BREAST SURGERY  9/13   lt lump  . cataracts  2014  .  COLONOSCOPY WITH PROPOFOL N/A 01/05/2017   Procedure: COLONOSCOPY WITH PROPOFOL;  Surgeon: Manya Silvas, MD;  Location: [facility] ENDOSCOPY;  Service: Endoscopy;  Laterality: N/A;  . JOINT REPLACEMENT Right July 2015   [place], right knee  . KNEE ARTHROSCOPY W/ MENISCAL REPAIR  09/23/11   Right  . MASTECTOMY, PARTIAL  08/06/2012   Procedure:  MASTECTOMY PARTIAL;  Surgeon: Adin Hector, MD;  Location: [facility];  Service: General;  Laterality: Left;  Left partial mastectomy with re-excision of margins  Right knee replacement in late July 2015.  FAMILY HISTORY Family History  Problem Relation Age of Onset  . Lung cancer Mother   . Arthritis Mother   . Hyperlipidemia Mother   . Hypertension Mother   . Throat cancer Father   . Hyperlipidemia Father   . Cancer Paternal Aunt        breast  . Dementia Brother   . Leukemia Brother        lymphatic /CLL   the patient's father died at the age of 60 from cancer of the throat which had been diagnosed 6 years earlier. The patient's mother died at the age of 48. She had cancer of the lung lining diagnosed at age 19. The patient had 4 brothers, no sisters. There is no other cancer history in the immediate family.  GYNECOLOGIC HISTORY: Menarche age 4. Change of life age 64. She took hormone replacement approximately 10 years. She is GX P2, with first live birth at age 66.  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 She is divorced. Her son Haley Love (goes by "J.") also lives in [place] and is self-employed running a Education administrator business. Daughter Haley Love lives in [place]. She works as a Electrical engineer. The patient has 3 grandchildren. She attends a [employer].  ADVANCED DIRECTIVES: In place  HEALTH MAINTENANCE: Social History   Tobacco Use  . Smoking status: Never Smoker  . Smokeless tobacco: Never Used  Substance Use Topics  . Alcohol use: Yes    Comment: 1 glass wine week  . Drug use: No     Colonoscopy: 2012  PAP: 2011  Bone density: 2011  Lipid panel:  Allergies  Allergen Reactions  . No Known Allergies    OBJECTIVE: Older white woman in no acute distress  ECOG: 0  Vitals:   09/20/18 1318  BP: 133/68  Pulse: 85  Resp: 18  Temp: 98 F (36.7 C)  SpO2: 95%  Body mass index  is 26.85 kg/m.  Sclerae unicteric, pupils round and equal Oropharynx clear and moist No cervical or supraclavicular adenopathy, no axillary or inguinal adenopathy Lungs no rales or rhonchi Heart regular rate and rhythm Abd soft, nontender, positive bowel sounds MSK no focal spinal tenderness, no upper extremity lymphedema Neuro: nonfocal, well oriented, appropriate affect Breasts: The right breast is unremarkable.  The left breast is status post lumpectomy.  There are no suspicious masses.  There are no skin or nipple changes of concern.   LAB RESULTS: Lab Results  Component Value Date   WBC 23.2 Repeated and verified X2. (HH) 08/10/2018   NEUTROABS 5.3 08/10/2018   HGB 14.5 08/10/2018   HCT 43.4 08/10/2018   MCV 89.2 08/10/2018   PLT 237.0 08/10/2018      Chemistry      Component Value Date/Time   NA 140 08/06/2018 1014   NA 140 08/06/2017 1119   K 4.8 08/06/2018 1014  K 4.9 08/06/2017 1119   CL 104 08/06/2018 1014   CL 103 06/16/2014 0617   CL 105 09/08/2012 1002   CO2 28 08/06/2018 1014   CO2 27 08/06/2017 1119   BUN 36 (H) 08/06/2018 1014   BUN 37.3 (H) 08/06/2017 1119   CREATININE 1.24 (H) 08/06/2018 1014   CREATININE 1.4 (H) 08/06/2017 1119      Component Value Date/Time   CALCIUM 9.8 08/06/2018 1014   CALCIUM 10.4 08/06/2017 1119   ALKPHOS 43 08/06/2018 1014   ALKPHOS 59 08/06/2017 1119   AST 26 08/06/2018 1014   AST 47 (H) 08/06/2017 1119   ALT 29 08/06/2018 1014   ALT 43 08/06/2017 1119   BILITOT 0.5 08/06/2018 1014   BILITOT 0.48 08/06/2017 1119       Lab Results  Component Value Date   LABCA2 35 07/14/2012   :  MGUS (monoclonal gammopathy of unknow...   Ref Range & Units 54moago (08/06/18) 131mogo (08/06/18) 68m60moo (02/01/18)  Total Protein 6.1 - 8.1 g/dL 7.1  7.2 R 6.9 R  Albumin ELP 3.8 - 4.8 g/dL 4.2     Alpha 1 0.2 - 0.3 g/dL 0.2     Alpha 2 0.5 - 0.9 g/dL 0.8     Beta Globulin 0.4 - 0.6 g/dL 0.5     Beta 2 0.2 - 0.5 g/dL 0.4      Gamma Globulin 0.8 - 1.7 g/dL 0.9     Abnormal Protein Band1 NONE DETEC g/dL 0.5High             Immunoglobulin A 20 - 320 mg/dL 290    IgG (Immunoglobin G), Serum 600 - 1,540 mg/dL 996  981 R  IgM, Serum 50 - 300 mg/dL 83  69 R  Resulting Agency        STUDIES: Since her last visit to the office, [facility] category B. No mammographic evidence of malignancy.    ASSESSMENT: [age]o. [facility] woman s/p Left lumpectomy 07/28/2012 for a pT1c NX, stage IA invasive ductal carcinoma, grade 1, estrogen receptor 100% and progesterone receptor 39% positive, with no HER-2 amplification and an MIB-1 of 7.   (1) initially positive margins were cleared 08/06/2012  (2) tamoxifen started October 2013, stopped March 2014 because of cramps and hip pain  (3) anastrozole started May 2014-- patient tells me she stopped after a few days "with the same symptoms as tamoxifen"  (4) patient chose to forego anti-estrogen therapy altogether, she will follow up in office until she is 5 years out from her definitive surgery.  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  Taking her problems 1 at a time:  She is now a little over 6 years out from definitive surgery for her breast cancer.  Even though she did not receive antiestrogens or radiation, there is no evidence of disease recurrence.  This is favorable.  All she needs is her yearly mammogram and yearly physical exam  As far as chronic lymphoid leukemia is concerned she meets criteria.  I am going  to add  flow cytometry with her next set of labs here to type it further.  We discussed the fact that unless certain symptoms or cytopenias develop there is no indication for therapy and that some patients never need therapy.  Those to do need therapy usually can be trained with non-chemotherapy agents.  For now though all she needs is observation.  The very small monoclonal spike is also part of her immune dysregulation from her chronic lymphoid leukemia.  Currently the spike is 0.5 grams and until it reaches around 3 g it does not raise concerns regarding myeloma.  In short even though she does have 2 cancers or at least a history of 2 cancers there is no need for treatment at this point.  She will see her primary care physician in March.  She will have repeat lab work here in September 2020 and she will see me approximately a month later to discuss those results  Her son was with her today and I was able to give him this information also in writing.  They know to call for any other issues that may develop before the next visit.  Magrinat, Virgie Dad, MD  09/20/18 1:55 PM Medical Oncology and Hematology [facility] [address] [place], [place] [address] Tel. [phone]    Fax. [phone]    I, Soijett Blue am acting as scribe for Dr. Sarajane Jews C. Magrinat.  I, Lurline Del MD, have reviewed the above documentation for accuracy and completeness, and I agree with the above.

## 2018-09-20 ENCOUNTER — Inpatient Hospital Stay: Payer: Medicare HMO | Attending: Oncology | Admitting: Oncology

## 2018-09-20 ENCOUNTER — Telehealth: Payer: Self-pay | Admitting: Oncology

## 2018-09-20 VITALS — BP 133/68 | HR 85 | Temp 98.0°F | Resp 18 | Ht 67.0 in | Wt 171.4 lb

## 2018-09-20 DIAGNOSIS — Z853 Personal history of malignant neoplasm of breast: Secondary | ICD-10-CM | POA: Insufficient documentation

## 2018-09-20 DIAGNOSIS — C50512 Malignant neoplasm of lower-outer quadrant of left female breast: Secondary | ICD-10-CM

## 2018-09-20 DIAGNOSIS — D472 Monoclonal gammopathy: Secondary | ICD-10-CM

## 2018-09-20 DIAGNOSIS — Z170 Estrogen receptor positive status [ER+]: Secondary | ICD-10-CM

## 2018-09-20 DIAGNOSIS — C9110 Chronic lymphocytic leukemia of B-cell type not having achieved remission: Secondary | ICD-10-CM

## 2018-09-20 DIAGNOSIS — C91Z0 Other lymphoid leukemia not having achieved remission: Secondary | ICD-10-CM | POA: Insufficient documentation

## 2018-09-20 NOTE — Telephone Encounter (Signed)
Gave patient avs and appt date/time.

## 2018-10-13 DIAGNOSIS — D0439 Carcinoma in situ of skin of other parts of face: Secondary | ICD-10-CM | POA: Diagnosis not present

## 2018-10-13 DIAGNOSIS — X32XXXA Exposure to sunlight, initial encounter: Secondary | ICD-10-CM | POA: Diagnosis not present

## 2018-10-13 DIAGNOSIS — L570 Actinic keratosis: Secondary | ICD-10-CM | POA: Diagnosis not present

## 2018-10-13 DIAGNOSIS — D485 Neoplasm of uncertain behavior of skin: Secondary | ICD-10-CM | POA: Diagnosis not present

## 2018-10-27 ENCOUNTER — Ambulatory Visit: Payer: Self-pay

## 2018-10-27 NOTE — Telephone Encounter (Signed)
Ret'd call to pt.  Reported she has had a chronic "ache" in low back, that radiates down both buttocks and into post. thighs. Stated that this has been going on about 2 mos. or more.  Rated it at "5-6/10", and that "it really isn't pain, it is more of an ache."  Stated it only is present when she is walking around; if she sits or lays down,  the ache eases. Denied any weakness, numbness, or tingling of extremities.  Reported she feels an "electric shock" in left hip, intermittently, that makes her feel like her left leg is going to give out.  Denied actual "weakness" in left leg. Denied swelling, redness or warmth of LE's.  Denied any recent injury to her back.  Appt. Sched. At PCP office 12/5.  Care advice given per protocol.  Verb. Understanding. Agrees with plan.                Reason for Disposition . Back pain is a chronic symptom (recurrent or ongoing AND present > 4 weeks)  Answer Assessment - Initial Assessment Questions 1. ONSET: "When did the pain begin?"      This has been going on for about 2 mos.  2. LOCATION: "Where does it hurt?" (upper, mid or lower back)     It aches in lower back into buttocks and back of thighs, bilaterally 3. SEVERITY: "How bad is the pain?"  (e.g., Scale 1-10; mild, moderate, or severe)   - MILD (1-3): doesn't interfere with normal activities    - MODERATE (4-7): interferes with normal activities or awakens from sleep    - SEVERE (8-10): excruciating pain, unable to do any normal activities      5-6/10; I continue to go and do things; I just have to stop and sit when I get the pain 4. PATTERN: "Is the pain constant?" (e.g., yes, no; constant, intermittent)      It comes and goes 5. RADIATION: "Does the pain shoot into your legs or elsewhere?"     Into buttocks and posterior thigh  6. CAUSE:  "What do you think is causing the back pain?"      "I was told it is sciatica." 7. BACK OVERUSE:  "Any recent lifting of heavy objects, strenuous work or exercise?"    Denied any undue strain to low back 8. MEDICATIONS: "What have you taken so far for the pain?" (e.g., nothing, acetaminophen, NSAIDS)     "Relief Factor" capsules for inflammation  9. NEUROLOGIC SYMPTOMS: "Do you have any weakness, numbness, or problems with bowel/bladder control?"    Feels intermittent electrical shock-like in left hip, and feels the left leg will give out; denied weakness, numbness / tingling of extremities .  Denied any bowel / bladder issues with control ; reported aching is only present with walking; if laying or sitting she is not in any discomfort 10. OTHER SYMPTOMS: "Do you have any other symptoms?" (e.g., fever, abdominal pain, burning with urination, blood in urine)       Denied redness/ warmth;  11. PREGNANCY: "Is there any chance you are pregnant?" (e.g., yes, no; LMP)       N/a  Protocols used: BACK PAIN-A-AH  Message from Conception Chancy, NT sent at 10/27/2018 11:05 AM EST   Patient is calling and states she is having sciatica pain in her lower back and it runs down the back of her legs to her knees and sometimes her left leg gives away. She would like to  know if Dr. Derrel Nip can help her with this or if she is needing to be seen somewhere else. Requesting call back from nurse.

## 2018-10-28 ENCOUNTER — Ambulatory Visit (INDEPENDENT_AMBULATORY_CARE_PROVIDER_SITE_OTHER): Payer: Medicare HMO

## 2018-10-28 ENCOUNTER — Ambulatory Visit (INDEPENDENT_AMBULATORY_CARE_PROVIDER_SITE_OTHER): Payer: Medicare HMO | Admitting: Family Medicine

## 2018-10-28 ENCOUNTER — Encounter: Payer: Self-pay | Admitting: Family Medicine

## 2018-10-28 VITALS — BP 122/68 | HR 85 | Temp 97.8°F | Ht 67.0 in | Wt 175.0 lb

## 2018-10-28 DIAGNOSIS — M5116 Intervertebral disc disorders with radiculopathy, lumbar region: Secondary | ICD-10-CM | POA: Diagnosis not present

## 2018-10-28 DIAGNOSIS — G8929 Other chronic pain: Secondary | ICD-10-CM | POA: Diagnosis not present

## 2018-10-28 DIAGNOSIS — M5442 Lumbago with sciatica, left side: Secondary | ICD-10-CM

## 2018-10-28 DIAGNOSIS — C9110 Chronic lymphocytic leukemia of B-cell type not having achieved remission: Secondary | ICD-10-CM | POA: Diagnosis not present

## 2018-10-28 DIAGNOSIS — M533 Sacrococcygeal disorders, not elsewhere classified: Secondary | ICD-10-CM | POA: Diagnosis not present

## 2018-10-28 LAB — COMPREHENSIVE METABOLIC PANEL
ALBUMIN: 4.4 g/dL (ref 3.5–5.2)
ALK PHOS: 45 U/L (ref 39–117)
ALT: 28 U/L (ref 0–35)
AST: 27 U/L (ref 0–37)
BUN: 24 mg/dL — AB (ref 6–23)
CALCIUM: 10.1 mg/dL (ref 8.4–10.5)
CO2: 30 mEq/L (ref 19–32)
Chloride: 103 mEq/L (ref 96–112)
Creatinine, Ser: 1.09 mg/dL (ref 0.40–1.20)
GFR: 51.09 mL/min — ABNORMAL LOW (ref >60.00)
Glucose, Bld: 132 mg/dL — ABNORMAL HIGH (ref 70–99)
POTASSIUM: 4.6 meq/L (ref 3.5–5.1)
SODIUM: 139 meq/L (ref 135–145)
TOTAL PROTEIN: 7.3 g/dL (ref 6.0–8.3)
Total Bilirubin: 0.5 mg/dL (ref 0.2–1.2)

## 2018-10-28 LAB — CBC
HEMATOCRIT: 44.2 % (ref 36.0–46.0)
HEMOGLOBIN: 14.7 g/dL (ref 12.0–15.0)
MCHC: 33.2 g/dL (ref 30.0–36.0)
MCV: 89.9 fl (ref 78.0–100.0)
PLATELETS: 276 10*3/uL (ref 150.0–400.0)
RBC: 4.92 Mil/uL (ref 3.87–5.11)
RDW: 13.1 % (ref 11.5–15.5)

## 2018-10-28 NOTE — Progress Notes (Signed)
Subjective:    Patient ID: Haley Love, female    DOB: 1933/12/23, 84 y.o.   MRN: [id number]  HPI   Patient presents to clinic complaining of chronic low back pain.  Patient states she has had this back pain off and on for many months, and it seems to be worse over the past 2 months.  Describes as an ache in the low back, left side seems worse lately.  Patient states she has seen the chiropractor for 5 sessions, chiropractor sessions did seem to help for short time, but pain returned.  States she feels fine when laying in bed or sitting in a chair, but when she has to stand for a extended period of time, she will notice the pain in low back and then the pain will go down into left buttock and down left leg.  Denies numbness or tingling.  Denies saddle anesthesia.  Denies loss of bowel or bladder control.  Patient does not like to take many pain medicines.  Takes an occasional Advil and also has ordered a natural supplement from the Internet called relief factor.  Patient states relief factors are advertised itself to help reduce inflammation and reduce pain.  Relief factor ingredients (copied from their web site): ICARIIN Helps maintain optimal production of nitric oxide for the support of blood flow and promotes tissue health. May support the natural response to everyday wear and tear by affecting the genetic expression of key enzymes.* RESVERATROL Potent free-radical neutralizer that offers broad-spectrum support for the body's normal and natural vascular response and supports blood vessel health. Promotes a healthy response to biological stress.* TURMERIC Provides Curcumin phytosome for enhanced absorption and bioavailability. Offers maximal support for the body's natural and healthy response to overexertion that can cause minor, but temporary discomfort. Supports the balanced activities of multiple enzyme systems.*  OMEGA-3 Highly concentrated EPA/DHA supports the maintenance of healthy  tissues and the normal expression of genes involved in the body's balanced immune response. Promotes the body's natural healing abilities.*  Patient also reports a history of chronic lymphocytic leukemia and is concerned that this could possibly be contributing to her low back pain  Patient Active Problem List   Diagnosis Date Noted  . CLL (chronic lymphocytic leukemia) ([place]) 09/20/2018  . 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 Hearing loss due to cerumen impaction, right 02/03/2018  . Statin intolerance 08/09/2017  . Pain of right hip joint 08/06/2017  . Encounter for preventive health examination 08/06/2017  . Diabetes mellitus type 2, diet-controlled ([place]) 03/01/2016  . Essential hypertension 02/27/2016  . Trigger finger, acquired 08/31/2015  . MGUS (monoclonal gammopathy of unknown significance) 08/19/2014  . CKD (chronic kidney disease), stage III ([place]) 08/13/2014  . Atypical lymphocytosis 08/13/2014  . Other malaise and fatigue 08/13/2014  . Malignant neoplasm of lower-outer quadrant of left breast of female, estrogen receptor positive ([place]) 10/31/2013  . S/P TKR (total knee replacement) 08/04/2013  . Encounter for Medicare annual wellness exam 08/04/2013  . History of scabies 08/03/2013  . Hyperlipidemia with target LDL less than 100 12/22/2012  . Tubular adenoma of colon 12/21/2012  . Dermatitis 12/14/2012   Social History   Tobacco Use  . Smoking status: Never Smoker  . Smokeless tobacco: Never Used  Substance Use Topics  . Alcohol use: Yes    Comment: 1 glass wine week   Review of Systems  Constitutional: Negative for chills, fatigue and fever.  HENT: Negative for congestion,  ear pain, sinus pain and sore throat.   Eyes: Negative.   Respiratory: Negative for cough, shortness of breath and wheezing.   Cardiovascular: Negative for chest pain, palpitations and leg swelling.  Gastrointestinal: Negative for  abdominal pain, diarrhea, nausea and vomiting.  Genitourinary: Negative for dysuria, frequency and urgency.  Musculoskeletal: +low back pain, left side more so.   Skin: Negative for color change, pallor and rash.  Neurological: Negative for syncope, light-headedness and headaches.  Psychiatric/Behavioral: The patient is not nervous/anxious.       Objective:   Physical Exam  Constitutional: She is oriented to person, place, and time. No distress.  HENT:  Head: Normocephalic and atraumatic.  Eyes: Pupils are equal, round, and reactive to light. Conjunctivae and EOM are normal. No scleral icterus.  Neck: Normal range of motion. Neck supple. No tracheal deviation present.  Cardiovascular: Normal rate and regular rhythm.  Pulmonary/Chest: Effort normal and breath sounds normal. No respiratory distress. She has no wheezes. She has no rales.  Abdominal: Soft. Bowel sounds are normal. She exhibits no distension. There is no tenderness.  Musculoskeletal:       Back:  Area of tenderness on low back indicated by red circle on diagram.  Patient does have pain on bilateral sides, but seems to go down on the left buttock cheek more so lately.  Range of motion intact, patient able to bend forward, backward/side to side/twist side to side without much issue.  Straight leg raises do not cause a pulling pain in low back.  Quadricep strength equal and strong.  Neurological: She is alert and oriented to person, place, and time. No cranial nerve deficit.  Skin: Skin is warm and dry. No pallor.  Psychiatric: She has a normal mood and affect. Her behavior is normal.  Nursing note and vitals reviewed.     Vitals:   10/28/18 1116  BP: 122/68  Pulse: 85  Temp: 97.8 F (36.6 C)  SpO2: 93%   Assessment & Plan:   Chronic low back pain with left-sided sciatica-we will get x-ray of lumbar spine and sacrum/coccyx area to further investigate vertebrae to see if arthritis is possibly at play causing pain.  Also  suggested possibility of doing physical therapy referral.  Patient will keep doing her daily back stretches and exercises to help calm pain.  After reading the ingredients relief factor, patient advised she can continue taking at this time and also she can use a Tylenol as needed for pain as well.  Also suggested she try topical muscle rub such as a Biofreeze or BenGay to help pain as well.  CLL- we will get CBC and CMP in clinic today.  According to patient she follows regularly with oncology due to her chronic lymphocytic leukemia, so we will get new lab work today to compare to old readings.  Patient will keep regularly scheduled follow-up with PCP as planned.  She is aware we will call her with results of x-ray and labs and we will proceed forward with next echo plan of care once we have these results.

## 2018-11-12 ENCOUNTER — Other Ambulatory Visit: Payer: Self-pay | Admitting: Internal Medicine

## 2019-01-26 ENCOUNTER — Telehealth: Payer: Self-pay | Admitting: Internal Medicine

## 2019-01-26 DIAGNOSIS — H905 Unspecified sensorineural hearing loss: Secondary | ICD-10-CM

## 2019-01-26 NOTE — Telephone Encounter (Signed)
Referral is in process as requested 

## 2019-01-26 NOTE — Telephone Encounter (Signed)
Spoke with pt to let her know that the referral to Dr. Tami Ribas has been placed. Also advised pt that if she does not hear anything from their office in the next week to please give [place] a call back so we can check on the referral. Pt gave a verbal understanding.

## 2019-01-26 NOTE — Telephone Encounter (Signed)
Copied from [place] [id number]. Topic: General - Other >> Jan 26, 2019  8:49 AM Keene Breath wrote: Reason for CRM: Patient called to request a referral for a ENT, Dr. Donnella Bi.  Patient was told that she needed a hearing aid so she was not sure if she needed a referral to a specialist first.  Please advise and call patient when the referral has been placed.  CB# [phone]

## 2019-01-26 NOTE — Telephone Encounter (Signed)
Patient called back regarding referral also a missed call. Per patient please call her on her home phone. Ph# [phone]

## 2019-01-28 ENCOUNTER — Telehealth: Payer: Self-pay | Admitting: *Deleted

## 2019-01-28 NOTE — Telephone Encounter (Signed)
Copied from [place] [id number]. Topic: Appointment Scheduling - Scheduling Inquiry for Clinic >> Jan 28, 2019 11:59 AM Scherrie Gerlach wrote: Reason for CRM: pt states she always has labs done prior to her appt with Dr Derrel Nip.  Pt has appt with Dr Derrel Nip on Wed, March 11. Pt wants to come in Monday if [facility], and wants to know if fasting or not.

## 2019-01-31 NOTE — Telephone Encounter (Signed)
Spoke with pt and apologized for just getting this message. Pt is just going to come to her appt fasting for lab work. Pt was advised that since her appt is in the afternoon that she could go ahead and eat breakfast but to wait until after her appt to have lunch because Dr. Derrel Nip only requires 4 hours for fasting. Pt gave a verbal understanding.

## 2019-02-02 ENCOUNTER — Ambulatory Visit (INDEPENDENT_AMBULATORY_CARE_PROVIDER_SITE_OTHER): Payer: Medicare HMO | Admitting: Internal Medicine

## 2019-02-02 ENCOUNTER — Encounter: Payer: Self-pay | Admitting: Internal Medicine

## 2019-02-02 ENCOUNTER — Ambulatory Visit (INDEPENDENT_AMBULATORY_CARE_PROVIDER_SITE_OTHER): Payer: Medicare HMO

## 2019-02-02 ENCOUNTER — Other Ambulatory Visit: Payer: Self-pay

## 2019-02-02 VITALS — BP 132/70 | HR 85 | Temp 98.2°F | Resp 16 | Ht 67.0 in | Wt 173.4 lb

## 2019-02-02 VITALS — BP 132/70 | HR 85 | Temp 98.2°F | Wt 173.4 lb

## 2019-02-02 DIAGNOSIS — E119 Type 2 diabetes mellitus without complications: Secondary | ICD-10-CM | POA: Diagnosis not present

## 2019-02-02 DIAGNOSIS — Z0000 Encounter for general adult medical examination without abnormal findings: Secondary | ICD-10-CM | POA: Diagnosis not present

## 2019-02-02 DIAGNOSIS — E785 Hyperlipidemia, unspecified: Secondary | ICD-10-CM

## 2019-02-02 DIAGNOSIS — N183 Chronic kidney disease, stage 3 unspecified: Secondary | ICD-10-CM

## 2019-02-02 DIAGNOSIS — I10 Essential (primary) hypertension: Secondary | ICD-10-CM

## 2019-02-02 DIAGNOSIS — C9110 Chronic lymphocytic leukemia of B-cell type not having achieved remission: Secondary | ICD-10-CM

## 2019-02-02 MED ORDER — TELMISARTAN 20 MG PO TABS: 20.0000 mg | ORAL_TABLET | Freq: Every day | ORAL | 1 refills | Status: DC

## 2019-02-02 MED ORDER — ZOSTER VAC RECOMB ADJUVANTED 50 MCG/0.5ML IM SUSR: 0.5000 mL | Freq: Once | INTRAMUSCULAR | 1 refills | Status: AC

## 2019-02-02 NOTE — Patient Instructions (Addendum)
  Ms. Placido , Thank you for taking time to come for your Medicare Wellness Visit. I appreciate your ongoing commitment to your health goals. Please review the following plan we discussed and let me know if I can assist you in the future.   These are the goals we discussed: Goals      Patient Stated   . Increase physical activity (pt-stated)     Walk for exercise as tolerated       This is a list of the screening recommended for you and due dates:  Health Maintenance  Topic Date Due  . Eye exam for diabetics  11/21/1944  . Pneumonia vaccines (1 of 2 - PCV13) 11/21/2001  . Flu Shot  03/30/2019*  . Hemoglobin A1C  02/04/2019  . Complete foot exam   08/11/2019  . Tetanus Vaccine  05/20/2027  . DEXA scan (bone density measurement)  Completed  *Topic was postponed. The date shown is not the original due date.

## 2019-02-02 NOTE — Patient Instructions (Signed)
You have a small bit of wax that is resting on or adhered to your left ear drum  I recommend trying your home earwax kit first before seeing Dr Tami Ribas; it may work     I am making a decision to change your lisinopril to telmisartan, based on increased reports by one of my ENT colleagues of patients  developing tongue and throat swelling from lisinopril.  The condition , called "angioedema," can be fatal if a person's airway is compromised.  I also want you to take it at night instead of morning,  s recent studies have shown that taking your blood pressure medications at night protects you better from heart attacks and strokes.   There  Is no need to check your CBC today since it had  not changed in the previous 6 months   If your kidney function  Had declined on today's labs,  It may be from your back pain medication and I will advise you to suspend it for 2 weeks and repeat the blood test

## 2019-02-02 NOTE — Progress Notes (Signed)
Subjective:  Patient ID: Haley Love, female    DOB: 1934-05-13  Age: 85 y.o. MRN: [id number]  CC: The primary encounter diagnosis was CKD (chronic kidney disease) stage 3, GFR 30-59 ml/min ([place]). Diagnoses of Hyperlipidemia with target LDL less than 100, Essential hypertension, Diabetes mellitus type 2, diet-controlled ([place]), CKD (chronic kidney disease), stage III ([place]), and CLL (chronic lymphocytic leukemia) ([place]) were also pertinent to this visit.  HPI Haley Love presents for 6 month follow up on chronic conditions inculing Type 2 DM CKd and hyperlipidemia.   Has seen Magrin[facility] for BRCA and CLL follow up. Repeat labs in Sept 2020 planned.   CKD : managed with  lisinopril . avoiding NSAIDS  but akes turmeric daily and rarely  advil. Has been using a :natural supplement called "Relief factor" ; takes 8 capsules daily" not sure what is in it.  Also using a heating pad for MSK pain .  Hyperlipidemia:  She has been out of cholestine (RYRY ) bc health food store is out of it   3 month follow up on diabetes.  Patient has no complaints today.  Patient is following a low glycemic index diet.  She does not check her blood sugars regularly Patient is exercising about 3 times per week and intentionally trying to lose weight .  Patient has had an eye exam in the last 12 months and checks feet regularly for signs of infection.  Patient does not walk barefoot outside,  And denies an numbness tingling or burning in feet. Patient is up to date on all recommended vaccinations   Outpatient Medications Prior to Visit  Medication Sig Dispense Refill  . aspirin 81 MG tablet Take 81 mg by mouth daily.    Marland Kitchen b complex vitamins tablet Take 1 tablet by mouth daily.    . B Complex-C (RA B-COMPLEX/VITAMIN C CR) TBCR Take by mouth.    Chong Sicilian, Borago officinalis, (BORAGE OIL PO) Take 1,000 mg by mouth daily.    . Calcium Carbonate-Vit D-Min (RA CALCIUM 600/VIT D/MINERALS) 600-200 MG-UNIT TABS Take tablets by  mouth once a day.    . Cholecalciferol (VITAMIN D-3) 1000 UNITS CAPS Take 1 capsule by mouth at bedtime.    Marland Kitchen CINNAMON PO Take 2,000 mg by mouth at bedtime.    . Coenzyme Q10 (CO Q-10) 200 MG CAPS Take 1 capsule by mouth at bedtime.    . Collagenase POWD by Does not apply route daily at 6 (six) AM.    . doxycycline (VIBRA-TABS) 100 MG tablet Take 1 tablet (100 mg total) by mouth 2 (two) times daily. With food 14 tablet 0  . Flaxseed, Linseed, (FLAX SEED OIL) 1000 MG CAPS Take by mouth.    . Indole-3-Carbinol POWD Take 200 mg by mouth 2 (two) times daily.    Javier Docker Oil 1500 MG CAPS Take by mouth.    . Magnesium 500 MG CAPS Take 1 capsule by mouth daily.     . Milk Thistle 300 MG CAPS Take by mouth daily at 6 (six) AM.    . Misc Natural Products (NARCOSOFT HERBAL LAX PO) Take 2 tablets by mouth at bedtime.     . mupirocin ointment (BACTROBAN) 2 % Apply 1 application topically 2 (two) times daily. 30 g 0  . Nutritional Supplements (NUTRITIONAL SUPPLEMENT PO) Take 3 capsules by mouth 2 (two) times daily. Neprinol AFD>    . Red Yeast Rice Extract (RED YEAST RICE PO) Take 1,000 mg by mouth 2 (  two) times daily.    . TDaP (BOOSTRIX) 5-2.5-18.5 LF-MCG/0.5 injection Inject 0.5 mLs into the muscle once. 0.5 mL 0  . vitamin E 400 UNIT capsule Take 400 Units by mouth daily.    Marland Kitchen lisinopril-hydrochlorothiazide (PRINZIDE,ZESTORETIC) 10-12.5 MG tablet TAKE 1 TABLET BY MOUTH EVERY DAY 90 tablet 1   No facility-administered medications prior to visit.     Review of Systems;  Patient denies headache, fevers, malaise, unintentional weight loss, skin rash, eye pain, sinus congestion and sinus pain, sore throat, dysphagia,  hemoptysis , cough, dyspnea, wheezing, chest pain, palpitations, orthopnea, edema, abdominal pain, nausea, melena, diarrhea, constipation, flank pain, dysuria, hematuria, urinary  Frequency, nocturia, numbness, tingling, seizures,  Focal weakness, Loss of consciousness,  Tremor, insomnia,  depression, anxiety, and suicidal ideation.      Objective:  BP 132/70   Pulse 85   Temp 98.2 F (36.8 C) (Oral)   Wt 173 lb 6.4 oz (78.7 kg)   SpO2 96%   BMI 27.16 kg/m   BP Readings from Last 3 Encounters:  02/02/19 132/70  02/02/19 132/70  10/28/18 122/68    Wt Readings from Last 3 Encounters:  02/02/19 173 lb 6.4 oz (78.7 kg)  02/02/19 173 lb 6.4 oz (78.7 kg)  10/28/18 175 lb (79.4 kg)    General appearance: alert, cooperative and appears stated age Ears: normal TM's and external ear canals both ears Throat: lips, mucosa, and tongue normal; teeth and gums normal Neck: no adenopathy, no carotid bruit, supple, symmetrical, trachea midline and thyroid not enlarged, symmetric, no tenderness/mass/nodules Back: symmetric, no curvature. ROM normal. No CVA tenderness. Lungs: clear to auscultation bilaterally Heart: regular rate and rhythm, S1, S2 normal, no murmur, click, rub or gallop Abdomen: soft, non-tender; bowel sounds normal; no masses,  no organomegaly Pulses: 2+ and symmetric Skin: Skin color, texture, turgor normal. No rashes or lesions Lymph nodes: Cervical, supraclavicular, and axillary nodes normal.  Lab Results  Component Value Date   HGBA1C 6.5 08/06/2018   HGBA1C 6.7 (H) 02/01/2018   HGBA1C 6.6 (H) 08/05/2017    Lab Results  Component Value Date   CREATININE 1.14 02/02/2019   CREATININE 1.09 10/28/2018   CREATININE 1.24 (H) 08/06/2018    Lab Results  Component Value Date   WBC 23.5 Repeated and verified X2. (HH) 10/28/2018   HGB 14.7 10/28/2018   HCT 44.2 10/28/2018   PLT 276.0 10/28/2018   GLUCOSE 97 02/02/2019   CHOL 203 (H) 08/06/2018   TRIG 214.0 (H) 08/06/2018   HDL 38.90 (L) 08/06/2018   LDLDIRECT 139.0 08/06/2018   LDLCALC 109 (H) 02/01/2018   ALT 29 02/02/2019   AST 28 02/02/2019   NA 138 02/02/2019   K 4.6 02/02/2019   CL 101 02/02/2019   CREATININE 1.14 02/02/2019   BUN 35 (H) 02/02/2019   CO2 27 02/02/2019   TSH 1.44  02/01/2018   INR 1.0 05/31/2014   HGBA1C 6.5 08/06/2018    Mm 3d Screen Breast Bilateral  Result Date: 07/27/2018 CLINICAL DATA:  Screening. EXAM: DIGITAL SCREENING BILATERAL MAMMOGRAM WITH TOMO AND CAD COMPARISON:  Previous exam(s). ACR Breast Density Category b: There are scattered areas of fibroglandular density. FINDINGS: There are no findings suspicious for malignancy. Images were processed with CAD. IMPRESSION: No mammographic evidence of malignancy. A result letter of this screening mammogram will be mailed directly to the patient. RECOMMENDATION: Screening mammogram in one year. (Code:SM-B-01Y) BI-RADS CATEGORY  1: Negative. Electronically Signed   By: Ammie Ferrier M.D.   On:  07/27/2018 11:18    Assessment & Plan:   Problem List Items Addressed This Visit    Hyperlipidemia with target LDL less than 100    Her ten year risk of CAD is > 25% and she has aortic atherosclerosis on plain films.  Statin advised but not tolerated,  Using red yeast rice .   Lab Results  Component Value Date   CHOL 203 (H) 08/06/2018   HDL 38.90 (L) 08/06/2018   LDLCALC 109 (H) 02/01/2018   LDLDIRECT 139.0 08/06/2018   TRIG 214.0 (H) 08/06/2018   CHOLHDL 5 08/06/2018         Relevant Medications   telmisartan (MICARDIS) 20 MG tablet   Other Relevant Orders   Lipid panel   Essential hypertension    I am making a decision to change patient's lisinopril to telmisartan, based on increased anecdotal reports of life threateneng angioedema with ACE Inhibitors .  I also advised patient to take it at night instead of morning,  as recent studies have shown a favorable effect on CAD and CVA rates in those who do       Relevant Medications   telmisartan (MICARDIS) 20 MG tablet   Diabetes mellitus type 2, diet-controlled ([place])    Well controlled with diet alone. Continue  low glycemic index diet utilizing smaller more frequent meals to increase metabolism.  I have also recommended that patient resume  exercising with a goal of 30 minutes of aerobic exercise a minimum of 5 days per week.   Lab Results  Component Value Date   HGBA1C 6.5 08/06/2018   No results found for: MICROALBUR, [id number]        Relevant Medications   telmisartan (MICARDIS) 20 MG tablet   Other Relevant Orders   Hemoglobin A1c   CLL (chronic lymphocytic leukemia) ([place])   Relevant Orders   CBC with Differential/Platelet   CKD (chronic kidney disease), stage III ([facility])    Stable.  referral to  Nephrology was done last year and labs reviewed.  Reminded to avoid daily use of aleve and other NSAIDs . No significant change since starting an OTC joint analgesic called "relief factor"     .lastcr Lab Results  Component Value Date   CREATININE 1.14 02/02/2019            Other Visit Diagnoses    CKD (chronic kidney disease) stage 3, GFR 30-59 ml/min ([facility])    -  Primary   Relevant Orders   Comprehensive metabolic panel (Completed)   Comprehensive metabolic panel      I have discontinued Elyssia F. Storts's lisinopril-hydrochlorothiazide. I am also having her start on telmisartan and Zoster Vaccine Adjuvanted. Additionally, I am having her maintain her aspirin, CINNAMON PO, Misc Natural Products (NARCOSOFT HERBAL LAX PO), Co Q-10, Vitamin D-3, Flax Seed Oil, vitamin E, b complex vitamins, Krill Oil, Red Yeast Rice Extract (RED YEAST RICE PO), Magnesium, (Borage, Borago officinalis, (BORAGE OIL PO)), Tdap, RA B-Complex/Vitamin C CR, RA Calcium 600/Vit D/Minerals, Indole-3-Carbinol, Nutritional Supplements (NUTRITIONAL SUPPLEMENT PO), Milk Thistle, Collagenase, doxycycline, and mupirocin ointment.  Meds ordered this encounter  Medications  . telmisartan (MICARDIS) 20 MG tablet    Sig: Take 1 tablet (20 mg total) by mouth at bedtime.    Dispense:  90 tablet    Refill:  1  . Zoster Vaccine Adjuvanted [facility]) injection    Sig: Inject 0.5 mLs into the muscle once for 1 dose.    Dispense:  1 each    Refill:  1     Medications Discontinued During This Encounter  Medication Reason  . lisinopril-hydrochlorothiazide (PRINZIDE,ZESTORETIC) 10-12.5 MG tablet     Follow-up: Return in about 6 months (around 08/05/2019).   Crecencio Mc, MD

## 2019-02-02 NOTE — Progress Notes (Addendum)
Subjective:   Haley Love is a 85 y.o. female who presents for Medicare Annual (Subsequent) preventive examination.  Review of Systems:  No ROS.  Medicare Wellness Visit. Additional risk factors are reflected in the social history. Cardiac Risk Factors include: advanced age (>51men, >39 women);hypertension;diabetes mellitus     Objective:     Vitals: BP 132/70 (Patient Position: Sitting, Cuff Size: Normal)   Pulse 85   Temp 98.2 F (36.8 C) (Oral)   Resp 16   Ht 5\' 7"  (1.702 m)   Wt 173 lb 6.4 oz (78.7 kg)   SpO2 96%   BMI 27.16 kg/m   Body mass index is 27.16 kg/m.  Advanced Directives 02/02/2019 01/29/2018 01/28/2017 07/31/2015 02/19/2015 08/06/2012 08/02/2012  Does Patient Have a Medical Advance Directive? Yes Yes Yes Yes Yes - Patient has advance directive, copy not in chart  Type of Advance Directive [place];Living will [place];Living will [place];Living will - - - -  Does patient want to make changes to medical advance directive? No - Patient declined No - Patient declined No - Patient declined - - - -  Copy of [place] in Chart? No - copy requested No - copy requested No - copy requested - - - -  Pre-existing out of facility DNR order (yellow form or pink MOST form) - - - - - No -    Tobacco Social History   Tobacco Use  Smoking Status Never Smoker  Smokeless Tobacco Never Used     Counseling given: Not Answered   Clinical Intake:  Pre-visit preparation completed: Yes  Pain : No/denies pain     Diabetes: Yes  How often do you need to have someone help you when you read instructions, pamphlets, or other written materials from your doctor or pharmacy?: 1 - Never  Interpreter Needed?: No     Past Medical History:  Diagnosis Date  . Breast cancer ([place])   . Cataract   . Chickenpox   . Contact lens/glasses fitting   . Diabetes mellitus without complication ([place])   .  Hyperlipidemia   . Hypertension   . Inflammatory polyps of colon [facility])    Past Surgical History:  Procedure Laterality Date  . BREAST LUMPECTOMY Left 07/28/2012  . BREAST SURGERY  9/13   lt lump  . cataracts  2014  . COLONOSCOPY WITH PROPOFOL N/A 01/05/2017   Procedure: COLONOSCOPY WITH PROPOFOL;  Surgeon: Manya Silvas, MD;  Location: [facility] ENDOSCOPY;  Service: Endoscopy;  Laterality: N/A;  . JOINT REPLACEMENT Right July 2015   Hooten, right knee  . KNEE ARTHROSCOPY W/ MENISCAL REPAIR  09/23/11   Right  . MASTECTOMY, PARTIAL  08/06/2012   Procedure: MASTECTOMY PARTIAL;  Surgeon: Adin Hector, MD;  Location: [place];  Service: General;  Laterality: Left;  Left partial mastectomy with re-excision of margins   Family History  Problem Relation Age of Onset  . Lung cancer Mother   . Arthritis Mother   . Hyperlipidemia Mother   . Hypertension Mother   . Throat cancer Father   . Hyperlipidemia Father   . Cancer Paternal Aunt        breast  . Dementia Brother   . Leukemia Brother        lymphatic /CLL   Social History   Socioeconomic History  . Marital status: Divorced    Spouse name: Not on file  . Number of children: Not  on file  . Years of education: Not on file  . Highest education level: Not on file  Occupational History  . Not on file  Social Needs  . Financial resource strain: Not hard at all  . Food insecurity:    Worry: Never true    Inability: Never true  . Transportation needs:    Medical: No    Non-medical: No  Tobacco Use  . Smoking status: Never Smoker  . Smokeless tobacco: Never Used  Substance and Sexual Activity  . Alcohol use: Yes    Comment: 1 glass wine week  . Drug use: No  . Sexual activity: Never    Birth control/protection: Post-menopausal  Lifestyle  . Physical activity:    Days per week: 0 days    Minutes per session: Not on file  . Stress: Not at all  Relationships  . Social connections:    Talks on phone:  Not on file    Gets together: Not on file    Attends religious service: Not on file    Active member of club or organization: Not on file    Attends meetings of clubs or organizations: Not on file    Relationship status: Not on file  Other Topics Concern  . Not on file  Social History Narrative   Lives alone ,      Outpatient Encounter Medications as of 02/02/2019  Medication Sig  . aspirin 81 MG tablet Take 81 mg by mouth daily.  Marland Kitchen b complex vitamins tablet Take 1 tablet by mouth daily.  . B Complex-C (RA B-COMPLEX/VITAMIN C CR) TBCR Take by mouth.  Chong Sicilian, Borago officinalis, (BORAGE OIL PO) Take 1,000 mg by mouth daily.  . Calcium Carbonate-Vit D-Min (RA CALCIUM 600/VIT D/MINERALS) 600-200 MG-UNIT TABS Take tablets by mouth once a day.  . Cholecalciferol (VITAMIN D-3) 1000 UNITS CAPS Take 1 capsule by mouth at bedtime.  Marland Kitchen CINNAMON PO Take 2,000 mg by mouth at bedtime.  . Coenzyme Q10 (CO Q-10) 200 MG CAPS Take 1 capsule by mouth at bedtime.  . Collagenase POWD by Does not apply route daily at 6 (six) AM.  . doxycycline (VIBRA-TABS) 100 MG tablet Take 1 tablet (100 mg total) by mouth 2 (two) times daily. With food  . Flaxseed, Linseed, (FLAX SEED OIL) 1000 MG CAPS Take by mouth.  . Indole-3-Carbinol POWD Take 200 mg by mouth 2 (two) times daily.  Javier Docker Oil 1500 MG CAPS Take by mouth.  . Magnesium 500 MG CAPS Take 1 capsule by mouth daily.   . Milk Thistle 300 MG CAPS Take by mouth daily at 6 (six) AM.  . Misc Natural Products (NARCOSOFT HERBAL LAX PO) Take 2 tablets by mouth at bedtime.   . mupirocin ointment (BACTROBAN) 2 % Apply 1 application topically 2 (two) times daily.  . Nutritional Supplements (NUTRITIONAL SUPPLEMENT PO) Take 3 capsules by mouth 2 (two) times daily. Neprinol AFD>  . Red Yeast Rice Extract (RED YEAST RICE PO) Take 1,000 mg by mouth 2 (two) times daily.  . TDaP (BOOSTRIX) 5-2.5-18.5 LF-MCG/0.5 injection Inject 0.5 mLs into the muscle once.  Marland Kitchen telmisartan  (MICARDIS) 20 MG tablet Take 1 tablet (20 mg total) by mouth at bedtime.  . vitamin E 400 UNIT capsule Take 400 Units by mouth daily.  Marland Kitchen Zoster Vaccine Adjuvanted [facility]) injection Inject 0.5 mLs into the muscle once for 1 dose.   No facility-administered encounter medications on file as of 02/02/2019.     Activities  of Daily Living In your present state of health, do you have any difficulty performing the following activities: 02/02/2019  Hearing? Y  Comment HOH. Followed by Tami Ribas  Vision? N  Difficulty concentrating or making decisions? N  Walking or climbing stairs? N  Dressing or bathing? N  Doing errands, shopping? N  Preparing Food and eating ? N  Using the Toilet? N  In the past six months, have you accidently leaked urine? N  Do you have problems with loss of bowel control? N  Managing your Medications? N  Managing your Finances? N  Housekeeping or managing your Housekeeping? N  Some recent data might be hidden    Patient Care Team: Crecencio Mc, MD as PCP - General (Internal Medicine) Magrinat, Virgie Dad, MD as Consulting Physician (Oncology)    Assessment:   This is a routine wellness examination for Marylynn.  Health Screenings  Mammogram -07/27/18 Colonoscopy -01/05/17 Bone Density -07/23/16 Glaucoma -none Hearing -difficulty hearing conversational tones. She plans to follow with ENT. Hemoglobin A1C -08/06/18 Cholesterol -08/06/18  Social  Alcohol intake yes, 1 glass per week Smoking history- never Smokers in home? Yes, husband Illicit drug use? none Exercise -none. She plans to start walking for exercise.  Diet -regular Sexually [place]  Safety  Patient feels safe at home.  Patient does have smoke detectors at home  Patient does wear sunscreen or protective clothing when in direct sunlight. Patient does wear seat belt when driving or riding with others.  Activities of Daily Living Patient can do their own household chores. Denies needing  assistance with: driving, feeding themselves, getting from bed to chair, getting to the toilet, bathing/showering, dressing, managing money, climbing flight of stairs, or preparing meals.   Depression Screen Patient denies losing interest in daily life, feeling hopeless, or crying easily over simple problems.   Fall Screen Patient denies being afraid of falling or falling in the last year.   Memory Screen Patient denies problems with memory, misplacing items, and is able to balance checkbook/bank accounts.  Patient is alert, normal appearance, oriented to person/place/and time. Correctly identified the president of the [place], recall of 2/3 objects, and performing simple calculations.  Patient displays appropriate judgement and can read correct time from watch face.   Immunizations The following Immunizations were discussed: Influenza, shingles, pneumonia, and tetanus.   Other Providers Patient Care Team: Crecencio Mc, MD as PCP - General (Internal Medicine) Magrinat, Virgie Dad, MD as Consulting Physician (Oncology)  Exercise Activities and Dietary recommendations Current Exercise Habits: The patient does not participate in regular exercise at present  Goals      Patient Stated   . Increase physical activity (pt-stated)     Walk for exercise as tolerated       Fall Risk Fall Risk  02/02/2019 08/13/2018 01/29/2018 01/28/2017 08/29/2015  Falls in the past year? 0 No No No No   Depression Screen PHQ 2/9 Scores 02/02/2019 08/13/2018 01/29/2018 01/28/2017  PHQ - 2 Score 0 0 0 0     Cognitive Function MMSE - Mini Mental State Exam 01/28/2017  Orientation to time 5  Orientation to Place 5  Registration 3  Attention/ Calculation 5  Recall 3  Language- name 2 objects 2  Language- repeat 1  Language- follow 3 step command 3  Language- read & follow direction 1  Write a sentence 1  Copy design 1  Total score 30     6CIT Screen 02/02/2019 01/29/2018  What Year? 0 points 0 points  What  month? 0 points 0 points  What time? 0 points 0 points  Count back from 20 0 points 0 points  Months in reverse 0 points 0 points  Repeat phrase 0 points 0 points  Total Score 0 0    Immunization History  Administered Date(s) Administered  . Tdap 05/19/2017  . Zoster 12/22/2011   Screening Tests Health Maintenance  Topic Date Due  . OPHTHALMOLOGY EXAM  11/21/1944  . PNA vac Low Risk Adult (1 of 2 - PCV13) 11/21/2001  . INFLUENZA VACCINE  03/30/2019 (Originally 06/24/2018)  . HEMOGLOBIN A1C  02/04/2019  . FOOT EXAM  08/11/2019  . TETANUS/TDAP  05/20/2027  . DEXA SCAN  Completed      Plan:    End of life planning; Advance aging; Advanced directives discussed. Copy of current HCPOA/Living Will requested.    I have personally reviewed and noted the following in the patient's chart:   . Medical and social history . Use of alcohol, tobacco or illicit drugs  . Current medications and supplements . Functional ability and status . Nutritional status . Physical activity . Advanced directives . List of other physicians . Hospitalizations, surgeries, and ER visits in previous 12 months . Vitals . Screenings to include cognitive, depression, and falls . Referrals and appointments  In addition, I have reviewed and discussed with patient certain preventive protocols, quality metrics, and best practice recommendations. A written personalized care plan for preventive services as well as general preventive health recommendations were provided to patient.     OBrien-Blaney, Rector Devonshire L, LPN  3/76/2831    I have reviewed the above information and agree with above.   Deborra Medina, MD

## 2019-02-03 LAB — COMPREHENSIVE METABOLIC PANEL
ALT: 29 U/L (ref 0–35)
AST: 28 U/L (ref 0–37)
Albumin: 4.5 g/dL (ref 3.5–5.2)
Alkaline Phosphatase: 50 U/L (ref 39–117)
BUN: 35 mg/dL — ABNORMAL HIGH (ref 6–23)
CO2: 27 mEq/L (ref 19–32)
Calcium: 10.2 mg/dL (ref 8.4–10.5)
Chloride: 101 mEq/L (ref 96–112)
Creatinine, Ser: 1.14 mg/dL (ref 0.40–1.20)
GFR: 45.61 mL/min — AB (ref >60.00)
Glucose, Bld: 97 mg/dL (ref 70–99)
Potassium: 4.6 mEq/L (ref 3.5–5.1)
Sodium: 138 mEq/L (ref 135–145)
Total Bilirubin: 0.6 mg/dL (ref 0.2–1.2)
Total Protein: 7.5 g/dL (ref 6.0–8.3)

## 2019-02-05 NOTE — Assessment & Plan Note (Signed)
I am making a decision to change patient's lisinopril to telmisartan, based on increased anecdotal reports of life threateneng angioedema with ACE Inhibitors .  I also advised patient to take it at night instead of morning,  as recent studies have shown a favorable effect on CAD and CVA rates in those who do

## 2019-02-05 NOTE — Assessment & Plan Note (Signed)
Her ten year risk of CAD is > 25% and she has aortic atherosclerosis on plain films.  Statin advised but not tolerated,  Using red yeast rice .   Lab Results  Component Value Date   CHOL 203 (H) 08/06/2018   HDL 38.90 (L) 08/06/2018   LDLCALC 109 (H) 02/01/2018   LDLDIRECT 139.0 08/06/2018   TRIG 214.0 (H) 08/06/2018   CHOLHDL 5 08/06/2018

## 2019-02-05 NOTE — Assessment & Plan Note (Signed)
Stable.  referral to  Nephrology was done last year and labs reviewed.  Reminded to avoid daily use of aleve and other NSAIDs . No significant change since starting an OTC joint analgesic called "relief factor"     .lastcr Lab Results  Component Value Date   CREATININE 1.14 02/02/2019

## 2019-02-05 NOTE — Assessment & Plan Note (Signed)
Well controlled with diet alone. Continue  low glycemic index diet utilizing smaller more frequent meals to increase metabolism.  I have also recommended that patient resume exercising with a goal of 30 minutes of aerobic exercise a minimum of 5 days per week.   Lab Results  Component Value Date   HGBA1C 6.5 08/06/2018   No results found for: Derl Barrow

## 2019-05-13 ENCOUNTER — Other Ambulatory Visit (INDEPENDENT_AMBULATORY_CARE_PROVIDER_SITE_OTHER): Payer: Medicare HMO

## 2019-05-13 ENCOUNTER — Telehealth: Payer: Self-pay | Admitting: *Deleted

## 2019-05-13 ENCOUNTER — Telehealth: Payer: Self-pay | Admitting: Family

## 2019-05-13 ENCOUNTER — Ambulatory Visit (INDEPENDENT_AMBULATORY_CARE_PROVIDER_SITE_OTHER): Payer: Medicare HMO

## 2019-05-13 ENCOUNTER — Other Ambulatory Visit: Payer: Self-pay | Admitting: Family

## 2019-05-13 ENCOUNTER — Other Ambulatory Visit: Payer: Self-pay

## 2019-05-13 ENCOUNTER — Ambulatory Visit (INDEPENDENT_AMBULATORY_CARE_PROVIDER_SITE_OTHER): Payer: Medicare HMO | Admitting: Family

## 2019-05-13 ENCOUNTER — Encounter: Payer: Self-pay | Admitting: Family

## 2019-05-13 DIAGNOSIS — M79675 Pain in left toe(s): Secondary | ICD-10-CM

## 2019-05-13 DIAGNOSIS — M19072 Primary osteoarthritis, left ankle and foot: Secondary | ICD-10-CM | POA: Diagnosis not present

## 2019-05-13 DIAGNOSIS — M109 Gout, unspecified: Secondary | ICD-10-CM | POA: Insufficient documentation

## 2019-05-13 LAB — CBC WITH DIFFERENTIAL/PLATELET
Basophils Absolute: 0.2 10*3/uL — ABNORMAL HIGH (ref 0.0–0.1)
Basophils Relative: 0.6 % (ref 0.0–3.0)
Eosinophils Absolute: 0.1 10*3/uL (ref 0.0–0.7)
Eosinophils Relative: 0.3 % (ref 0.0–5.0)
HCT: 41.5 % (ref 36.0–46.0)
Hemoglobin: 14 g/dL (ref 12.0–15.0)
Lymphocytes Relative: 84.8 % — ABNORMAL HIGH (ref 12.0–46.0)
Lymphs Abs: 31.6 10*3/uL — ABNORMAL HIGH (ref 0.7–4.0)
MCHC: 33.6 g/dL (ref 30.0–36.0)
MCV: 90.8 fl (ref 78.0–100.0)
Monocytes Absolute: 0.5 10*3/uL (ref 0.1–1.0)
Monocytes Relative: 1.3 % — ABNORMAL LOW (ref 3.0–12.0)
Neutro Abs: 4.8 10*3/uL (ref 1.4–7.7)
Neutrophils Relative %: 13 % — ABNORMAL LOW (ref 43.0–77.0)
Platelets: 226 10*3/uL (ref 150.0–400.0)
RBC: 4.57 Mil/uL (ref 3.87–5.11)
RDW: 13.3 % (ref 11.5–15.5)
WBC: 37.2 10*3/uL (ref 4.0–10.5)

## 2019-05-13 LAB — C-REACTIVE PROTEIN: CRP: <1 mg/dL (ref 0.5–20.0)

## 2019-05-13 LAB — SEDIMENTATION RATE: Sed Rate: 6 mm/hr (ref 0–30)

## 2019-05-13 LAB — URIC ACID: Uric Acid, Serum: 7.1 mg/dL — ABNORMAL HIGH (ref 2.4–7.0)

## 2019-05-13 MED ORDER — DOXYCYCLINE HYCLATE 100 MG PO TABS: 100.0000 mg | ORAL_TABLET | Freq: Two times a day (BID) | ORAL | 0 refills | Status: DC

## 2019-05-13 NOTE — Telephone Encounter (Signed)
Discussed WBCs with Tullo, supervising. We felt not significantly over baseline  I have advised patient that I think gout may be more likely. She will maintain close vigilance and follow up with PCP Monday.  I have sent a note to patient's oncologist as well in regards to further action needed.

## 2019-05-13 NOTE — Patient Instructions (Signed)
Start doxycycline  Stay out of the sun   Ensure to take probiotics while on antibiotics and also for 2 weeks after completion. It is important to re-colonize the gut with good bacteria and also to prevent any diarrheal infections associated with antibiotic use.    Follow up with Dr Derrel Nip on Monday 05/16/19 at 11am.

## 2019-05-13 NOTE — Addendum Note (Signed)
Addended by: Leeanne Rio on: 05/13/2019 01:58 PM   Modules accepted: Orders

## 2019-05-13 NOTE — Telephone Encounter (Signed)
Spoke with patient in regards labs Advised looks like possible gout WBCs are not significantly higher; however will message her oncologist so he is aware.   Pain has resolved. Has been walking Some redness. Tenderness has improved.   Declines prednisone. Will decide on doxycycline if pain doesn't improve.   She will follow up with Tullo on Monday.

## 2019-05-13 NOTE — Telephone Encounter (Signed)
CRITICAL VALUE STICKER  CRITICAL VALUE: [id number]  RECEIVER (on-site recipient of call): Jari Favre, CMA/XT  DATE & TIME NOTIFIED: 05/13/19 @ 3:50pm  MESSENGER (representative from lab): Hope  MD NOTIFIED: Vidal Schwalbe, NP  TIME OF NOTIFICATION: 3:50pm  RESPONSE: see result note

## 2019-05-13 NOTE — Progress Notes (Signed)
Verbal consent for services obtained from patient prior to services given.  Location of call:  provider at work patient at home  Names of all persons present for services: Haley Paris, NP Chief complaint:   Left great toe x yesterday morning, unchanged. Woke up with pain, ache. Able to walk but it is painful. Redness the size 'of quarter' on left great toe.  No h/o mrsa.   Hasnt been working in yard. No known bites. No injury.   No swelling, pain or redness in ankle, calf . No fever. Feels well otherwise.   Hasnt tried any medications for this pain.   H/o CKD  Similar episode in the past.Given antibiotic with resolve.  Thought something had bit her.   Seen by Dr Aundra Dubin 07/2018. Right toe pain  thought arthritis, gout, cellulitis; treated with bactroban, doxycycline, depo medrol 40 x 1.   Given bactrim from urgent care 2018 without improvement.   Follows with Dr [name] for BRCA and CLL follow up. Elevated WBCs in the past.   A/P/next steps: Problem List Items Addressed This Visit      Other   Great toe pain, left    Acute insidious onset.  Differentials include cellulitis versus gout versus arthritis.  In the setting of telemedicine, I was hesitant to not start an antibiotic and miss infection.  Pending sed rate, ESR, CBC.  I expect her WBC to be elevated in setting of CLL however if it is tremendously elevated above her baseline, I may raise suspicion for infection.  Pending uric acid although understand this may not be very helpful in acute gout.  Uric acid to baseline for her.  Patient has follow-up with PCP in 2 days, advised patient that likely steroids could be used also for possible is not a bacterial infection.  She will go to urgent care over the weekend if any new or no worsening symptoms       Other Visit Diagnoses    Toe pain, left    -  Primary   Relevant Medications   doxycycline (VIBRA-TABS) 100 MG tablet   Other Relevant Orders   CBC with  Differential/Platelet   C-reactive protein   Sedimentation rate   Uric acid   DG Foot Complete Left       I spent 15 min  discussing plan of care over the phone.

## 2019-05-13 NOTE — Addendum Note (Signed)
Addended by: Leeanne Rio on: 05/13/2019 02:18 PM   Modules accepted: Orders

## 2019-05-13 NOTE — Assessment & Plan Note (Signed)
Acute insidious onset.  Differentials include cellulitis versus gout versus arthritis.  In the setting of telemedicine, I was hesitant to not start an antibiotic and miss infection.  Pending sed rate, ESR, CBC.  I expect her WBC to be elevated in setting of CLL however if it is tremendously elevated above her baseline, I may raise suspicion for infection.  Pending uric acid although understand this may not be very helpful in acute gout.  Uric acid to baseline for her.  Patient has follow-up with PCP in 2 days, advised patient that likely steroids could be used also for possible is not a bacterial infection.  She will go to urgent care over the weekend if any new or no worsening symptoms 

## 2019-05-15 ENCOUNTER — Other Ambulatory Visit: Payer: Self-pay | Admitting: Oncology

## 2019-05-16 ENCOUNTER — Encounter: Payer: Self-pay | Admitting: Internal Medicine

## 2019-05-16 ENCOUNTER — Other Ambulatory Visit: Payer: Self-pay | Admitting: Oncology

## 2019-05-16 ENCOUNTER — Ambulatory Visit (INDEPENDENT_AMBULATORY_CARE_PROVIDER_SITE_OTHER): Payer: Medicare HMO | Admitting: Internal Medicine

## 2019-05-16 ENCOUNTER — Other Ambulatory Visit: Payer: Self-pay

## 2019-05-16 ENCOUNTER — Other Ambulatory Visit: Payer: Self-pay | Admitting: *Deleted

## 2019-05-16 DIAGNOSIS — C9110 Chronic lymphocytic leukemia of B-cell type not having achieved remission: Secondary | ICD-10-CM | POA: Diagnosis not present

## 2019-05-16 DIAGNOSIS — N183 Chronic kidney disease, stage 3 unspecified: Secondary | ICD-10-CM

## 2019-05-16 DIAGNOSIS — M79675 Pain in left toe(s): Secondary | ICD-10-CM

## 2019-05-16 DIAGNOSIS — C50512 Malignant neoplasm of lower-outer quadrant of left female breast: Secondary | ICD-10-CM

## 2019-05-16 MED ORDER — COLCHICINE 0.6 MG PO TABS: 0.6000 mg | ORAL_TABLET | Freq: Two times a day (BID) | ORAL | 0 refills | Status: DC

## 2019-05-16 MED ORDER — ALLOPURINOL 300 MG PO TABS: 300.0000 mg | ORAL_TABLET | Freq: Every day | ORAL | 4 refills | Status: DC

## 2019-05-16 NOTE — Progress Notes (Signed)
Telephone Note  This visit type was conducted due to national recommendations for restrictions regarding the COVID-19 pandemic (e.g. social distancing).  This format is felt to be most appropriate for this patient at this time.  All issues noted in this document were discussed and addressed.  No physical exam was performed (except for noted visual exam findings with Video Visits).   I connected with@ on 05/16/19 at 11:00 AM EDT by  telephone and verified that I am speaking with the correct person using two identifiers. Location patient: home Location provider: work or home office Persons participating in the virtual visit: patient, provider  I discussed the limitations, risks, security and privacy concerns of performing an evaluation and management service by telephone and the availability of in person appointments. I also discussed with the patient that there may be a patient responsible charge related to this service. The patient expressed understanding and agreed to proceed.  Reason for visit: follow up on great toe pain .  HPI:  85 yr old female with history of CLL, MGUS surveillance only,  Evaluated last week for great toe pain  On unclear etiology (gout vs cellulitis).  MA ordered labs including a  Treated as gout plus infection steroids and doxy started by margaret last week . Plain films show degenerative  changes of first MTP joint uric acid level 7.1  The toe Felt better over the weekend without medication,  The redness swelling and pain both resolved but started hurting again last night . Toe is pink now not red  Anymore, but still  a little warm.  Spent the day off of it yesterday   ROS: See pertinent positives and negatives per HPI.  Past Medical History:  Diagnosis Date  . Breast cancer ([place])   . Cataract   . Chickenpox   . Contact lens/glasses fitting   . Diabetes mellitus without complication ([place])   . Hyperlipidemia   . Hypertension   . Inflammatory polyps of colon [facility])      Past Surgical History:  Procedure Laterality Date  . BREAST LUMPECTOMY Left 07/28/2012  . BREAST SURGERY  9/13   lt lump  . cataracts  2014  . COLONOSCOPY WITH PROPOFOL N/A 01/05/2017   Procedure: COLONOSCOPY WITH PROPOFOL;  Surgeon: Manya Silvas, MD;  Location: [facility] ENDOSCOPY;  Service: Endoscopy;  Laterality: N/A;  . JOINT REPLACEMENT Right July 2015   [place], right knee  . KNEE ARTHROSCOPY W/ MENISCAL REPAIR  09/23/11   Right  . MASTECTOMY, PARTIAL  08/06/2012   Procedure: MASTECTOMY PARTIAL;  Surgeon: Adin Hector, MD;  Location: [place];  Service: General;  Laterality: Left;  Left partial mastectomy with re-excision of margins    Family History  Problem Relation Age of Onset  . Lung cancer Mother   . Arthritis Mother   . Hyperlipidemia Mother   . Hypertension Mother   . Throat cancer Father   . Hyperlipidemia Father   . Cancer Paternal Aunt        breast  . Dementia Brother   . Leukemia Brother        lymphatic /CLL    SOCIAL HX:  reports that she has never smoked. She has never used smokeless tobacco. She reports current alcohol use. She reports that she does not use drugs.   Current Outpatient Medications:  .  allopurinol (ZYLOPRIM) 300 MG tablet, Take 1 tablet (300 mg total) by mouth daily., Disp: 90 tablet, Rfl: 4 .  aspirin 81 MG  tablet, Take 81 mg by mouth daily., Disp: , Rfl:  .  b complex vitamins tablet, Take 1 tablet by mouth daily., Disp: , Rfl:  .  B Complex-C (RA B-COMPLEX/VITAMIN C CR) TBCR, Take by mouth., Disp: , Rfl:  .  Borage, Borago officinalis, (BORAGE OIL PO), Take 1,000 mg by mouth daily., Disp: , Rfl:  .  Calcium Carbonate-Vit D-Min (RA CALCIUM 600/VIT D/MINERALS) 600-200 MG-UNIT TABS, Take tablets by mouth once a day., Disp: , Rfl:  .  Cholecalciferol (VITAMIN D-3) 1000 UNITS CAPS, Take 1 capsule by mouth at bedtime., Disp: , Rfl:  .  CINNAMON PO, Take 2,000 mg by mouth at bedtime., Disp: , Rfl:  .  Coenzyme Q10  (CO Q-10) 200 MG CAPS, Take 1 capsule by mouth at bedtime., Disp: , Rfl:  .  Collagenase POWD, by Does not apply route daily at 6 (six) AM., Disp: , Rfl:  .  doxycycline (VIBRA-TABS) 100 MG tablet, Take 1 tablet (100 mg total) by mouth 2 (two) times daily., Disp: 10 tablet, Rfl: 0 .  Flaxseed, Linseed, (FLAX SEED OIL) 1000 MG CAPS, Take by mouth., Disp: , Rfl:  .  Indole-3-Carbinol POWD, Take 200 mg by mouth 2 (two) times daily., Disp: , Rfl:  .  Krill Oil 1500 MG CAPS, Take by mouth., Disp: , Rfl:  .  Magnesium 500 MG CAPS, Take 1 capsule by mouth daily. , Disp: , Rfl:  .  Milk Thistle 300 MG CAPS, Take by mouth daily at 6 (six) AM., Disp: , Rfl:  .  Misc Natural Products (NARCOSOFT HERBAL LAX PO), Take 2 tablets by mouth at bedtime. , Disp: , Rfl:  .  mupirocin ointment (BACTROBAN) 2 %, Apply 1 application topically 2 (two) times daily., Disp: 30 g, Rfl: 0 .  Nutritional Supplements (NUTRITIONAL SUPPLEMENT PO), Take 3 capsules by mouth 2 (two) times daily. Neprinol AFD>, Disp: , Rfl:  .  Red Yeast Rice Extract (RED YEAST RICE PO), Take 1,000 mg by mouth 2 (two) times daily., Disp: , Rfl:  .  telmisartan (MICARDIS) 20 MG tablet, Take 1 tablet (20 mg total) by mouth at bedtime., Disp: 90 tablet, Rfl: 1 .  vitamin E 400 UNIT capsule, Take 400 Units by mouth daily., Disp: , Rfl:  .  colchicine 0.6 MG tablet, Take 1 tablet (0.6 mg total) by mouth 2 (two) times daily., Disp: 30 tablet, Rfl: 0  EXAM:  VITALS per patient if applicable:  GENERAL: alert, oriented, appears well and in no acute distress  HEENT: atraumatic, conjunttiva clear, no obvious abnormalities on inspection of external nose and ears  NECK: normal movements of the head and neck  LUNGS: on inspection no signs of respiratory distress, breathing rate appears normal, no obvious gross SOB, gasping or wheezing  CV: no obvious cyanosis  MS: moves all visible extremities without noticeable abnormality  PSYCH/NEURO: pleasant and  cooperative, no obvious depression or anxiety, speech and thought processing grossly intact  ASSESSMENT AND PLAN:  Discussed the following assessment and plan:  Great toe pain, left etiology is unclear given lymphocytosis,  She has No evidence of erosive changes on plain films, but no reason to have developed cellulitis (no paronychia,  Scrape or otherwise).  Since the toe improved over the weekend without antibiotics or prednisone  But recurred again last night,   cellulitis is less likely.  Will prescribe colchicine, as prednisone will cause more demargination of leukocytes .   CLL (chronic lymphocytic leukemia) (HCC) Recent increase in the  setting of an inflammatory process involving the great toe.  Dr [name] has prescribed allopurinol and additional labs     I discussed the assessment and treatment plan with the patient. The patient was provided an opportunity to ask questions and all were answered. The patient agreed with the plan and demonstrated an understanding of the instructions.   The patient was advised to call back or seek an in-person evaluation if the symptoms worsen or if the condition fails to improve as anticipated.  I provided 25 minutes of non-face-to-face time during this encounter.   Crecencio Mc, MD

## 2019-05-16 NOTE — Assessment & Plan Note (Signed)
Recent increase in the setting of an inflammatory process involving the great toe.  Dr [name] has prescribed allopurinol and additional labs

## 2019-05-16 NOTE — Assessment & Plan Note (Signed)
etiology is unclear given lymphocytosis,  She has No evidence of erosive changes on plain films, but no reason to have developed cellulitis (no paronychia,  Scrape or otherwise).  Since the toe improved over the weekend without antibiotics or prednisone  But recurred again last night,   cellulitis is less likely.  Will prescribe colchicine, as prednisone will cause more demargination of leukocytes .

## 2019-05-17 ENCOUNTER — Other Ambulatory Visit: Payer: Self-pay | Admitting: Internal Medicine

## 2019-05-19 ENCOUNTER — Ambulatory Visit (INDEPENDENT_AMBULATORY_CARE_PROVIDER_SITE_OTHER): Payer: Medicare HMO | Admitting: Internal Medicine

## 2019-05-19 ENCOUNTER — Encounter: Payer: Self-pay | Admitting: Internal Medicine

## 2019-05-19 ENCOUNTER — Other Ambulatory Visit: Payer: Self-pay

## 2019-05-19 DIAGNOSIS — M79675 Pain in left toe(s): Secondary | ICD-10-CM

## 2019-05-19 NOTE — Assessment & Plan Note (Signed)
History is consistent with gout and aggravated by initiaiton of allopurinol without colchicine start.  Advised to take aleve and tylenol today and come by the office tomorrow for an RN visit after her lab draw to receive an IM steroid injection

## 2019-05-19 NOTE — Progress Notes (Signed)
Telephone  Note  This visit type was conducted due to national recommendations for restrictions regarding the COVID-19 pandemic (e.g. social distancing).  This format is felt to be most appropriate for this patient at this time.  All issues noted in this document were discussed and addressed.  No physical exam was performed (except for noted visual exam findings with Video Visits).   I connected with@ on 05/19/19 at  9:00 AM EDT by telephone and verified that I am speaking with the correct person using two identifiers. Location patient: home Location provider: work or home office Persons participating in the virtual visit: patient, provider  I discussed the limitations, risks, security and privacy concerns of performing an evaluation and management service by telephone and the availability of in person appointments. I also discussed with the patient that there may be a patient responsible charge related to this service. The patient expressed understanding and agreed to proceed.   Reason for visit: worsening pain and inflammation of toe (presumed  gout )  HPI: 85 yr old female with history of CLL and MGUS , no treatment needed to date, diet controlled DM with CKD, hypertension  Presents for follow up on rleft great toe pain  initially seen by MA on Jun 19.  DDX included cellultis and gout  .  MA prexcribed meds for both (doxycycline and colchicine.)  She did not take the colchcine due to cost .  She declined prednisone but started taking  allopurinol prescribed by Dr [name] for suspected progression of CLL as suggested by her increase in leukoyctosis.    She stats that the pain , swelling and redness has increased despite taking the antibiotic and she had a sleepless night due to pain .  She is unwilling to take an oral prednisone taper due to history of unpleasant  side effects but is willing to have a steroid shot. .  She is scheduled for repeat labs including flow cytometry tomorrow   ROS:  See pertinent positives and negatives per HPI.  Past Medical History:  Diagnosis Date  . Breast cancer ([place])   . Cataract   . Chickenpox   . Contact lens/glasses fitting   . Diabetes mellitus without complication ([place])   . Hyperlipidemia   . Hypertension   . Inflammatory polyps of colon [facility])     Past Surgical History:  Procedure Laterality Date  . BREAST LUMPECTOMY Left 07/28/2012  . BREAST SURGERY  9/13   lt lump  . cataracts  2014  . COLONOSCOPY WITH PROPOFOL N/A 01/05/2017   Procedure: COLONOSCOPY WITH PROPOFOL;  Surgeon: Manya Silvas, MD;  Location: [facility] ENDOSCOPY;  Service: Endoscopy;  Laterality: N/A;  . JOINT REPLACEMENT Right July 2015   [place], right knee  . KNEE ARTHROSCOPY W/ MENISCAL REPAIR  09/23/11   Right  . MASTECTOMY, PARTIAL  08/06/2012   Procedure: MASTECTOMY PARTIAL;  Surgeon: Adin Hector, MD;  Location: [place];  Service: General;  Laterality: Left;  Left partial mastectomy with re-excision of margins    Family History  Problem Relation Age of Onset  . Lung cancer Mother   . Arthritis Mother   . Hyperlipidemia Mother   . Hypertension Mother   . Throat cancer Father   . Hyperlipidemia Father   . Cancer Paternal Aunt        breast  . Dementia Brother   . Leukemia Brother        lymphatic /CLL    SOCIAL HX:  reports that  she has never smoked. She has never used smokeless tobacco. She reports current alcohol use. She reports that she does not use drugs.  Current Outpatient Medications:  .  allopurinol (ZYLOPRIM) 300 MG tablet, Take 1 tablet (300 mg total) by mouth daily., Disp: 90 tablet, Rfl: 4 .  aspirin 81 MG tablet, Take 81 mg by mouth daily., Disp: , Rfl:  .  b complex vitamins tablet, Take 1 tablet by mouth daily., Disp: , Rfl:  .  B Complex-C (RA B-COMPLEX/VITAMIN C CR) TBCR, Take by mouth., Disp: , Rfl:  .  Borage, Borago officinalis, (BORAGE OIL PO), Take 1,000 mg by mouth daily., Disp: , Rfl:  .  Calcium  Carbonate-Vit D-Min (RA CALCIUM 600/VIT D/MINERALS) 600-200 MG-UNIT TABS, Take tablets by mouth once a day., Disp: , Rfl:  .  Cholecalciferol (VITAMIN D-3) 1000 UNITS CAPS, Take 1 capsule by mouth at bedtime., Disp: , Rfl:  .  CINNAMON PO, Take 2,000 mg by mouth at bedtime., Disp: , Rfl:  .  Coenzyme Q10 (CO Q-10) 200 MG CAPS, Take 1 capsule by mouth at bedtime., Disp: , Rfl:  .  COLCRYS 0.6 MG tablet, TAKE 1 TABLET (0.6 MG TOTAL) BY MOUTH 2 (TWO) TIMES DAILY., Disp: 180 tablet, Rfl: 1 .  Collagenase POWD, by Does not apply route daily at 6 (six) AM., Disp: , Rfl:  .  Flaxseed, Linseed, (FLAX SEED OIL) 1000 MG CAPS, Take by mouth., Disp: , Rfl:  .  Indole-3-Carbinol POWD, Take 200 mg by mouth 2 (two) times daily., Disp: , Rfl:  .  Krill Oil 1500 MG CAPS, Take by mouth., Disp: , Rfl:  .  Magnesium 500 MG CAPS, Take 1 capsule by mouth daily. , Disp: , Rfl:  .  Milk Thistle 300 MG CAPS, Take by mouth daily at 6 (six) AM., Disp: , Rfl:  .  Misc Natural Products (NARCOSOFT HERBAL LAX PO), Take 2 tablets by mouth at bedtime. , Disp: , Rfl:  .  mupirocin ointment (BACTROBAN) 2 %, Apply 1 application topically 2 (two) times daily., Disp: 30 g, Rfl: 0 .  Nutritional Supplements (NUTRITIONAL SUPPLEMENT PO), Take 3 capsules by mouth 2 (two) times daily. Neprinol AFD>, Disp: , Rfl:  .  Red Yeast Rice Extract (RED YEAST RICE PO), Take 1,000 mg by mouth 2 (two) times daily., Disp: , Rfl:  .  telmisartan (MICARDIS) 20 MG tablet, Take 1 tablet (20 mg total) by mouth at bedtime., Disp: 90 tablet, Rfl: 1 .  vitamin E 400 UNIT capsule, Take 400 Units by mouth daily., Disp: , Rfl:  .  doxycycline (VIBRA-TABS) 100 MG tablet, Take 1 tablet (100 mg total) by mouth 2 (two) times daily. (Patient not taking: Reported on 05/19/2019), Disp: 10 tablet, Rfl: 0  EXAM: . General appearance: alert, cooperative and articulate.  No signs of being in distress  Lungs: not short of breath ,  No cough, speaking in full sentences   Psych: affect normal,dspeech is articulate and non pressured .  Denies suicidal thoughts   ASSESSMENT AND PLAN:  Discussed the following assessment and plan:  Great toe pain, left History is consistent with gout and aggravated by initiaiton of allopurinol without colchicine start.  Advised to take aleve and tylenol today and come by the office tomorrow for an RN visit after her lab draw to receive an IM steroid injection     I discussed the assessment and treatment plan with the patient. The patient was provided an opportunity to ask questions  and all were answered. The patient agreed with the plan and demonstrated an understanding of the instructions.   The patient was advised to call back or seek an in-person evaluation if the symptoms worsen or if the condition fails to improve as anticipated.  I provided 16 minutes of non-face-to-face time during this encounter.   Crecencio Mc, MD

## 2019-05-20 ENCOUNTER — Ambulatory Visit (INDEPENDENT_AMBULATORY_CARE_PROVIDER_SITE_OTHER): Payer: Medicare HMO | Admitting: *Deleted

## 2019-05-20 ENCOUNTER — Inpatient Hospital Stay: Payer: Medicare HMO | Attending: Oncology

## 2019-05-20 ENCOUNTER — Other Ambulatory Visit: Payer: Self-pay

## 2019-05-20 DIAGNOSIS — Z806 Family history of leukemia: Secondary | ICD-10-CM | POA: Diagnosis not present

## 2019-05-20 DIAGNOSIS — D472 Monoclonal gammopathy: Secondary | ICD-10-CM | POA: Diagnosis not present

## 2019-05-20 DIAGNOSIS — Z801 Family history of malignant neoplasm of trachea, bronchus and lung: Secondary | ICD-10-CM | POA: Diagnosis not present

## 2019-05-20 DIAGNOSIS — C9110 Chronic lymphocytic leukemia of B-cell type not having achieved remission: Secondary | ICD-10-CM | POA: Insufficient documentation

## 2019-05-20 DIAGNOSIS — Z808 Family history of malignant neoplasm of other organs or systems: Secondary | ICD-10-CM | POA: Insufficient documentation

## 2019-05-20 DIAGNOSIS — D72820 Lymphocytosis (symptomatic): Secondary | ICD-10-CM | POA: Diagnosis not present

## 2019-05-20 DIAGNOSIS — M79675 Pain in left toe(s): Secondary | ICD-10-CM

## 2019-05-20 DIAGNOSIS — Z86010 Personal history of colonic polyps: Secondary | ICD-10-CM | POA: Diagnosis not present

## 2019-05-20 DIAGNOSIS — C50512 Malignant neoplasm of lower-outer quadrant of left female breast: Secondary | ICD-10-CM | POA: Diagnosis not present

## 2019-05-20 DIAGNOSIS — Z820 Family history of epilepsy and other diseases of the nervous system: Secondary | ICD-10-CM | POA: Insufficient documentation

## 2019-05-20 DIAGNOSIS — Z803 Family history of malignant neoplasm of breast: Secondary | ICD-10-CM | POA: Diagnosis not present

## 2019-05-20 DIAGNOSIS — Z83430 Family history of elevated lipoprotein(a): Secondary | ICD-10-CM | POA: Insufficient documentation

## 2019-05-20 DIAGNOSIS — Z8249 Family history of ischemic heart disease and other diseases of the circulatory system: Secondary | ICD-10-CM | POA: Insufficient documentation

## 2019-05-20 DIAGNOSIS — Z8261 Family history of arthritis: Secondary | ICD-10-CM | POA: Diagnosis not present

## 2019-05-20 LAB — CMP (CANCER CENTER ONLY)
ALT: 27 U/L (ref 0–44)
AST: 27 U/L (ref 15–41)
Albumin: 3.9 g/dL (ref 3.5–5.0)
Alkaline Phosphatase: 68 U/L (ref 38–126)
Anion gap: 9 (ref 5–15)
BUN: 23 mg/dL (ref 8–23)
CO2: 24 mmol/L (ref 22–32)
Calcium: 9.6 mg/dL (ref 8.9–10.3)
Chloride: 108 mmol/L (ref 98–111)
Creatinine: 1.03 mg/dL — ABNORMAL HIGH (ref 0.44–1.00)
GFR, Est AFR Am: 59 mL/min — ABNORMAL LOW (ref >60)
GFR, Estimated: 51 mL/min — ABNORMAL LOW (ref >60)
Glucose, Bld: 97 mg/dL (ref 70–99)
Potassium: 4.1 mmol/L (ref 3.5–5.1)
Sodium: 141 mmol/L (ref 135–145)
Total Bilirubin: 0.4 mg/dL (ref 0.3–1.2)
Total Protein: 7.2 g/dL (ref 6.5–8.1)

## 2019-05-20 LAB — CBC WITH DIFFERENTIAL (CANCER CENTER ONLY)
Abs Immature Granulocytes: 0 10*3/uL (ref 0.00–0.07)
Basophils Absolute: 0 10*3/uL (ref 0.0–0.1)
Basophils Relative: 0 %
Eosinophils Absolute: 0.3 10*3/uL (ref 0.0–0.5)
Eosinophils Relative: 1 %
HCT: 42.3 % (ref 36.0–46.0)
Hemoglobin: 13.7 g/dL (ref 12.0–15.0)
Lymphocytes Relative: 85 %
Lymphs Abs: 25.8 10*3/uL — ABNORMAL HIGH (ref 0.7–4.0)
MCH: 29.5 pg (ref 26.0–34.0)
MCHC: 32.4 g/dL (ref 30.0–36.0)
MCV: 91.2 fL (ref 80.0–100.0)
Monocytes Absolute: 0.6 10*3/uL (ref 0.1–1.0)
Monocytes Relative: 2 %
Neutro Abs: 3.6 10*3/uL (ref 1.7–17.7)
Neutrophils Relative %: 12 %
Platelet Count: 221 10*3/uL (ref 150–400)
RBC: 4.64 MIL/uL (ref 3.87–5.11)
RDW: 12.9 % (ref 11.5–15.5)
WBC Count: 30.4 10*3/uL — ABNORMAL HIGH (ref 4.0–10.5)
nRBC: 0 % (ref 0.0–0.2)

## 2019-05-20 LAB — LACTATE DEHYDROGENASE: LDH: 271 U/L — ABNORMAL HIGH (ref 98–192)

## 2019-05-20 MED ORDER — METHYLPREDNISOLONE ACETATE 40 MG/ML IJ SUSP
80.0000 mg | Freq: Once | INTRAMUSCULAR | Status: AC
  Administered 2019-05-20: 16:00:00 80 mg via INTRAMUSCULAR

## 2019-05-20 NOTE — Progress Notes (Signed)
Depo- medrol 80 mg given in left ventri gluteal, patient voiced no concerns or shown any distress.

## 2019-05-23 LAB — KAPPA/LAMBDA LIGHT CHAINS
Kappa free light chain: 16.9 mg/L (ref 3.3–19.4)
Kappa, lambda light chain ratio: 1.34 (ref 0.26–1.65)
Lambda free light chains: 12.6 mg/L (ref 5.7–26.3)

## 2019-05-23 LAB — BETA 2 MICROGLOBULIN, SERUM: Beta-2 Microglobulin: 2.5 mg/L — ABNORMAL HIGH (ref 0.6–2.4)

## 2019-05-24 ENCOUNTER — Other Ambulatory Visit: Payer: Self-pay | Admitting: Oncology

## 2019-05-24 ENCOUNTER — Other Ambulatory Visit: Payer: Self-pay | Admitting: *Deleted

## 2019-05-24 DIAGNOSIS — C50512 Malignant neoplasm of lower-outer quadrant of left female breast: Secondary | ICD-10-CM

## 2019-05-24 DIAGNOSIS — D472 Monoclonal gammopathy: Secondary | ICD-10-CM

## 2019-05-24 DIAGNOSIS — C9110 Chronic lymphocytic leukemia of B-cell type not having achieved remission: Secondary | ICD-10-CM

## 2019-05-24 DIAGNOSIS — C91Z0 Other lymphoid leukemia not having achieved remission: Secondary | ICD-10-CM

## 2019-05-24 LAB — MULTIPLE MYELOMA PANEL, SERUM
Albumin SerPl Elph-Mcnc: 3.8 g/dL (ref 2.9–4.4)
Albumin/Glob SerPl: 1.4 (ref 0.7–1.7)
Alpha 1: 0.1 g/dL (ref 0.0–0.4)
Alpha2 Glob SerPl Elph-Mcnc: 0.9 g/dL (ref 0.4–1.0)
B-Globulin SerPl Elph-Mcnc: 1 g/dL (ref 0.7–1.3)
Gamma Glob SerPl Elph-Mcnc: 0.8 g/dL (ref 0.4–1.8)
Globulin, Total: 2.9 g/dL (ref 2.2–3.9)
IgA: 271 mg/dL (ref 64–422)
IgG (Immunoglobin G), Serum: 913 mg/dL (ref 586–1602)
IgM (Immunoglobulin M), Srm: 78 mg/dL (ref 26–217)
M Protein SerPl Elph-Mcnc: 0.3 g/dL — ABNORMAL HIGH
Total Protein ELP: 6.7 g/dL (ref 6.0–8.5)

## 2019-05-25 ENCOUNTER — Inpatient Hospital Stay: Payer: Medicare HMO | Attending: Oncology

## 2019-05-25 ENCOUNTER — Other Ambulatory Visit: Payer: Self-pay

## 2019-05-25 DIAGNOSIS — Z808 Family history of malignant neoplasm of other organs or systems: Secondary | ICD-10-CM | POA: Insufficient documentation

## 2019-05-25 DIAGNOSIS — C50512 Malignant neoplasm of lower-outer quadrant of left female breast: Secondary | ICD-10-CM | POA: Diagnosis not present

## 2019-05-25 DIAGNOSIS — Z83438 Family history of other disorder of lipoprotein metabolism and other lipidemia: Secondary | ICD-10-CM | POA: Diagnosis not present

## 2019-05-25 DIAGNOSIS — M79676 Pain in unspecified toe(s): Secondary | ICD-10-CM | POA: Insufficient documentation

## 2019-05-25 DIAGNOSIS — E785 Hyperlipidemia, unspecified: Secondary | ICD-10-CM | POA: Diagnosis not present

## 2019-05-25 DIAGNOSIS — Z8249 Family history of ischemic heart disease and other diseases of the circulatory system: Secondary | ICD-10-CM | POA: Insufficient documentation

## 2019-05-25 DIAGNOSIS — C9110 Chronic lymphocytic leukemia of B-cell type not having achieved remission: Secondary | ICD-10-CM

## 2019-05-25 DIAGNOSIS — Z801 Family history of malignant neoplasm of trachea, bronchus and lung: Secondary | ICD-10-CM | POA: Insufficient documentation

## 2019-05-25 DIAGNOSIS — Z79899 Other long term (current) drug therapy: Secondary | ICD-10-CM | POA: Insufficient documentation

## 2019-05-25 DIAGNOSIS — E119 Type 2 diabetes mellitus without complications: Secondary | ICD-10-CM | POA: Insufficient documentation

## 2019-05-25 DIAGNOSIS — Z803 Family history of malignant neoplasm of breast: Secondary | ICD-10-CM | POA: Insufficient documentation

## 2019-05-25 DIAGNOSIS — Z8261 Family history of arthritis: Secondary | ICD-10-CM | POA: Insufficient documentation

## 2019-05-25 DIAGNOSIS — Z853 Personal history of malignant neoplasm of breast: Secondary | ICD-10-CM | POA: Insufficient documentation

## 2019-05-25 DIAGNOSIS — D472 Monoclonal gammopathy: Secondary | ICD-10-CM | POA: Insufficient documentation

## 2019-05-25 DIAGNOSIS — Z806 Family history of leukemia: Secondary | ICD-10-CM | POA: Diagnosis not present

## 2019-05-25 DIAGNOSIS — I10 Essential (primary) hypertension: Secondary | ICD-10-CM | POA: Insufficient documentation

## 2019-05-25 DIAGNOSIS — M109 Gout, unspecified: Secondary | ICD-10-CM | POA: Insufficient documentation

## 2019-05-25 DIAGNOSIS — Z820 Family history of epilepsy and other diseases of the nervous system: Secondary | ICD-10-CM | POA: Insufficient documentation

## 2019-05-25 DIAGNOSIS — M5136 Other intervertebral disc degeneration, lumbar region: Secondary | ICD-10-CM | POA: Diagnosis not present

## 2019-05-25 DIAGNOSIS — C91Z0 Other lymphoid leukemia not having achieved remission: Secondary | ICD-10-CM

## 2019-05-25 LAB — CBC WITH DIFFERENTIAL/PLATELET
Abs Immature Granulocytes: 0.19 10*3/uL — ABNORMAL HIGH (ref 0.00–0.07)
Basophils Absolute: 0.1 10*3/uL (ref 0.0–0.1)
Basophils Relative: 0 %
Eosinophils Absolute: 0.1 10*3/uL (ref 0.0–0.5)
Eosinophils Relative: 0 %
HCT: 44.9 % (ref 36.0–46.0)
Hemoglobin: 14.8 g/dL (ref 12.0–15.0)
Immature Granulocytes: 1 %
Lymphocytes Relative: 71 %
Lymphs Abs: 23.8 10*3/uL — ABNORMAL HIGH (ref 0.7–4.0)
MCH: 29.5 pg (ref 26.0–34.0)
MCHC: 33 g/dL (ref 30.0–36.0)
MCV: 89.6 fL (ref 80.0–100.0)
Monocytes Absolute: 3 10*3/uL — ABNORMAL HIGH (ref 0.1–1.0)
Monocytes Relative: 9 %
Neutro Abs: 6.4 10*3/uL (ref 1.7–7.7)
Neutrophils Relative %: 19 %
Platelets: 249 10*3/uL (ref 150–400)
RBC: 5.01 MIL/uL (ref 3.87–5.11)
RDW: 12.9 % (ref 11.5–15.5)
WBC: 33.5 10*3/uL — ABNORMAL HIGH (ref 4.0–10.5)
nRBC: 0 % (ref 0.0–0.2)

## 2019-05-25 LAB — COMPREHENSIVE METABOLIC PANEL
ALT: 31 U/L (ref 0–44)
AST: 29 U/L (ref 15–41)
Albumin: 4.1 g/dL (ref 3.5–5.0)
Alkaline Phosphatase: 75 U/L (ref 38–126)
Anion gap: 10 (ref 5–15)
BUN: 21 mg/dL (ref 8–23)
CO2: 25 mmol/L (ref 22–32)
Calcium: 9.7 mg/dL (ref 8.9–10.3)
Chloride: 105 mmol/L (ref 98–111)
Creatinine, Ser: 1.11 mg/dL — ABNORMAL HIGH (ref 0.44–1.00)
GFR calc Af Amer: 54 mL/min — ABNORMAL LOW (ref >60)
GFR calc non Af Amer: 46 mL/min — ABNORMAL LOW (ref >60)
Glucose, Bld: 117 mg/dL — ABNORMAL HIGH (ref 70–99)
Potassium: 4.7 mmol/L (ref 3.5–5.1)
Sodium: 140 mmol/L (ref 135–145)
Total Bilirubin: 0.4 mg/dL (ref 0.3–1.2)
Total Protein: 7.6 g/dL (ref 6.5–8.1)

## 2019-05-25 LAB — SAVE SMEAR(SSMR), FOR PROVIDER SLIDE REVIEW

## 2019-05-25 LAB — LACTATE DEHYDROGENASE: LDH: 258 U/L — ABNORMAL HIGH (ref 98–192)

## 2019-05-26 ENCOUNTER — Inpatient Hospital Stay (HOSPITAL_BASED_OUTPATIENT_CLINIC_OR_DEPARTMENT_OTHER): Payer: Medicare HMO | Admitting: Oncology

## 2019-05-26 ENCOUNTER — Other Ambulatory Visit: Payer: Self-pay

## 2019-05-26 ENCOUNTER — Other Ambulatory Visit: Payer: Medicare HMO

## 2019-05-26 VITALS — BP 184/68 | HR 85 | Temp 98.6°F | Resp 18 | Wt 172.2 lb

## 2019-05-26 DIAGNOSIS — Z801 Family history of malignant neoplasm of trachea, bronchus and lung: Secondary | ICD-10-CM

## 2019-05-26 DIAGNOSIS — Z806 Family history of leukemia: Secondary | ICD-10-CM

## 2019-05-26 DIAGNOSIS — E785 Hyperlipidemia, unspecified: Secondary | ICD-10-CM

## 2019-05-26 DIAGNOSIS — M109 Gout, unspecified: Secondary | ICD-10-CM

## 2019-05-26 DIAGNOSIS — Z820 Family history of epilepsy and other diseases of the nervous system: Secondary | ICD-10-CM

## 2019-05-26 DIAGNOSIS — D472 Monoclonal gammopathy: Secondary | ICD-10-CM | POA: Diagnosis not present

## 2019-05-26 DIAGNOSIS — M5136 Other intervertebral disc degeneration, lumbar region: Secondary | ICD-10-CM | POA: Diagnosis not present

## 2019-05-26 DIAGNOSIS — Z853 Personal history of malignant neoplasm of breast: Secondary | ICD-10-CM

## 2019-05-26 DIAGNOSIS — I10 Essential (primary) hypertension: Secondary | ICD-10-CM

## 2019-05-26 DIAGNOSIS — C9110 Chronic lymphocytic leukemia of B-cell type not having achieved remission: Secondary | ICD-10-CM | POA: Diagnosis not present

## 2019-05-26 DIAGNOSIS — M79676 Pain in unspecified toe(s): Secondary | ICD-10-CM | POA: Diagnosis not present

## 2019-05-26 DIAGNOSIS — Z8249 Family history of ischemic heart disease and other diseases of the circulatory system: Secondary | ICD-10-CM

## 2019-05-26 DIAGNOSIS — Z170 Estrogen receptor positive status [ER+]: Secondary | ICD-10-CM

## 2019-05-26 DIAGNOSIS — E119 Type 2 diabetes mellitus without complications: Secondary | ICD-10-CM

## 2019-05-26 DIAGNOSIS — C50512 Malignant neoplasm of lower-outer quadrant of left female breast: Secondary | ICD-10-CM | POA: Diagnosis not present

## 2019-05-26 DIAGNOSIS — Z808 Family history of malignant neoplasm of other organs or systems: Secondary | ICD-10-CM

## 2019-05-26 DIAGNOSIS — Z8261 Family history of arthritis: Secondary | ICD-10-CM

## 2019-05-26 DIAGNOSIS — C91Z0 Other lymphoid leukemia not having achieved remission: Secondary | ICD-10-CM

## 2019-05-26 DIAGNOSIS — Z803 Family history of malignant neoplasm of breast: Secondary | ICD-10-CM

## 2019-05-26 DIAGNOSIS — Z79899 Other long term (current) drug therapy: Secondary | ICD-10-CM

## 2019-05-26 DIAGNOSIS — Z83438 Family history of other disorder of lipoprotein metabolism and other lipidemia: Secondary | ICD-10-CM

## 2019-05-26 NOTE — Progress Notes (Signed)
[facility]  [phone] [phone]     ID: Haley Love   DOB: [birth year]  MR#: [id number]  HCW#:[id number]   Patient Care Team: Crecencio Mc, MD as PCP - General (Internal Medicine) , Virgie Dad, MD as Consulting Physician (Oncology)   CHIEF COMPLAINT: left breast cancer; M-GUS, T-cell CLL  CURRENT THERAPY: observation   INTERVAL HISTORY: Haley Love returns today for follow-up and treatment of her history of breast cancer and her monoclonal gammopathy and now T-cellCLL.  She continues under observation as far as her breast cancer is concerned, and has mammography scheduled for September 2020.   Since her last visit here, she underwent a lumbar spine xray on 10/29/2019 for low back pain showing Diffuse degenerative disc disease. No acute compression deformity. Mild curvature of the lumbar spine convex to the left by 15 degrees.  She also underwent a sacrum and coccyx xray on the same day showing: Mild degenerative joint disease of the hips for age. Degenerative change of the SI joints.  No acute abnormality.   Finally, she underwent a left foot xray on 05/13/2019 for great toe pain showing: No acute osseous abnormality. Mild degenerative change of the first MTP joint.    REVIEW OF SYSTEMS: Haley Love has been following appropriate precautions in regard to the pandemic. She has no "B" symptoms, and specifically denies unexplained fatigue, unexplained weight loss, drenching sweats, fever, rash, bleeding, adenopathy, or any cytopenias that she is aware of. The patient denies unusual headaches, visual changes, nausea, vomiting, or dizziness. There has been no unusual cough, phlegm production, or pleurisy. This been no change in bowel or bladder habits. The patient denies unexplained fatigue or unexplained weight loss, bleeding, rash, or fever. A detailed review of systems was otherwise noncontributory.    HISTORY OF T-CELL CHRONIC LYMPHOID  LEUKEMIA: Haley Love has a history of lymphocytosis dating back at least to August 10, 2014 when her absolute lymphocyte count was 6.6.  This remained stable through 2018, but on 02/01/2018 blood counts doubled to 13.6.  The counts remained stable for some months, but a year and a half later on 05/13/2019 they had doubled again to 31.6.    Her counts since that time have actually been decreasing, likely secondary to a dose of Solu-Medrol she received for her gout, as follows Results for Haley Love, Haley Love (MRN [id number]) as of 05/26/2019 14:57  Ref. Range 05/13/2019 14:16 05/20/2019 12:23 05/25/2019 11:22  Lymphocyte # Latest Ref Range: 0.7 - 4.0 K/uL 31.6 (H) 25.8 (H) 23.8 (H)   Recall that she is known to have an IgG lambda monoclonal gammopathy of uncertain significance, dating back 5 years, with no evidence of progression.   Results for Haley Love, Haley Love (MRN [id number]) as of 05/26/2019 14:57  Ref. Range 08/10/2014 11:30 09/01/2014 10:48 07/29/2016 10:56 08/06/2017 11:59  M-SPIKE, % Latest Ref Range: Not Observed g/dL 0.45 0.45 0.4 (H) 0.4 (H)   Kappa lambda ratios have been unremarkable Results for Haley Love, Haley Love (MRN [id number]) as of 05/26/2019 14:57  Ref. Range 09/01/2014 10:48 02/19/2015 10:43 07/26/2015 10:15 07/29/2016 10:56 08/05/2017 11:17 08/06/2017 11:59 08/06/2018 10:14 05/20/2019 12:23  Kappa:Lambda Ratio Latest Ref Range: 0.26 - 1.65  0.70 1.37 1.29  1.22  1.43    Quantitative immunoglobulins were normal when most recently checked, 05/20/2019.    Finally she underwent flow cytometry 05/20/2019, showing ([id number]) a total lymphocyte population of 21.47 with overwhelming predominance of T cells.  There were 93% of all lymphocytes and they were  all CD4 positive.  There was no significant CD8 CD34 or CD56 expression and importantly there was no missing CD2, CD3, CD5, or CD7.  T-cell receptor rearrangement was requested 05/25/2019    BREAST CANCER HISTORY: From the original intake note:  Ziah had screening  mammography at the breast Center 06/17/2012 which showed a possible mass in the left breast. Left diagnostic mammography performed 07/02/2012 showed a persistent 1 cm spiculated mass in the lateral left breast, which was palpable and firm. Ultrasound showed an irregular hypoechoic mass measuring 8 mm. The left axilla was unremarkable.  Biopsy of the mass the same day ([id number]) showed an invasive ductal carcinoma, grade 1, estrogen receptor 100% progesterone receptor 39% positive, with no HER-2 amplification and an MIB-1 of 7%. Bilateral breast MRI were obtained 07/08/2012 this showed an irregular enhancing mass in the left breast, middle third, measuring 9 mm. There were no other suspicious masses in either breast and no axillary or internal mammary adenopathy.  The patient's subsequent history is as detailed below.  PAST MEDICAL HISTORY: Past Medical History:  Diagnosis Date  . Breast cancer ([place])   . Cataract   . Chickenpox   . Contact lens/glasses fitting   . Diabetes mellitus without complication ([place])   . Hyperlipidemia   . Hypertension   . Inflammatory polyps of colon ([place])     PAST SURGICAL HISTORY: Past Surgical History:  Procedure Laterality Date  . BREAST LUMPECTOMY Left 07/28/2012  . BREAST SURGERY  9/13   lt lump  . cataracts  2014  . COLONOSCOPY WITH PROPOFOL N/A 01/05/2017   Procedure: COLONOSCOPY WITH PROPOFOL;  Surgeon: Manya Silvas, MD;  Location: [facility] ENDOSCOPY;  Service: Endoscopy;  Laterality: N/A;  . JOINT REPLACEMENT Right July 2015   [place], right knee  . KNEE ARTHROSCOPY W/ MENISCAL REPAIR  09/23/11   Right  . MASTECTOMY, PARTIAL  08/06/2012   Procedure: MASTECTOMY PARTIAL;  Surgeon: Adin Hector, MD;  Location: [place];  Service: General;  Laterality: Left;  Left partial mastectomy with re-excision of margins  Right knee replacement in late July 2015.  FAMILY HISTORY Family History  Problem Relation Age of Onset  . Lung  cancer Mother   . Arthritis Mother   . Hyperlipidemia Mother   . Hypertension Mother   . Throat cancer Father   . Hyperlipidemia Father   . Cancer Paternal Aunt        breast  . Dementia Brother   . Leukemia Brother        lymphatic /CLL   the patient's father died at the age of 46 from cancer of the throat which had been diagnosed 6 years earlier. The patient's mother died at the age of 58. She had cancer of the lung lining diagnosed at age 107. The patient had 4 brothers, no sisters. There is no other cancer history in the immediate family.  GYNECOLOGIC HISTORY: Menarche age 67. Change of life age 48. She took hormone replacement approximately 10 years. She is GX P2, with first live birth at age 85.  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 She is divorced. Her son Sallyanne Birkhead (goes by "J.") also lives in [place] and is self-employed running a Education administrator business. Daughter Estelita Iten lives in [place]. She works as a Electrical engineer. The patient has 3 grandchildren. She attends a [employer].  ADVANCED DIRECTIVES: In place  HEALTH MAINTENANCE: Social History  Tobacco Use  . Smoking status: Never Smoker  . Smokeless tobacco: Never Used  Substance Use Topics  . Alcohol use: Yes    Comment: 1 glass wine week  . Drug use: No     Colonoscopy: 2012  PAP: 2011  Bone density: 2011  Lipid panel:  Allergies  Allergen Reactions  . No Known Allergies    OBJECTIVE: Older white woman who appears younger than stated age  ECOG: 1  Vitals:   05/26/19 1604  BP: (!) 184/68  Pulse: 85  Resp: 18  Temp: 98.6 F (37 C)  SpO2: 99%  Body mass index is 26.97 kg/m.  Sclerae unicteric, EOMs intact Wearing a mask No cervical or supraclavicular adenopathy, no axillary or inguinal adenopathy Lungs no rales or rhonchi Heart regular rate and rhythm Abd soft, nontender, positive bowel sounds MSK no focal spinal tenderness,  no upper extremity lymphedema Neuro: nonfocal, well oriented, appropriate affect Breasts: Right breast is benign.  The left breast is status post lumpectomy.  There there is no evidence of local recurrence.  LAB RESULTS: Lab Results  Component Value Date   WBC 33.5 (H) 05/25/2019   NEUTROABS 6.4 05/25/2019   HGB 14.8 05/25/2019   HCT 44.9 05/25/2019   MCV 89.6 05/25/2019   PLT 249 05/25/2019      Chemistry      Component Value Date/Time   NA 140 05/25/2019 1122   NA 140 08/06/2017 1119   K 4.7 05/25/2019 1122   K 4.9 08/06/2017 1119   CL 105 05/25/2019 1122   CL 103 06/16/2014 0617   CL 105 09/08/2012 1002   CO2 25 05/25/2019 1122   CO2 27 08/06/2017 1119   BUN 21 05/25/2019 1122   BUN 37.3 (H) 08/06/2017 1119   CREATININE 1.11 (H) 05/25/2019 1122   CREATININE 1.03 (H) 05/20/2019 1223   CREATININE 1.4 (H) 08/06/2017 1119      Component Value Date/Time   CALCIUM 9.7 05/25/2019 1122   CALCIUM 10.4 08/06/2017 1119   ALKPHOS 75 05/25/2019 1122   ALKPHOS 59 08/06/2017 1119   AST 29 05/25/2019 1122   AST 27 05/20/2019 1223   AST 47 (H) 08/06/2017 1119   ALT 31 05/25/2019 1122   ALT 27 05/20/2019 1223   ALT 43 08/06/2017 1119   BILITOT 0.4 05/25/2019 1122   BILITOT 0.4 05/20/2019 1223   BILITOT 0.48 08/06/2017 1119       Lab Results  Component Value Date   LABCA2 35 07/14/2012      STUDIES: Dg Foot Complete Left  Result Date: 05/14/2019 CLINICAL DATA:  Great toe pain EXAM: LEFT FOOT - COMPLETE 3+ VIEW COMPARISON:  None. FINDINGS: No fracture or malalignment. Moderate plantar calcaneal spur. Mild degenerative change of the first MTP joint. IMPRESSION: 1. No acute osseous abnormality 2. Mild degenerative change of the first MTP joint Electronically Signed   By: Donavan Foil M.D.   On: 05/14/2019 21:18    ASSESSMENT: 85 y.o. Haley Love,  woman s/p Left lumpectomy 07/28/2012 for a pT1c NX, stage IA invasive ductal carcinoma, grade 1, estrogen receptor 100% and  progesterone receptor 39% positive, with no HER-2 amplification and an MIB-1 of 7.   (1) initially positive margins were cleared 08/06/2012  (2) tamoxifen started October 2013, stopped March 2014 because of cramps and hip pain  (3) anastrozole started May 2014-- patient tells me she stopped after a few days "with the same symptoms as tamoxifen"  (4) patient chose to forego anti-estrogen therapy altogether  (  5) M-GUS documented 08/10/2014, with M spike of 0.45, normal total IgG, IgA and IgM, normal kappa/lambda ratio, no anemia, normal creatinine and calcium  (a) bone survey 09/06/2014 shows no pathologic lytic lesions  (b) lumbar spine sacrum and coccyx and left foot films December 2019 and June 2020 show no lytic lesions  (c) most recent M-spike stable at 0.3 (05/20/2019)  (6) 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  She understands the current problem has nothing to do with her earlier breast cancer.  We have discussed chronic lymphoid leukemia previously.  However most chronic lymphoid leukemia is are B-cell.  We had to go back and review the role of the immune system, the role of lymphocytes, and the types of lymphocytes so she could understand we are dealing with something different from what we had anticipated.  Her situation is not entirely clear.  She has a CD4 lymphocytosis.  This can be seen in some reactive situations such as CMV, or Epstein-Barr virus infections.  However she has no infectious symptoms and no "B" symptoms.  She also has  normal hemoglobin and platelet count, no immunoglobulin dysregulation, and a very stable minimal M spike with no significant kappa lambda ratio alteration.  It is interesting that after receiving a single dose of Depo-Medrol (for gout) her lymphocyte count dropped as noted above  I suggested she continue the allopurinol indefinitely.  Otherwise we are going to institute observation and she will have lab work every 2 months.  She will see me in 6 months.  While there is no indication for treatment at present she understands that we have more limited treatment options and T- cell neoplasms.  She made it very clear that she would not want very aggressive therapy given her age and the quality of life is paramount to her.  She knows to call for any other issue that may develop before the next visit.  , Virgie Dad, MD  05/26/19 4:40 PM Medical Oncology and Hematology [facility] [address] [place], [place] [address] Tel. [phone]    Fax. [phone]   I, Jacqualyn Posey am acting as a Education administrator for Chauncey Cruel, MD.   I, Lurline Del MD, have reviewed the above documentation for accuracy and completeness, and I agree with the above.

## 2019-05-30 ENCOUNTER — Encounter (HOSPITAL_COMMUNITY): Payer: Self-pay | Admitting: Oncology

## 2019-05-31 ENCOUNTER — Other Ambulatory Visit: Payer: Self-pay | Admitting: Oncology

## 2019-05-31 NOTE — Progress Notes (Signed)
ADDENDUM: PCR confirms T\cell receptor rearrangement in the beta gene variable and joining regions of Haley Love's lymphocytes, c/w a monoclonal CD4 cell population.

## 2019-06-01 ENCOUNTER — Telehealth: Payer: Self-pay | Admitting: Oncology

## 2019-06-01 NOTE — Telephone Encounter (Signed)
Scheduled appt per 7/07 sch message - pt aware of appts

## 2019-06-03 LAB — FLOW CYTOMETRY

## 2019-06-20 ENCOUNTER — Other Ambulatory Visit: Payer: Self-pay | Admitting: Oncology

## 2019-06-20 DIAGNOSIS — Z1231 Encounter for screening mammogram for malignant neoplasm of breast: Secondary | ICD-10-CM

## 2019-06-30 ENCOUNTER — Other Ambulatory Visit: Payer: Self-pay

## 2019-06-30 ENCOUNTER — Encounter: Payer: Self-pay | Admitting: Family Medicine

## 2019-06-30 ENCOUNTER — Ambulatory Visit (INDEPENDENT_AMBULATORY_CARE_PROVIDER_SITE_OTHER): Payer: Medicare HMO | Admitting: Family Medicine

## 2019-06-30 VITALS — Ht 66.5 in | Wt 170.0 lb

## 2019-06-30 DIAGNOSIS — M545 Low back pain, unspecified: Secondary | ICD-10-CM

## 2019-06-30 MED ORDER — METHYLPREDNISOLONE 4 MG PO TBPK: ORAL_TABLET | ORAL | 0 refills | Status: DC

## 2019-06-30 NOTE — Progress Notes (Signed)
Patient ID: Haley Love, female   DOB: 15-Feb-1934, 85 y.o.   MRN: [id number]    Virtual Visit via phone Note  This visit type was conducted due to national recommendations for restrictions regarding the COVID-19 pandemic (e.g. social distancing).  This format is felt to be most appropriate for this patient at this time.  All issues noted in this document were discussed and addressed.  No physical exam was performed (except for noted visual exam findings with Video Visits).   I connected with Haley Love today at 11:20 AM EDT by a video enabled telemedicine application and verified that I am speaking with the correct person using two identifiers. Location patient: home Location provider: work or home office Persons participating in the virtual visit: patient, provider  I discussed the limitations, risks, security and privacy concerns of performing an evaluation and management service by telephone and the availability of in person appointments. I also discussed with the patient that there may be a patient responsible charge related to this service. The patient expressed understanding and agreed to proceed.   HPI:  Patient and I connected via telephone to discuss bilateral low back pain that goes across both sides and also seems to travel up midline of her spine.  Denies any recent falls or injuries.  States she notices pain especially when turning from side to side, there will be occasions where the pain will "grab" her and she will have to stop and rest and allow time for pain to pass.  Doing gentle stretches has seemed to help pain.  States that she finally was able to get restful sleep last night after taking dose of Tylenol and anti-inflammatory supplement.  Denies any fever or chills.  Denies any numbness or tingling in lower extremities, no saddle anesthesia or loss of bowel or bladder control.  Denies any burning with urination or any urinary frequency increase.  Patient does have  history of CLL and is followed regularly by oncology.   ROS: See pertinent positives and negatives per HPI.  Past Medical History:  Diagnosis Date  . Breast cancer ([place])   . Cataract   . Chickenpox   . Contact lens/glasses fitting   . Diabetes mellitus without complication ([place])   . Hyperlipidemia   . Hypertension   . Inflammatory polyps of colon [facility])     Past Surgical History:  Procedure Laterality Date  . BREAST LUMPECTOMY Left 07/28/2012  . BREAST SURGERY  9/13   lt lump  . cataracts  2014  . COLONOSCOPY WITH PROPOFOL N/A 01/05/2017   Procedure: COLONOSCOPY WITH PROPOFOL;  Surgeon: Manya Silvas, MD;  Location: [facility] ENDOSCOPY;  Service: Endoscopy;  Laterality: N/A;  . JOINT REPLACEMENT Right July 2015   [place], right knee  . KNEE ARTHROSCOPY W/ MENISCAL REPAIR  09/23/11   Right  . MASTECTOMY, PARTIAL  08/06/2012   Procedure: MASTECTOMY PARTIAL;  Surgeon: Adin Hector, MD;  Location: [place];  Service: General;  Laterality: Left;  Left partial mastectomy with re-excision of margins    Family History  Problem Relation Age of Onset  . Lung cancer Mother   . Arthritis Mother   . Hyperlipidemia Mother   . Hypertension Mother   . Throat cancer Father   . Hyperlipidemia Father   . Cancer Paternal Aunt        breast  . Dementia Brother   . Leukemia Brother        lymphatic /CLL   Social History  Tobacco Use  . Smoking status: Never Smoker  . Smokeless tobacco: Never Used  Substance Use Topics  . Alcohol use: Yes    Comment: 1 glass wine week    Current Outpatient Medications:  .  allopurinol (ZYLOPRIM) 300 MG tablet, Take 1 tablet (300 mg total) by mouth daily., Disp: 90 tablet, Rfl: 4 .  aspirin 81 MG tablet, Take 81 mg by mouth daily., Disp: , Rfl:  .  b complex vitamins tablet, Take 1 tablet by mouth daily., Disp: , Rfl:  .  B Complex-C (RA B-COMPLEX/VITAMIN C CR) TBCR, Take by mouth., Disp: , Rfl:  .  Borage, Borago officinalis,  (BORAGE OIL PO), Take 1,000 mg by mouth daily., Disp: , Rfl:  .  Calcium Carbonate-Vit D-Min (RA CALCIUM 600/VIT D/MINERALS) 600-200 MG-UNIT TABS, Take tablets by mouth once a day., Disp: , Rfl:  .  Cholecalciferol (VITAMIN D-3) 1000 UNITS CAPS, Take 1 capsule by mouth at bedtime., Disp: , Rfl:  .  CINNAMON PO, Take 2,000 mg by mouth at bedtime., Disp: , Rfl:  .  Coenzyme Q10 (CO Q-10) 200 MG CAPS, Take 1 capsule by mouth at bedtime., Disp: , Rfl:  .  COLCRYS 0.6 MG tablet, TAKE 1 TABLET (0.6 MG TOTAL) BY MOUTH 2 (TWO) TIMES DAILY., Disp: 180 tablet, Rfl: 1 .  Collagenase POWD, by Does not apply route daily at 6 (six) AM., Disp: , Rfl:  .  doxycycline (VIBRA-TABS) 100 MG tablet, Take 1 tablet (100 mg total) by mouth 2 (two) times daily., Disp: 10 tablet, Rfl: 0 .  Flaxseed, Linseed, (FLAX SEED OIL) 1000 MG CAPS, Take by mouth., Disp: , Rfl:  .  Indole-3-Carbinol POWD, Take 200 mg by mouth 2 (two) times daily., Disp: , Rfl:  .  Krill Oil 1500 MG CAPS, Take by mouth., Disp: , Rfl:  .  Magnesium 500 MG CAPS, Take 1 capsule by mouth daily. , Disp: , Rfl:  .  Milk Thistle 300 MG CAPS, Take by mouth daily at 6 (six) AM., Disp: , Rfl:  .  Misc Natural Products (NARCOSOFT HERBAL LAX PO), Take 2 tablets by mouth at bedtime. , Disp: , Rfl:  .  mupirocin ointment (BACTROBAN) 2 %, Apply 1 application topically 2 (two) times daily., Disp: 30 g, Rfl: 0 .  Nutritional Supplements (NUTRITIONAL SUPPLEMENT PO), Take 3 capsules by mouth 2 (two) times daily. Neprinol AFD>, Disp: , Rfl:  .  Red Yeast Rice Extract (RED YEAST RICE PO), Take 1,000 mg by mouth 2 (two) times daily., Disp: , Rfl:  .  telmisartan (MICARDIS) 20 MG tablet, Take 1 tablet (20 mg total) by mouth at bedtime., Disp: 90 tablet, Rfl: 1 .  vitamin E 400 UNIT capsule, Take 400 Units by mouth daily., Disp: , Rfl:  .  methylPREDNISolone (MEDROL DOSEPAK) 4 MG TBPK tablet, Take according to pack instructions., Disp: 21 tablet, Rfl: 0  EXAM:  GENERAL:  alert, oriented, sounds well and in no acute distress  LUNGS: Speaking in full sentences, no signs of respiratory distress, breathing rate appears normal, no obvious gross SOB, gasping or wheezing  PSYCH/NEURO: pleasant and cooperative, no obvious depression or anxiety, speech and thought processing grossly intact  ASSESSMENT AND PLAN:  Discussed the following assessment and plan:  Low back pain -- Patient's description of back pain does sound musculoskeletal in nature.  Advised to do daily stretching and range of motion exercises, use topical rub like a BenGay or Biofreeze and she will also take low-dose oral  steroid taper.  Also advised patient to reach out to her oncologist to make them aware of back pain.  If our current treatment plan to reduce back pain does not work, further investigation may be warranted.   I discussed the assessment and treatment plan with the patient. The patient was provided an opportunity to ask questions and all were answered. The patient agreed with the plan and demonstrated an understanding of the instructions.   The patient was advised to call back or seek an in-person evaluation if the symptoms worsen or if the condition fails to improve as anticipated.  I provided 15 minutes of non-face-to-face time during this encounter.   Jodelle Green, FNP

## 2019-07-23 ENCOUNTER — Other Ambulatory Visit: Payer: Self-pay | Admitting: Internal Medicine

## 2019-08-04 ENCOUNTER — Ambulatory Visit
Admission: RE | Admit: 2019-08-04 | Discharge: 2019-08-04 | Disposition: A | Discharge status: Home / Self Care | Payer: Medicare HMO | Source: Ambulatory Visit | Attending: Oncology | Admitting: Oncology

## 2019-08-04 ENCOUNTER — Other Ambulatory Visit: Payer: Self-pay

## 2019-08-04 DIAGNOSIS — Z1231 Encounter for screening mammogram for malignant neoplasm of breast: Secondary | ICD-10-CM

## 2019-08-11 ENCOUNTER — Inpatient Hospital Stay: Payer: Medicare HMO | Attending: Oncology

## 2019-08-11 ENCOUNTER — Other Ambulatory Visit: Payer: Self-pay

## 2019-08-11 DIAGNOSIS — Z170 Estrogen receptor positive status [ER+]: Secondary | ICD-10-CM | POA: Insufficient documentation

## 2019-08-11 DIAGNOSIS — C50512 Malignant neoplasm of lower-outer quadrant of left female breast: Secondary | ICD-10-CM | POA: Insufficient documentation

## 2019-08-11 LAB — CBC WITH DIFFERENTIAL/PLATELET
Abs Immature Granulocytes: 0.07 10*3/uL (ref 0.00–0.07)
Basophils Absolute: 0.1 10*3/uL (ref 0.0–0.1)
Basophils Relative: 0 %
Eosinophils Absolute: 0.2 10*3/uL (ref 0.0–0.5)
Eosinophils Relative: 1 %
HCT: 41.2 % (ref 36.0–46.0)
Hemoglobin: 13.3 g/dL (ref 12.0–15.0)
Immature Granulocytes: 0 %
Lymphocytes Relative: 71 %
Lymphs Abs: 26.4 10*3/uL — ABNORMAL HIGH (ref 0.7–4.0)
MCH: 30 pg (ref 26.0–34.0)
MCHC: 32.3 g/dL (ref 30.0–36.0)
MCV: 93 fL (ref 80.0–100.0)
Monocytes Absolute: 4.1 10*3/uL — ABNORMAL HIGH (ref 0.1–1.0)
Monocytes Relative: 11 %
Neutro Abs: 6.2 10*3/uL (ref 1.7–7.7)
Neutrophils Relative %: 17 %
Platelets: 219 10*3/uL (ref 150–400)
RBC: 4.43 MIL/uL (ref 3.87–5.11)
RDW: 13.8 % (ref 11.5–15.5)
WBC: 37 10*3/uL — ABNORMAL HIGH (ref 4.0–10.5)
nRBC: 0 % (ref 0.0–0.2)

## 2019-08-11 LAB — COMPREHENSIVE METABOLIC PANEL
ALT: 23 U/L (ref 0–44)
AST: 31 U/L (ref 15–41)
Albumin: 4 g/dL (ref 3.5–5.0)
Alkaline Phosphatase: 61 U/L (ref 38–126)
Anion gap: 7 (ref 5–15)
BUN: 21 mg/dL (ref 8–23)
CO2: 28 mmol/L (ref 22–32)
Calcium: 9.7 mg/dL (ref 8.9–10.3)
Chloride: 106 mmol/L (ref 98–111)
Creatinine, Ser: 1.09 mg/dL — ABNORMAL HIGH (ref 0.44–1.00)
GFR calc Af Amer: 55 mL/min — ABNORMAL LOW (ref >60)
GFR calc non Af Amer: 47 mL/min — ABNORMAL LOW (ref >60)
Glucose, Bld: 124 mg/dL — ABNORMAL HIGH (ref 70–99)
Potassium: 4.9 mmol/L (ref 3.5–5.1)
Sodium: 141 mmol/L (ref 135–145)
Total Bilirubin: 0.6 mg/dL (ref 0.3–1.2)
Total Protein: 6.9 g/dL (ref 6.5–8.1)

## 2019-08-15 ENCOUNTER — Other Ambulatory Visit: Payer: Self-pay | Admitting: Oncology

## 2019-08-15 ENCOUNTER — Encounter: Payer: Self-pay | Admitting: Oncology

## 2019-09-08 ENCOUNTER — Ambulatory Visit: Payer: Medicare HMO | Admitting: Oncology

## 2019-10-06 ENCOUNTER — Other Ambulatory Visit: Payer: Self-pay

## 2019-10-06 ENCOUNTER — Inpatient Hospital Stay: Payer: Medicare HMO | Attending: Oncology

## 2019-10-06 DIAGNOSIS — C9110 Chronic lymphocytic leukemia of B-cell type not having achieved remission: Secondary | ICD-10-CM | POA: Diagnosis not present

## 2019-10-06 DIAGNOSIS — C50512 Malignant neoplasm of lower-outer quadrant of left female breast: Secondary | ICD-10-CM | POA: Diagnosis not present

## 2019-10-06 DIAGNOSIS — D472 Monoclonal gammopathy: Secondary | ICD-10-CM | POA: Diagnosis not present

## 2019-10-06 LAB — CBC WITH DIFFERENTIAL/PLATELET
Abs Immature Granulocytes: 0 10*3/uL (ref 0.00–0.07)
Basophils Absolute: 0 10*3/uL (ref 0.0–0.1)
Basophils Relative: 0 %
Eosinophils Absolute: 0.4 10*3/uL (ref 0.0–0.5)
Eosinophils Relative: 1 %
HCT: 44.2 % (ref 36.0–46.0)
Hemoglobin: 14.3 g/dL (ref 12.0–15.0)
Lymphocytes Relative: 87 %
Lymphs Abs: 34.3 10*3/uL — ABNORMAL HIGH (ref 0.7–4.0)
MCH: 30.1 pg (ref 26.0–34.0)
MCHC: 32.4 g/dL (ref 30.0–36.0)
MCV: 93.1 fL (ref 80.0–100.0)
Monocytes Absolute: 1.2 10*3/uL — ABNORMAL HIGH (ref 0.1–1.0)
Monocytes Relative: 3 %
Neutro Abs: 3.5 10*3/uL (ref 1.7–7.7)
Neutrophils Relative %: 9 %
Platelets: 212 10*3/uL (ref 150–400)
RBC: 4.75 MIL/uL (ref 3.87–5.11)
RDW: 12.8 % (ref 11.5–15.5)
WBC: 39.4 10*3/uL — ABNORMAL HIGH (ref 4.0–10.5)
nRBC: 0 % (ref 0.0–0.2)

## 2019-10-06 LAB — COMPREHENSIVE METABOLIC PANEL
ALT: 30 U/L (ref 0–44)
AST: 30 U/L (ref 15–41)
Albumin: 4.1 g/dL (ref 3.5–5.0)
Alkaline Phosphatase: 72 U/L (ref 38–126)
Anion gap: 8 (ref 5–15)
BUN: 20 mg/dL (ref 8–23)
CO2: 28 mmol/L (ref 22–32)
Calcium: 9.6 mg/dL (ref 8.9–10.3)
Chloride: 106 mmol/L (ref 98–111)
Creatinine, Ser: 1.01 mg/dL — ABNORMAL HIGH (ref 0.44–1.00)
GFR calc Af Amer: >60 mL/min (ref >60)
GFR calc non Af Amer: 52 mL/min — ABNORMAL LOW (ref >60)
Glucose, Bld: 117 mg/dL — ABNORMAL HIGH (ref 70–99)
Potassium: 4.7 mmol/L (ref 3.5–5.1)
Sodium: 142 mmol/L (ref 135–145)
Total Bilirubin: 0.5 mg/dL (ref 0.3–1.2)
Total Protein: 7.1 g/dL (ref 6.5–8.1)

## 2019-11-08 ENCOUNTER — Telehealth: Payer: Self-pay | Admitting: Oncology

## 2019-11-08 NOTE — Telephone Encounter (Signed)
Called pt per 12/14 sch message - no answer. Left message for patient to call back if reschedule is still needed

## 2019-11-11 ENCOUNTER — Telehealth: Payer: Self-pay | Admitting: Internal Medicine

## 2019-11-11 NOTE — Telephone Encounter (Signed)
Spoke with pt and she stated that she was able to get in to see the PA at Dr. Clydell Hakim office on Monday.

## 2019-11-11 NOTE — Telephone Encounter (Signed)
Patient has pain in lower back, down the hip on both sides, pain goes down to her knee. No appointments available.

## 2019-11-14 DIAGNOSIS — M5442 Lumbago with sciatica, left side: Secondary | ICD-10-CM | POA: Diagnosis not present

## 2019-11-14 DIAGNOSIS — M5416 Radiculopathy, lumbar region: Secondary | ICD-10-CM | POA: Diagnosis not present

## 2019-11-14 DIAGNOSIS — M25552 Pain in left hip: Secondary | ICD-10-CM | POA: Diagnosis not present

## 2019-11-14 DIAGNOSIS — G8929 Other chronic pain: Secondary | ICD-10-CM | POA: Diagnosis not present

## 2019-12-01 ENCOUNTER — Ambulatory Visit: Payer: Medicare HMO | Admitting: Oncology

## 2019-12-01 ENCOUNTER — Other Ambulatory Visit: Payer: Medicare HMO

## 2019-12-04 NOTE — Progress Notes (Signed)
[facility]  [phone] [phone]     ID: Haley Love   DOB: 1934-08-24  MR#: [id number]  ERD#:[id number]   Patient Care Team: Crecencio Mc, MD as PCP - General (Internal Medicine) Ilana Prezioso, Virgie Dad, MD as Consulting Physician (Oncology)   CHIEF COMPLAINT: left breast cancer; M-GUS, T-cell CLL  CURRENT THERAPY: observation   INTERVAL HISTORY: Haley Love returns today for follow-up of her history of breast cancer and her monoclonal gammopathy and now T-cell CLL. She continues under observation for the above.  Since her last visit, she underwent bilateral screening mammography with tomography at [facility] on 08/04/2019 showing: breast density category B; no evidence of malignancy in either breast.   We are following her lymphocyte count and her M protein: Results for Haley, Love (MRN [id number]) as of 12/05/2019 13:39  Ref. Range 09/01/2014 10:48 02/19/2015 10:43 07/26/2015 10:15 07/29/2016 10:56 08/06/2017 11:19  lymph# Latest Ref Range: 0.9 - 3.3 10e3/uL 6.2 (H) 5.5 (H) 6.9 (H) 6.3 (H) 8.3 (H)   Results for Haley, Love (MRN [id number]) as of 12/05/2019 13:39  Ref. Range 05/20/2019 12:24  M Protein SerPl Elph-Mcnc Latest Ref Range: Not Observed g/dL 0.3 (H)   REVIEW OF SYSTEMS: Haley Love has been having low back pain.  She sees Dr. Marry Guan in orthopedics and she tells me all his studies are "fine".  She does not want to receive "shots".  She is taking "relief factor, which she saw advertised on TV, and she also takes Aleve 3 tablets daily.  She does not think she is drinking that much water.  She is able to walk a mile and sometimes 76mles most days.  A detailed review of systems today was otherwise stable   HISTORY OF T-CELL CHRONIC LYMPHOID LEUKEMIA: From the prior summary note:  Haley Love a history of lymphocytosis dating back at least to August 10, 2014 when her absolute lymphocyte count was 6.6.  This remained stable  through 2018, but on 02/01/2018 blood counts doubled to 13.6.  The counts remained stable for some months, but a year and a half later on 05/13/2019 they had doubled again to 31.6.    Her counts since that time have actually been decreasing, likely secondary to a dose of Solu-Medrol she received for her gout, as follows Results for Haley, Love(MRN [id number] as of 05/26/2019 14:57  Ref. Range 05/13/2019 14:16 05/20/2019 12:23 05/25/2019 11:22  Lymphocyte # Latest Ref Range: 0.7 - 4.0 K/uL 31.6 (H) 25.8 (H) 23.8 (H)   Recall that she is known to have an IgG lambda monoclonal gammopathy of uncertain significance, dating back 5 years, with no evidence of progression.   Results for Haley, Love(MRN [id number] as of 05/26/2019 14:57  Ref. Range 08/10/2014 11:30 09/01/2014 10:48 07/29/2016 10:56 08/06/2017 11:59  M-SPIKE, % Latest Ref Range: Not Observed g/dL 0.45 0.45 0.4 (H) 0.4 (H)   Kappa lambda ratios have been unremarkable Results for Haley, Love(MRN [id number] as of 05/26/2019 14:57  Ref. Range 09/01/2014 10:48 02/19/2015 10:43 07/26/2015 10:15 07/29/2016 10:56 08/05/2017 11:17 08/06/2017 11:59 08/06/2018 10:14 05/20/2019 12:23  Kappa:Lambda Ratio Latest Ref Range: 0.26 - 1.65  0.70 1.37 1.29  1.22  1.43    Quantitative immunoglobulins were normal when most recently checked, 05/20/2019.    Finally she underwent flow cytometry 05/20/2019, showing ([id number]) a total lymphocyte population of 21.47 with overwhelming predominance of T cells.  There were 93% of all lymphocytes and they were  all CD4 positive.  There was no significant CD8 CD34 or CD56 expression and importantly there was no missing CD2, CD3, CD5, or CD7.  T-cell receptor rearrangement was requested 05/25/2019   BREAST CANCER HISTORY: From the original intake note:  Haley Love had screening mammography at the [facility] Center 06/17/2012 which showed a possible mass in the left breast. Left diagnostic mammography performed 07/02/2012 showed a  persistent 1 cm spiculated mass in the lateral left breast, which was palpable and firm. Ultrasound showed an irregular hypoechoic mass measuring 8 mm. The left axilla was unremarkable.  Biopsy of the mass the same day ([id number]) showed an invasive ductal carcinoma, grade 1, estrogen receptor 100% progesterone receptor 39% positive, with no HER-2 amplification and an MIB-1 of 7%. Bilateral breast MRI were obtained 07/08/2012 this showed an irregular enhancing mass in the left breast, middle third, measuring 9 mm. There were no other suspicious masses in either breast and no axillary or internal mammary adenopathy.  The patient's subsequent history is as detailed below.   PAST MEDICAL HISTORY: Past Medical History:  Diagnosis Date  . Breast cancer ([place])   . Cataract   . Chickenpox   . Contact lens/glasses fitting   . Diabetes mellitus without complication ([place])   . Hyperlipidemia   . Hypertension   . Inflammatory polyps of colon ([place])     PAST SURGICAL HISTORY: Past Surgical History:  Procedure Laterality Date  . BREAST LUMPECTOMY Left 07/28/2012  . BREAST SURGERY  9/13   lt lump  . cataracts  2014  . COLONOSCOPY WITH PROPOFOL N/A 01/05/2017   Procedure: COLONOSCOPY WITH PROPOFOL;  Surgeon: Manya Silvas, MD;  Location: [facility] END[facility]COPY;  Service: Endoscopy;  Laterality: N/A;  . JOINT REPLACEMENT Right July 2015   [place], right knee  . KNEE ARTHROSCOPY W/ MENISCAL REPAIR  09/23/11   Right  . MASTECTOMY, PARTIAL  08/06/2012   Procedure: MASTECTOMY PARTIAL;  Surgeon: Adin Hector, MD;  Location: [place];  Service: General;  Laterality: Left;  Left partial mastectomy with re-excision of margins    FAMILY HISTORY Family History  Problem Relation Age of Onset  . Lung cancer Mother   . Arthritis Mother   . Hyperlipidemia Mother   . Hypertension Mother   . Throat cancer Father   . Hyperlipidemia Father   . Cancer Paternal Aunt        breast  . Dementia  Brother   . Leukemia Brother        lymphatic /CLL   the patient's father died at the age of 12 from cancer of the throat which had been diagnosed 6 years earlier. The patient's mother died at the age of 45. She had cancer of the lung lining diagnosed at age 54. The patient had 4 brothers, no sisters. There is no other cancer history in the immediate family.   GYNECOLOGIC HISTORY: Menarche age 54. Change of life age 35. She took hormone replacement approximately 10 years. She is GX P2, with first live birth at age 51.   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 She is divorced. Her son Haley Love (goes by "J.") also lives in [place] and is self-employed running a Education administrator business. Daughter Haley Love lives in [place]. She works as a Electrical engineer. The patient has 3 grandchildren. She attends a [employer].   ADVANCED DIRECTIVES: In place   HEALTH MAINTENANCE: Social History   Tobacco Use  .  Smoking status: Never Smoker  . Smokeless tobacco: Never Used  Substance Use Topics  . Alcohol use: Yes    Comment: 1 glass wine week  . Drug use: No     Colonoscopy: 2012  PAP: 2011  Bone density: 2011  Lipid panel:  Allergies  Allergen Reactions  . No Known Allergies    OBJECTIVE: Older white woman in no acute distress  ECOG: 1  Vitals:   12/05/19 1336  BP: (!) 169/71  Pulse: 79  Resp: 18  Temp: 98.3 F (36.8 C)  SpO2: 96%  Body mass index is 28.35 kg/m.  Sclerae unicteric, EOMs intact Wearing a mask No cervical or supraclavicular adenopathy, no axillary adenopathy Lungs no rales or rhonchi Heart regular rate and rhythm Abd soft, nontender, positive bowel sounds MSK no focal spinal tenderness, no upper extremity lymphedema Neuro: nonfocal, well oriented, appropriate affect Breasts: Deferred   LAB RESULTS: Lab Results  Component Value Date   WBC 38.1 (H) 12/05/2019   NEUTROABS PENDING  12/05/2019   HGB 13.9 12/05/2019   HCT 42.6 12/05/2019   MCV 93.6 12/05/2019   PLT 227 12/05/2019      Chemistry      Component Value Date/Time   NA 142 10/06/2019 1036   NA 140 08/06/2017 1119   K 4.7 10/06/2019 1036   K 4.9 08/06/2017 1119   CL 106 10/06/2019 1036   CL 103 06/16/2014 0617   CL 105 09/08/2012 1002   CO2 28 10/06/2019 1036   CO2 27 08/06/2017 1119   BUN 20 10/06/2019 1036   BUN 37.3 (H) 08/06/2017 1119   CREATININE 1.01 (H) 10/06/2019 1036   CREATININE 1.03 (H) 05/20/2019 1223   CREATININE 1.4 (H) 08/06/2017 1119      Component Value Date/Time   CALCIUM 9.6 10/06/2019 1036   CALCIUM 10.4 08/06/2017 1119   ALKPHOS 72 10/06/2019 1036   ALKPHOS 59 08/06/2017 1119   AST 30 10/06/2019 1036   AST 27 05/20/2019 1223   AST 47 (H) 08/06/2017 1119   ALT 30 10/06/2019 1036   ALT 27 05/20/2019 1223   ALT 43 08/06/2017 1119   BILITOT 0.5 10/06/2019 1036   BILITOT 0.4 05/20/2019 1223   BILITOT 0.48 08/06/2017 1119      Lab Results  Component Value Date   LABCA2 35 07/14/2012    STUDIES: No results found.   ASSESSMENT: 86 y.o. Haley Love, [place] woman s/p Left lumpectomy 07/28/2012 for a pT1c NX, stage IA invasive ductal carcinoma, grade 1, estrogen receptor 100% and progesterone receptor 39% positive, with no HER-2 amplification and an MIB-1 of 7.   (1) initially positive margins were cleared 08/06/2012  (2) tamoxifen started October 2013, stopped March 2014 because of cramps and hip pain  (3) anastrozole started May 2014-- patient tells me she stopped after a few days "with the same symptoms as tamoxifen"  (4) patient chose to forego anti-estrogen therapy altogether  (5) M-GUS documented 08/10/2014, with M spike of 0.45, normal total IgG, IgA and IgM, normal kappa/lambda ratio, no anemia, normal creatinine and calcium  (a) bone survey 09/06/2014 shows no pathologic lytic lesions  (b) lumbar spine sacrum and coccyx and left foot films December 2019 and June  2020 show no lytic lesions  (c) most recent M-spike stable at 0.3 (05/20/2019)  (6) T-CELL CLL diagnosed by flow cytometry 05/20/2019, CD4 positive, no loss of CD2/3/5/7  (a) doubling time 12 months (Sept 2018 to Sept 2019)  (b) next doubling 9 months (Sept 2019  to June 2020)  (c) baseline labs 05/20/2019: beta-2-microglobulin 2.5, LDH 271, no anemia or thrombocytopenia, normal IgG,A/M levels  (d) TCR rearrangement 05/30/2019 positive (indicates clonality)   PLAN:  Haley Love currently is clinically very stable and has no "B" symptoms.  We discussed her numbers at length so she would understand what we are following.  Her white cell count and lymphocyte count are not trending up steeply.  She understands that would need to double within 6 months before we would consider initiating treatment.  In addition she has no anemia or thrombocytopenia.  Accordingly we are going to continue simple follow-up.  She will have lab work in March and every 3 months thereafter for the next year.  In the absence of any indications for treatment then she will see me again in a year and at that point we will broaden the follow-up of lab interval to every 4 months.  We discussed the Covid vaccine and I have strongly encouraged her to receive it as soon as it becomes available to her.  She should be in the top tier of course given her age.  She is aware that Aleve and similar's can affect kidney function.  I strongly urged her to drink more water during the day.  Total encounter time 35 minutes*   Haley Love, Virgie Dad, MD  12/05/19 1:40 PM Medical Oncology and Hematology [facility] [place], [place] [address] Tel. [phone]    Fax. [phone]    I, Wilburn Mylar, am acting as scribe for Dr. Virgie Dad. Haley Love.  I, Lurline Del MD, have reviewed the above documentation for accuracy and completeness, and I agree with the above.    *Total Encounter Time as defined by the  Centers for Medicare and Medicaid Services includes, in addition to the face-to-face time of a patient visit (documented in the note above) non-face-to-face time: obtaining and reviewing outside history, ordering and reviewing medications, tests or procedures, care coordination (communications with other health care professionals or caregivers) and documentation in the medical record.

## 2019-12-05 ENCOUNTER — Inpatient Hospital Stay: Payer: Medicare HMO | Attending: Oncology

## 2019-12-05 ENCOUNTER — Other Ambulatory Visit: Payer: Self-pay

## 2019-12-05 ENCOUNTER — Inpatient Hospital Stay: Payer: Medicare HMO | Admitting: Oncology

## 2019-12-05 VITALS — BP 169/71 | HR 79 | Temp 98.3°F | Resp 18 | Ht 66.5 in | Wt 178.3 lb

## 2019-12-05 DIAGNOSIS — D472 Monoclonal gammopathy: Secondary | ICD-10-CM

## 2019-12-05 DIAGNOSIS — C9110 Chronic lymphocytic leukemia of B-cell type not having achieved remission: Secondary | ICD-10-CM | POA: Diagnosis not present

## 2019-12-05 DIAGNOSIS — E119 Type 2 diabetes mellitus without complications: Secondary | ICD-10-CM | POA: Insufficient documentation

## 2019-12-05 DIAGNOSIS — Z170 Estrogen receptor positive status [ER+]: Secondary | ICD-10-CM | POA: Diagnosis not present

## 2019-12-05 DIAGNOSIS — Z801 Family history of malignant neoplasm of trachea, bronchus and lung: Secondary | ICD-10-CM | POA: Diagnosis not present

## 2019-12-05 DIAGNOSIS — C50512 Malignant neoplasm of lower-outer quadrant of left female breast: Secondary | ICD-10-CM | POA: Diagnosis not present

## 2019-12-05 DIAGNOSIS — Z79899 Other long term (current) drug therapy: Secondary | ICD-10-CM | POA: Insufficient documentation

## 2019-12-05 DIAGNOSIS — Z803 Family history of malignant neoplasm of breast: Secondary | ICD-10-CM | POA: Insufficient documentation

## 2019-12-05 DIAGNOSIS — Z806 Family history of leukemia: Secondary | ICD-10-CM | POA: Insufficient documentation

## 2019-12-05 DIAGNOSIS — E785 Hyperlipidemia, unspecified: Secondary | ICD-10-CM | POA: Diagnosis not present

## 2019-12-05 DIAGNOSIS — C91Z0 Other lymphoid leukemia not having achieved remission: Secondary | ICD-10-CM | POA: Diagnosis not present

## 2019-12-05 DIAGNOSIS — I10 Essential (primary) hypertension: Secondary | ICD-10-CM | POA: Insufficient documentation

## 2019-12-05 DIAGNOSIS — Z853 Personal history of malignant neoplasm of breast: Secondary | ICD-10-CM | POA: Diagnosis not present

## 2019-12-05 LAB — CBC WITH DIFFERENTIAL/PLATELET
Abs Immature Granulocytes: 0.06 10*3/uL (ref 0.00–0.07)
Basophils Absolute: 0.2 10*3/uL — ABNORMAL HIGH (ref 0.0–0.1)
Basophils Relative: 1 %
Eosinophils Absolute: 0.2 10*3/uL (ref 0.0–0.5)
Eosinophils Relative: 1 %
HCT: 42.6 % (ref 36.0–46.0)
Hemoglobin: 13.9 g/dL (ref 12.0–15.0)
Immature Granulocytes: 0 %
Lymphocytes Relative: 77 %
Lymphs Abs: 29.9 10*3/uL — ABNORMAL HIGH (ref 0.7–4.0)
MCH: 30.5 pg (ref 26.0–34.0)
MCHC: 32.6 g/dL (ref 30.0–36.0)
MCV: 93.6 fL (ref 80.0–100.0)
Monocytes Absolute: 3.4 10*3/uL — ABNORMAL HIGH (ref 0.1–1.0)
Monocytes Relative: 9 %
Neutro Abs: 4.4 10*3/uL (ref 1.7–7.7)
Neutrophils Relative %: 12 %
Platelets: 227 10*3/uL (ref 150–400)
RBC: 4.55 MIL/uL (ref 3.87–5.11)
RDW: 13.6 % (ref 11.5–15.5)
WBC: 38.1 10*3/uL — ABNORMAL HIGH (ref 4.0–10.5)
nRBC: 0 % (ref 0.0–0.2)

## 2019-12-05 LAB — LACTATE DEHYDROGENASE: LDH: 314 U/L — ABNORMAL HIGH (ref 98–192)

## 2019-12-05 LAB — COMPREHENSIVE METABOLIC PANEL
ALT: 30 U/L (ref 0–44)
AST: 29 U/L (ref 15–41)
Albumin: 4.1 g/dL (ref 3.5–5.0)
Alkaline Phosphatase: 67 U/L (ref 38–126)
Anion gap: 10 (ref 5–15)
BUN: 24 mg/dL — ABNORMAL HIGH (ref 8–23)
CO2: 25 mmol/L (ref 22–32)
Calcium: 9.4 mg/dL (ref 8.9–10.3)
Chloride: 106 mmol/L (ref 98–111)
Creatinine, Ser: 1.1 mg/dL — ABNORMAL HIGH (ref 0.44–1.00)
GFR calc Af Amer: 54 mL/min — ABNORMAL LOW (ref >60)
GFR calc non Af Amer: 46 mL/min — ABNORMAL LOW (ref >60)
Glucose, Bld: 94 mg/dL (ref 70–99)
Potassium: 3.9 mmol/L (ref 3.5–5.1)
Sodium: 141 mmol/L (ref 135–145)
Total Bilirubin: 0.4 mg/dL (ref 0.3–1.2)
Total Protein: 7.2 g/dL (ref 6.5–8.1)

## 2019-12-06 LAB — KAPPA/LAMBDA LIGHT CHAINS
Kappa free light chain: 17.7 mg/L (ref 3.3–19.4)
Kappa, lambda light chain ratio: 1.46 (ref 0.26–1.65)
Lambda free light chains: 12.1 mg/L (ref 5.7–26.3)

## 2019-12-08 LAB — MULTIPLE MYELOMA PANEL, SERUM
Albumin SerPl Elph-Mcnc: 3.9 g/dL (ref 2.9–4.4)
Albumin/Glob SerPl: 1.4 (ref 0.7–1.7)
Alpha 1: 0.2 g/dL (ref 0.0–0.4)
Alpha2 Glob SerPl Elph-Mcnc: 0.8 g/dL (ref 0.4–1.0)
B-Globulin SerPl Elph-Mcnc: 1.1 g/dL (ref 0.7–1.3)
Gamma Glob SerPl Elph-Mcnc: 0.7 g/dL (ref 0.4–1.8)
Globulin, Total: 2.8 g/dL (ref 2.2–3.9)
IgA: 240 mg/dL (ref 64–422)
IgG (Immunoglobin G), Serum: 842 mg/dL (ref 586–1602)
IgM (Immunoglobulin M), Srm: 70 mg/dL (ref 26–217)
M Protein SerPl Elph-Mcnc: 0.3 g/dL — ABNORMAL HIGH
Total Protein ELP: 6.7 g/dL (ref 6.0–8.5)

## 2019-12-09 ENCOUNTER — Encounter: Payer: Self-pay | Admitting: Oncology

## 2020-01-19 ENCOUNTER — Other Ambulatory Visit: Payer: Self-pay | Admitting: Internal Medicine

## 2020-01-24 DIAGNOSIS — L570 Actinic keratosis: Secondary | ICD-10-CM | POA: Diagnosis not present

## 2020-01-24 DIAGNOSIS — Z85828 Personal history of other malignant neoplasm of skin: Secondary | ICD-10-CM | POA: Diagnosis not present

## 2020-01-24 DIAGNOSIS — C44319 Basal cell carcinoma of skin of other parts of face: Secondary | ICD-10-CM | POA: Diagnosis not present

## 2020-01-24 DIAGNOSIS — C44619 Basal cell carcinoma of skin of left upper limb, including shoulder: Secondary | ICD-10-CM | POA: Diagnosis not present

## 2020-01-24 DIAGNOSIS — L820 Inflamed seborrheic keratosis: Secondary | ICD-10-CM | POA: Diagnosis not present

## 2020-01-24 DIAGNOSIS — L821 Other seborrheic keratosis: Secondary | ICD-10-CM | POA: Diagnosis not present

## 2020-01-24 DIAGNOSIS — D2361 Other benign neoplasm of skin of right upper limb, including shoulder: Secondary | ICD-10-CM | POA: Diagnosis not present

## 2020-01-24 DIAGNOSIS — D2272 Melanocytic nevi of left lower limb, including hip: Secondary | ICD-10-CM | POA: Diagnosis not present

## 2020-01-24 DIAGNOSIS — D2261 Melanocytic nevi of right upper limb, including shoulder: Secondary | ICD-10-CM | POA: Diagnosis not present

## 2020-01-24 DIAGNOSIS — D485 Neoplasm of uncertain behavior of skin: Secondary | ICD-10-CM | POA: Diagnosis not present

## 2020-01-24 DIAGNOSIS — L538 Other specified erythematous conditions: Secondary | ICD-10-CM | POA: Diagnosis not present

## 2020-01-24 DIAGNOSIS — D2262 Melanocytic nevi of left upper limb, including shoulder: Secondary | ICD-10-CM | POA: Diagnosis not present

## 2020-01-24 DIAGNOSIS — C44629 Squamous cell carcinoma of skin of left upper limb, including shoulder: Secondary | ICD-10-CM | POA: Diagnosis not present

## 2020-01-26 ENCOUNTER — Other Ambulatory Visit: Payer: Self-pay

## 2020-01-26 ENCOUNTER — Inpatient Hospital Stay: Payer: Medicare HMO | Attending: Oncology

## 2020-01-26 DIAGNOSIS — C50512 Malignant neoplasm of lower-outer quadrant of left female breast: Secondary | ICD-10-CM | POA: Insufficient documentation

## 2020-01-26 DIAGNOSIS — C9110 Chronic lymphocytic leukemia of B-cell type not having achieved remission: Secondary | ICD-10-CM | POA: Diagnosis not present

## 2020-01-26 DIAGNOSIS — D472 Monoclonal gammopathy: Secondary | ICD-10-CM

## 2020-01-26 DIAGNOSIS — Z170 Estrogen receptor positive status [ER+]: Secondary | ICD-10-CM | POA: Insufficient documentation

## 2020-01-26 DIAGNOSIS — C91Z0 Other lymphoid leukemia not having achieved remission: Secondary | ICD-10-CM

## 2020-01-26 LAB — CBC WITH DIFFERENTIAL/PLATELET
Abs Immature Granulocytes: 0.09 10*3/uL — ABNORMAL HIGH (ref 0.00–0.07)
Basophils Absolute: 0.1 10*3/uL (ref 0.0–0.1)
Basophils Relative: 0 %
Eosinophils Absolute: 0.1 10*3/uL (ref 0.0–0.5)
Eosinophils Relative: 0 %
HCT: 43.7 % (ref 36.0–46.0)
Hemoglobin: 14.5 g/dL (ref 12.0–15.0)
Immature Granulocytes: 0 %
Lymphocytes Relative: 82 %
Lymphs Abs: 35.5 10*3/uL — ABNORMAL HIGH (ref 0.7–4.0)
MCH: 30.6 pg (ref 26.0–34.0)
MCHC: 33.2 g/dL (ref 30.0–36.0)
MCV: 92.2 fL (ref 80.0–100.0)
Monocytes Absolute: 3.4 10*3/uL — ABNORMAL HIGH (ref 0.1–1.0)
Monocytes Relative: 8 %
Neutro Abs: 4.4 10*3/uL (ref 1.7–7.7)
Neutrophils Relative %: 10 %
Platelets: 223 10*3/uL (ref 150–400)
RBC: 4.74 MIL/uL (ref 3.87–5.11)
RDW: 13.2 % (ref 11.5–15.5)
WBC: 43.6 10*3/uL — ABNORMAL HIGH (ref 4.0–10.5)
nRBC: 0 % (ref 0.0–0.2)

## 2020-01-26 LAB — COMPREHENSIVE METABOLIC PANEL
ALT: 27 U/L (ref 0–44)
AST: 28 U/L (ref 15–41)
Albumin: 4.1 g/dL (ref 3.5–5.0)
Alkaline Phosphatase: 76 U/L (ref 38–126)
Anion gap: 6 (ref 5–15)
BUN: 22 mg/dL (ref 8–23)
CO2: 28 mmol/L (ref 22–32)
Calcium: 9.9 mg/dL (ref 8.9–10.3)
Chloride: 108 mmol/L (ref 98–111)
Creatinine, Ser: 1.03 mg/dL — ABNORMAL HIGH (ref 0.44–1.00)
GFR calc Af Amer: 58 mL/min — ABNORMAL LOW (ref >60)
GFR calc non Af Amer: 50 mL/min — ABNORMAL LOW (ref >60)
Glucose, Bld: 94 mg/dL (ref 70–99)
Potassium: 4.6 mmol/L (ref 3.5–5.1)
Sodium: 142 mmol/L (ref 135–145)
Total Bilirubin: 0.4 mg/dL (ref 0.3–1.2)
Total Protein: 7.2 g/dL (ref 6.5–8.1)

## 2020-01-26 LAB — LACTATE DEHYDROGENASE: LDH: 310 U/L — ABNORMAL HIGH (ref 98–192)

## 2020-01-27 ENCOUNTER — Encounter: Payer: Self-pay | Admitting: Oncology

## 2020-01-27 LAB — KAPPA/LAMBDA LIGHT CHAINS
Kappa free light chain: 17.8 mg/L (ref 3.3–19.4)
Kappa, lambda light chain ratio: 1.26 (ref 0.26–1.65)
Lambda free light chains: 14.1 mg/L (ref 5.7–26.3)

## 2020-01-27 LAB — IGG, IGA, IGM
IgA: 234 mg/dL (ref 64–422)
IgG (Immunoglobin G), Serum: 781 mg/dL (ref 586–1602)
IgM (Immunoglobulin M), Srm: 74 mg/dL (ref 26–217)

## 2020-01-30 LAB — PROTEIN ELECTROPHORESIS, SERUM
A/G Ratio: 1.5 (ref 0.7–1.7)
Albumin ELP: 4.1 g/dL (ref 2.9–4.4)
Alpha-1-Globulin: 0.2 g/dL (ref 0.0–0.4)
Alpha-2-Globulin: 0.8 g/dL (ref 0.4–1.0)
Beta Globulin: 1.1 g/dL (ref 0.7–1.3)
Gamma Globulin: 0.8 g/dL (ref 0.4–1.8)
Globulin, Total: 2.8 g/dL (ref 2.2–3.9)
M-Spike, %: 0.4 g/dL — ABNORMAL HIGH
Total Protein ELP: 6.9 g/dL (ref 6.0–8.5)

## 2020-02-06 ENCOUNTER — Ambulatory Visit (INDEPENDENT_AMBULATORY_CARE_PROVIDER_SITE_OTHER): Payer: Medicare HMO | Admitting: Internal Medicine

## 2020-02-06 ENCOUNTER — Encounter: Payer: Self-pay | Admitting: Internal Medicine

## 2020-02-06 ENCOUNTER — Ambulatory Visit (INDEPENDENT_AMBULATORY_CARE_PROVIDER_SITE_OTHER): Payer: Medicare HMO

## 2020-02-06 ENCOUNTER — Encounter (INDEPENDENT_AMBULATORY_CARE_PROVIDER_SITE_OTHER): Payer: Self-pay

## 2020-02-06 ENCOUNTER — Other Ambulatory Visit: Payer: Self-pay

## 2020-02-06 VITALS — Ht 66.5 in | Wt 178.0 lb

## 2020-02-06 VITALS — BP 166/86 | HR 86 | Temp 96.0°F | Resp 15 | Ht 65.5 in | Wt 171.2 lb

## 2020-02-06 DIAGNOSIS — N1831 Chronic kidney disease, stage 3a: Secondary | ICD-10-CM

## 2020-02-06 DIAGNOSIS — H905 Unspecified sensorineural hearing loss: Secondary | ICD-10-CM | POA: Diagnosis not present

## 2020-02-06 DIAGNOSIS — E875 Hyperkalemia: Secondary | ICD-10-CM | POA: Diagnosis not present

## 2020-02-06 DIAGNOSIS — E785 Hyperlipidemia, unspecified: Secondary | ICD-10-CM

## 2020-02-06 DIAGNOSIS — T466X5A Adverse effect of antihyperlipidemic and antiarteriosclerotic drugs, initial encounter: Secondary | ICD-10-CM

## 2020-02-06 DIAGNOSIS — E119 Type 2 diabetes mellitus without complications: Secondary | ICD-10-CM

## 2020-02-06 DIAGNOSIS — Z0000 Encounter for general adult medical examination without abnormal findings: Secondary | ICD-10-CM

## 2020-02-06 DIAGNOSIS — M109 Gout, unspecified: Secondary | ICD-10-CM | POA: Diagnosis not present

## 2020-02-06 DIAGNOSIS — M791 Myalgia, unspecified site: Secondary | ICD-10-CM

## 2020-02-06 DIAGNOSIS — I10 Essential (primary) hypertension: Secondary | ICD-10-CM | POA: Diagnosis not present

## 2020-02-06 DIAGNOSIS — D126 Benign neoplasm of colon, unspecified: Secondary | ICD-10-CM | POA: Diagnosis not present

## 2020-02-06 NOTE — Assessment & Plan Note (Addendum)
No recurrences since using allopurinol

## 2020-02-06 NOTE — Patient Instructions (Addendum)
For your hip pain:  You can add up to 2000 mg of acetominophen (tylenol) every day safely  In divided doses (500 mg every 6 hours  Or 1000 mg every 12 hours.)  You should avoid Aleve because it can harm your kidneys   You need 60 ounces of a non caffeinated beverage daily to stay hydrated    I have referred you for a formal hearing test  You have type 2 DM.  This can affect your vision, your kidneys and your risk for heart attack If your cholesterol is elevated  We will discuss alternative therapy   Your blood pressure is too high.  Please obtain a BP monitor and check your readings at home after stopping the Aleve.  Goal is 130/80

## 2020-02-06 NOTE — Patient Instructions (Addendum)
  Haley Love , Thank you for taking time to come for your Medicare Wellness Visit. I appreciate your ongoing commitment to your health goals. Please review the following plan we discussed and let me know if I can assist you in the future.   These are the goals we discussed: Goals      Patient Stated   . Increase physical activity (pt-stated)     Walk for exercise as tolerated       This is a list of the screening recommended for you and due dates:  Health Maintenance  Topic Date Due  . Eye exam for diabetics  Never done  . Hemoglobin A1C  02/04/2019  . Complete foot exam   08/11/2019  . Pneumonia vaccines (1 of 2 - PCV13) 02/05/2021*  . Tetanus Vaccine  05/20/2027  . DEXA scan (bone density measurement)  Completed  . Flu Shot  Discontinued  *Topic was postponed. The date shown is not the original due date.

## 2020-02-06 NOTE — Assessment & Plan Note (Addendum)
Diagnosis reviewed with patient who  has been taking aleve daily .  Advised to stop using Aleve ,  Use tylenol prn

## 2020-02-06 NOTE — Assessment & Plan Note (Addendum)
Her ten year risk of CAD is > 25% due to concurrent hypertension and diabetes,  and she has aortic atherosclerosis on plain films.  Statin has been  Prescribed in the past  but not tolerated.  She is taking  red yeast rice  And repeat lipids are due for review.  Will recommend trial of Repatha if covered by insurance .   Lab Results  Component Value Date   CHOL 203 (H) 08/06/2018   HDL 38.90 (L) 08/06/2018   LDLCALC 109 (H) 02/01/2018   LDLDIRECT 139.0 08/06/2018   TRIG 214.0 (H) 08/06/2018   CHOLHDL 5 08/06/2018

## 2020-02-06 NOTE — Progress Notes (Signed)
Subjective:  Patient ID: Haley Love, female    DOB: August 14, 1934  Age: 86 y.o. MRN: [id number]  CC: The primary encounter diagnosis was Hyperlipidemia with target LDL less than 100. Diagnoses of Diabetes mellitus type 2, diet-controlled ([place]), Gouty arthritis of great toe, Sensorineural hearing loss (SNHL), unspecified laterality, Stage 3a chronic kidney disease, Tubular adenoma of colon, Myalgia due to statin, and Essential hypertension were also pertinent to this visit.  HPI Haley Love presents for follow  Up on chronic issues including hypertension, diet controlled Type 2 DM, and hyperlipidemia.  This visit occurred during the SARS-CoV-2 public health emergency.  Safety protocols were in place, including screening questions prior to the visit, additional usage of staff PPE, and extensive cleaning of exam room while observing appropriate contact time as indicated for disinfecting solutions.    T2DM:  She  feels generally well,  Is walking  regularly , and weight is stable .  She does not check her  blood sugars ,  only if she feels she may be having a hypoglycemic event, which has not occurred  Following a carbohydrate modified diet 6 days per week. Denies numbness, burning and tingling of extremities. Appetite is good.   HTN:  Patient is taking her medications as prescribed and notes no adverse effects.  Home BP readings have never been done , as she does not own a BP monitor. .  She is avoiding added salt in her diet and walking regularly about 5 times per week for exercise.  Has been using advil for management of hip pain . Foot exam done.  Has had cataract surgery, but now having trouble reading small print, and hearing loss.  a1c has not been done since 2019    Joint pain:  She has been taking Relief Factor (3 packs daily)  for lumbar /left sided buttock and hip pain) .  SI joint pain is the most troublesome, .  Lives in a 3 story house,  stair climbing difficult.   Tolerable ,  Walking daily  Averages 1 mile with brother,  Sometimes more .  No change in pain with walking.  Has seen Orthopedics and told that there was no DJD of hip per [place]. Currently pain only radiates to left buttock and is brought on by standing up  or going up stairs.     Outpatient Medications Prior to Visit  Medication Sig Dispense Refill  . allopurinol (ZYLOPRIM) 300 MG tablet Take 1 tablet (300 mg total) by mouth daily. 90 tablet 4  . aspirin 81 MG tablet Take 81 mg by mouth daily.    Marland Kitchen b complex vitamins tablet Take 1 tablet by mouth daily.    . B Complex-C (RA B-COMPLEX/VITAMIN C CR) TBCR Take by mouth.    Chong Sicilian, Borago officinalis, (BORAGE OIL PO) Take 1,000 mg by mouth daily.    . Calcium Carbonate-Vit D-Min (RA CALCIUM 600/VIT D/MINERALS) 600-200 MG-UNIT TABS Take tablets by mouth once a day.    . Cholecalciferol (VITAMIN D-3) 1000 UNITS CAPS Take 1 capsule by mouth at bedtime.    Marland Kitchen CINNAMON PO Take 2,000 mg by mouth at bedtime.    . Coenzyme Q10 (CO Q-10) 200 MG CAPS Take 1 capsule by mouth at bedtime.    . Collagenase POWD by Does not apply route daily at 6 (six) AM.    . Flaxseed, Linseed, (FLAX SEED OIL) 1000 MG CAPS Take by mouth.    . Indole-3-Carbinol POWD Take 200 mg by  mouth 2 (two) times daily.    Javier Docker Oil 1500 MG CAPS Take by mouth.    . Magnesium 500 MG CAPS Take 1 capsule by mouth daily.     . Milk Thistle 300 MG CAPS Take by mouth daily at 6 (six) AM.    . Misc Natural Products (NARCOSOFT HERBAL LAX PO) Take 2 tablets by mouth at bedtime.     . mupirocin ointment (BACTROBAN) 2 % Apply 1 application topically 2 (two) times daily. 30 g 0  . Nutritional Supplements (NUTRITIONAL SUPPLEMENT PO) Take 3 capsules by mouth 2 (two) times daily. Neprinol AFD>    . Red Yeast Rice Extract (RED YEAST RICE PO) Take 1,000 mg by mouth 2 (two) times daily.    Marland Kitchen telmisartan (MICARDIS) 20 MG tablet TAKE 1 TABLET BY MOUTH EVERYDAY AT BEDTIME 90 tablet 1  . vitamin E 400  UNIT capsule Take 400 Units by mouth daily.     No facility-administered medications prior to visit.    Review of Systems;  Patient denies headache, fevers, malaise, unintentional weight loss, skin rash, eye pain, sinus congestion and sinus pain, sore throat, dysphagia,  hemoptysis , cough, dyspnea, wheezing, chest pain, palpitations, orthopnea, edema, abdominal pain, nausea, melena, diarrhea, constipation, flank pain, dysuria, hematuria, urinary  Frequency, nocturia, numbness, tingling, seizures,  Focal weakness, Loss of consciousness,  Tremor, insomnia, depression, anxiety, and suicidal ideation.      Objective:  BP (!) 166/86 (BP Location: Left Arm, Patient Position: Sitting, Cuff Size: Normal)   Pulse 86   Temp (!) 96 F (35.6 C) (Temporal)   Resp 15   Ht 5' 5.5" (1.664 m)   Wt 171 lb 3.2 oz (77.7 kg)   SpO2 97%   BMI 28.06 kg/m   BP Readings from Last 3 Encounters:  02/06/20 (!) 166/86  12/05/19 (!) 169/71  05/26/19 (!) 184/68    Wt Readings from Last 3 Encounters:  02/06/20 171 lb 3.2 oz (77.7 kg)  02/06/20 178 lb (80.7 kg)  12/05/19 178 lb 4.8 oz (80.9 kg)    General appearance: alert, cooperative and appears stated age Ears: normal TM's and external ear canals both ears Throat: lips, mucosa, and tongue normal; teeth and gums normal Neck: no adenopathy, no carotid bruit, supple, symmetrical, trachea midline and thyroid not enlarged, symmetric, no tenderness/mass/nodules Back: symmetric, no curvature. ROM normal. No CVA tenderness. Lungs: clear to auscultation bilaterally Heart: regular rate and rhythm, S1, S2 normal, no murmur, click, rub or gallop Abdomen: soft, non-tender; bowel sounds normal; no masses,  no organomegaly Pulses: 2+ and symmetric Skin: Skin color, texture, turgor normal. No rashes or lesions Lymph nodes: Cervical, supraclavicular, and axillary nodes normal.  Lab Results  Component Value Date   HGBA1C 6.5 08/06/2018   HGBA1C 6.7 (H) 02/01/2018    HGBA1C 6.6 (H) 08/05/2017    Lab Results  Component Value Date   CREATININE 1.03 (H) 01/26/2020   CREATININE 1.10 (H) 12/05/2019   CREATININE 1.01 (H) 10/06/2019    Lab Results  Component Value Date   WBC 43.6 (H) 01/26/2020   HGB 14.5 01/26/2020   HCT 43.7 01/26/2020   PLT 223 01/26/2020   GLUCOSE 94 01/26/2020   CHOL 203 (H) 08/06/2018   TRIG 214.0 (H) 08/06/2018   HDL 38.90 (L) 08/06/2018   LDLDIRECT 139.0 08/06/2018   LDLCALC 109 (H) 02/01/2018   ALT 27 01/26/2020   AST 28 01/26/2020   NA 142 01/26/2020   K 4.6 01/26/2020   CL  108 01/26/2020   CREATININE 1.03 (H) 01/26/2020   BUN 22 01/26/2020   CO2 28 01/26/2020   TSH 1.44 02/01/2018   INR 1.0 05/31/2014   HGBA1C 6.5 08/06/2018    MM 3D SCREEN BREAST BILATERAL  Result Date: 08/05/2019 CLINICAL DATA:  Screening. EXAM: DIGITAL SCREENING BILATERAL MAMMOGRAM WITH TOMO AND CAD COMPARISON:  Previous exam(s). ACR Breast Density Category b: There are scattered areas of fibroglandular density. FINDINGS: There are no findings suspicious for malignancy. Images were processed with CAD. IMPRESSION: No mammographic evidence of malignancy. A result letter of this screening mammogram will be mailed directly to the patient. RECOMMENDATION: Screening mammogram in one year. (Code:SM-B-01Y) BI-RADS CATEGORY  1: Negative. Electronically Signed   By: Lajean Manes M.D.   On: 08/05/2019 09:40    Assessment & Plan:   Problem List Items Addressed This Visit      Unprioritized   Tubular adenoma of colon    5 yr follow up colonoscopy at the age of 3 not likely to be worth the risk but will discuss at the appropriate time       Hyperlipidemia with target LDL less than 100 - Primary    Her ten year risk of CAD is > 25% due to concurrent hypertension and diabetes,  and she has aortic atherosclerosis on plain films.  Statin has been  Prescribed in the past  but not tolerated.  She is taking  red yeast rice  And repeat lipids are due for  review.  Will recommend trial of Repatha if covered by insurance .   Lab Results  Component Value Date   CHOL 203 (H) 08/06/2018   HDL 38.90 (L) 08/06/2018   LDLCALC 109 (H) 02/01/2018   LDLDIRECT 139.0 08/06/2018   TRIG 214.0 (H) 08/06/2018   CHOLHDL 5 08/06/2018         Relevant Orders   Lipid panel   CKD (chronic kidney disease) stage 3, GFR 30-59 ml/min    Diagnosis reviewed with patient who  has been taking aleve daily .  Advised to stop using Aleve ,  Use tylenol prn       Essential hypertension    she reports compliance with medication regimen  but has an elevated reading today in office.  She is using NSAIDs daily.  Discussed goal of 130/80 for patients over 70  to preserve renal function.  She has been asked to check her  BP  at home and  submit readings for evaluation. Renal function, electrolytes and screen for proteinuria are all due for assessment       Diabetes mellitus type 2, diet-controlled ([place])    She has been lost to follow up on T2DM since Sept 2019 but has been historically Well controlled with diet alone. Continue  low glycemic index diet .   I have also recommended that patient continue walking for 30 minutes a minimum of 5 days per week.   Lab Results  Component Value Date   HGBA1C 6.5 08/06/2018   No results found for: MICROALBUR, [id number]        Relevant Orders   Hemoglobin A1c   Comprehensive metabolic panel   Microalbumin / creatinine urine ratio   Myalgia due to statin    Her ten year risk of CAD is > 25% and she has aortic atherosclerosis on plain films.  Statin advised but not tolerated,  Using red yeast rice .  Will try to obatin PA for Repatha , but she is hesitant due  to needle phobia   Lab Results  Component Value Date   CHOL 203 (H) 08/06/2018   HDL 38.90 (L) 08/06/2018   LDLCALC 109 (H) 02/01/2018   LDLDIRECT 139.0 08/06/2018   TRIG 214.0 (H) 08/06/2018   CHOLHDL 5 08/06/2018         Gouty arthritis of great toe    No  recurrences since using allopurinol        Other Visit Diagnoses    Sensorineural hearing loss (SNHL), unspecified laterality       Relevant Orders   Ambulatory referral to Audiology     A total of 40 minutes of face to face time was spent with patient more than half of which was spent in counselling and coordination of care  I am having Ludella F. Frutoso Chase maintain her aspirin, CINNAMON PO, Misc Natural Products (NARCOSOFT HERBAL LAX PO), Co Q-10, Vitamin D-3, Flax Seed Oil, vitamin E, b complex vitamins, Krill Oil, Red Yeast Rice Extract (RED YEAST RICE PO), Magnesium, (Borage, Borago officinalis, (BORAGE OIL PO)), RA B-Complex/Vitamin C CR, RA Calcium 600/Vit D/Minerals, Indole-3-Carbinol, Nutritional Supplements (NUTRITIONAL SUPPLEMENT PO), Milk Thistle, Collagenase, mupirocin ointment, allopurinol, and telmisartan.  No orders of the defined types were placed in this encounter.   There are no discontinued medications.  Follow-up: Return in about 3 months (around 05/08/2020) for follow up diabetes.   Crecencio Mc, MD

## 2020-02-06 NOTE — Progress Notes (Addendum)
Subjective:   Haley Love is a 86 y.o. female who presents for Medicare Annual (Subsequent) preventive examination.  Review of Systems:  No ROS.  Medicare Wellness Virtual Visit.  Visual/audio telehealth visit, UTA vital signs.   Ht/Wt provided. See social history for additional risk factors.   Cardiac Risk Factors include: advanced age (>8men, >66 women);diabetes mellitus;hypertension     Objective:     Vitals: Ht 5' 6.5" (1.689 m)   Wt 178 lb (80.7 kg)   BMI 28.30 kg/m   Body mass index is 28.3 kg/m.  Advanced Directives 02/06/2020 02/02/2019 01/29/2018 01/28/2017 07/31/2015 02/19/2015 08/06/2012  Does Patient Have a Medical Advance Directive? Yes Yes Yes Yes Yes Yes -  Type of Paramedic of [place];Living will [place];Living will [place];Living will [place];Living will - - -  Does patient want to make changes to medical advance directive? No - Patient declined No - Patient declined No - Patient declined No - Patient declined - - -  Copy of [place] in Chart? No - copy requested No - copy requested No - copy requested No - copy requested - - -  Pre-existing out of facility DNR order (yellow form or pink MOST form) - - - - - - No    Tobacco Social History   Tobacco Use  Smoking Status Never Smoker  Smokeless Tobacco Never Used     Counseling given: Not Answered   Clinical Intake:  Pre-visit preparation completed: Yes        Diabetes: Yes(Followed by pcp)  How often do you need to have someone help you when you read instructions, pamphlets, or other written materials from your doctor or pharmacy?: 1 - Never  Interpreter Needed?: No     Past Medical History:  Diagnosis Date  . Breast cancer ([place])   . Cataract   . Chickenpox   . Contact lens/glasses fitting   . Diabetes mellitus without complication ([place])   . Hyperlipidemia   . Hypertension   .  Inflammatory polyps of colon [facility])    Past Surgical History:  Procedure Laterality Date  . BREAST LUMPECTOMY Left 07/28/2012  . BREAST SURGERY  9/13   lt lump  . cataracts  2014  . COLONOSCOPY WITH PROPOFOL N/A 01/05/2017   Procedure: COLONOSCOPY WITH PROPOFOL;  Surgeon: Manya Silvas, MD;  Location[facility] ENDOSCOPY;  Service: Endoscopy;  Laterality: N/A;  . JOINT REPLACEMENT Right July 2015   [place]en, right knee  . KNEE ARTHROSCOPY W/ MENISCAL REPAIR  09/23/11   Right  . MASTECTOMY, PARTIAL  08/06/2012   Procedure: MASTECTOMY PARTIAL;  Surgeon: Adin Hector, MD;  Location: [facility];  Service: General;  Laterality: Left;  Left partial mastectomy with re-excision of margins   Family History  Problem Relation Age of Onset  . Lung cancer Mother   . Arthritis Mother   . Hyperlipidemia Mother   . Hypertension Mother   . Throat cancer Father   . Hyperlipidemia Father   . Cancer Paternal Aunt        breast  . Dementia Brother   . Leukemia Brother        lymphatic /CLL   Social History   Socioeconomic History  . Marital status: Divorced    Spouse name: Not on file  . Number of children: Not on file  . Years of education: Not on file  . Highest education level: Not on  file  Occupational History  . Not on file  Tobacco Use  . Smoking status: Never Smoker  . Smokeless tobacco: Never Used  Substance and Sexual Activity  . Alcohol use: Yes    Comment: 1 glass wine week  . Drug use: No  . Sexual activity: Never    Birth control/protection: Post-menopausal  Other Topics Concern  . Not on file  Social History Narrative   Lives alone ,     Social Determinants of Health   Financial Resource Strain:   . Difficulty of Paying Living Expenses:   Food Insecurity:   . Worried About Charity fundraiser in the Last Year:   . Arboriculturist in the Last Year:   Transportation Needs:   . Film/video editor (Medical):   Marland Kitchen Lack of Transportation  (Non-Medical):   Physical Activity:   . Days of Exercise per Week:   . Minutes of Exercise per Session:   Stress:   . Feeling of Stress :   Social Connections:   . Frequency of Communication with Friends and Family:   . Frequency of Social Gatherings with Friends and Family:   . Attends Religious Services:   . Active Member of Clubs or Organizations:   . Attends Archivist Meetings:   Marland Kitchen Marital Status:     Outpatient Encounter Medications as of 02/06/2020  Medication Sig  . allopurinol (ZYLOPRIM) 300 MG tablet Take 1 tablet (300 mg total) by mouth daily.  Marland Kitchen aspirin 81 MG tablet Take 81 mg by mouth daily.  Marland Kitchen b complex vitamins tablet Take 1 tablet by mouth daily.  . B Complex-C (RA B-COMPLEX/VITAMIN C CR) TBCR Take by mouth.  Chong Sicilian, Borago officinalis, (BORAGE OIL PO) Take 1,000 mg by mouth daily.  . Calcium Carbonate-Vit D-Min (RA CALCIUM 600/VIT D/MINERALS) 600-200 MG-UNIT TABS Take tablets by mouth once a day.  . Cholecalciferol (VITAMIN D-3) 1000 UNITS CAPS Take 1 capsule by mouth at bedtime.  Marland Kitchen CINNAMON PO Take 2,000 mg by mouth at bedtime.  . Coenzyme Q10 (CO Q-10) 200 MG CAPS Take 1 capsule by mouth at bedtime.  . Collagenase POWD by Does not apply route daily at 6 (six) AM.  . Flaxseed, Linseed, (FLAX SEED OIL) 1000 MG CAPS Take by mouth.  . Indole-3-Carbinol POWD Take 200 mg by mouth 2 (two) times daily.  Javier Docker Oil 1500 MG CAPS Take by mouth.  . Magnesium 500 MG CAPS Take 1 capsule by mouth daily.   . Milk Thistle 300 MG CAPS Take by mouth daily at 6 (six) AM.  . Misc Natural Products (NARCOSOFT HERBAL LAX PO) Take 2 tablets by mouth at bedtime.   . mupirocin ointment (BACTROBAN) 2 % Apply 1 application topically 2 (two) times daily.  . Nutritional Supplements (NUTRITIONAL SUPPLEMENT PO) Take 3 capsules by mouth 2 (two) times daily. Neprinol AFD>  . Red Yeast Rice Extract (RED YEAST RICE PO) Take 1,000 mg by mouth 2 (two) times daily.  Marland Kitchen telmisartan  (MICARDIS) 20 MG tablet TAKE 1 TABLET BY MOUTH EVERYDAY AT BEDTIME  . vitamin E 400 UNIT capsule Take 400 Units by mouth daily.  . [DISCONTINUED] COLCRYS 0.6 MG tablet TAKE 1 TABLET (0.6 MG TOTAL) BY MOUTH 2 (TWO) TIMES DAILY.  . [DISCONTINUED] doxycycline (VIBRA-TABS) 100 MG tablet Take 1 tablet (100 mg total) by mouth 2 (two) times daily.  . [DISCONTINUED] methylPREDNISolone (MEDROL DOSEPAK) 4 MG TBPK tablet Take according to pack instructions.   No  facility-administered encounter medications on file as of 02/06/2020.    Activities of Daily Living In your present state of health, do you have any difficulty performing the following activities: 02/06/2020  Hearing? N  Vision? N  Difficulty concentrating or making decisions? N  Walking or climbing stairs? Y  Comment Chronic back pain. Paces self.  Dressing or bathing? N  Doing errands, shopping? N  Preparing Food and eating ? N  Using the Toilet? N  In the past six months, have you accidently leaked urine? N  Do you have problems with loss of bowel control? N  Managing your Medications? N  Managing your Finances? N  Housekeeping or managing your Housekeeping? N  Some recent data might be hidden    Patient Care Team: Crecencio Mc, MD as PCP - General (Internal Medicine) Magrinat, Virgie Dad, MD as Consulting Physician (Oncology) Marry Guan Laurice Record, MD (Orthopedic Surgery)    Assessment:   This is a routine wellness examination for Haley Love.  Nurse connected with patient 02/06/20 at 11:00 AM EDT by a telephone enabled telemedicine application and verified that I am speaking with the correct person using two identifiers. Patient stated full name and DOB. Patient gave permission to continue with virtual visit. Patient's location was at home and Nurse's location was at [place] office.   Patient is alert and oriented x3. Patient denies difficulty focusing or concentrating. Admits difficulty remembering names and addresses.  Patient likes to  read, cook, and socialize with friends for brain stimulation.   Health Maintenance Due: -PNA vaccine- postponed per patient preference.  -Eye Exam- plans to schedule. -Foot Exam- denies changes. Followed by pcp. -Hgb A1c- future labs ordered 02/05/2019 See completed HM at the end of note.   Eye: Visual acuity not assessed. Virtual visit. Cataracts extracted. No retinopathy reported.   Dental: Visits every 6 months.    Hearing: Demonstrates normal hearing during visit.  Safety:  Patient feels safe at home- yes Patient does have smoke detectors at home- yes Patient does wear sunscreen or protective clothing when in direct sunlight - yes Patient does wear seat belt when in a moving vehicle - yes Patient drives- yes Adequate lighting in walkways free from debris- yes Grab bars and handrails used as appropriate- yes Ambulates with an assistive device- no Cell phone on person when ambulating outside of the home- yes  Social: Alcohol intake - yes      Smoking history- never   Smokers in home? none Illicit drug use? none  Medication: Taking OTC Relief Factor TID for chronic back pain Pill box in use -yes  Self managed - yes   Covid-19: Precautions and sickness symptoms discussed. Wears mask, social distancing, hand hygiene as appropriate.   Activities of Daily Living Patient denies needing assistance with: household chores, feeding themselves, getting from bed to chair, getting to the toilet, bathing/showering, dressing, managing money, or preparing meals.   Discussed the importance of a healthy diet, water intake and the benefits of aerobic exercise.   Physical activity- wallking  Diet:  Healthy Water: 4 cups daily Caffeine: none  Other Providers Patient Care Team: Crecencio Mc, MD as PCP - General (Internal Medicine) Magrinat, Virgie Dad, MD as Consulting Physician (Oncology) Marry Guan, Laurice Record, MD (Orthopedic Surgery)  Exercise Activities and Dietary  recommendations Current Exercise Habits: Home exercise routine, Type of exercise: walking, Time (Minutes): 30, Frequency (Times/Week): 3, Weekly Exercise (Minutes/Week): 90, Intensity: Mild  Goals      Patient Stated   .  Increase physical activity (pt-stated)     Walk for exercise as tolerated       Fall Risk Fall Risk  02/06/2020 06/30/2019 02/02/2019 08/13/2018 01/29/2018  Falls in the past year? 0 0 0 No No  Number falls in past yr: - 0 - - -  Follow up Falls evaluation completed - - - -   Timed Get Up and Go performed: no, virtual visit  Depression Screen PHQ 2/9 Scores 02/06/2020 06/30/2019 02/02/2019 08/13/2018  PHQ - 2 Score 0 0 0 0     Cognitive Function MMSE - Mini Mental State Exam 01/28/2017  Orientation to time 5  Orientation to Place 5  Registration 3  Attention/ Calculation 5  Recall 3  Language- name 2 objects 2  Language- repeat 1  Language- follow 3 step command 3  Language- read & follow direction 1  Write a sentence 1  Copy design 1  Total score 30     6CIT Screen 02/06/2020 02/02/2019 01/29/2018  What Year? 0 points 0 points 0 points  What month? 0 points 0 points 0 points  What time? 0 points 0 points 0 points  Count back from 20 2 points 0 points 0 points  Months in reverse 0 points 0 points 0 points  Repeat phrase 2 points 0 points 0 points  Total Score 4 0 0    Immunization History  Administered Date(s) Administered  . Tdap 05/19/2017  . Zoster 12/22/2011   Screening Tests Health Maintenance  Topic Date Due  . OPHTHALMOLOGY EXAM  Never done  . HEMOGLOBIN A1C  02/04/2019  . FOOT EXAM  08/11/2019  . PNA vac Low Risk Adult (1 of 2 - PCV13) 02/05/2021 (Originally 11/21/2001)  . TETANUS/TDAP  05/20/2027  . DEXA SCAN  Completed  . INFLUENZA VACCINE  Discontinued      Plan:   Keep all routine maintenance appointments.   Follow up with your doctor today at 2:00 in office.   Medicare Attestation I have personally reviewed: The patient's medical  and social history Their use of alcohol, tobacco or illicit drugs Their current medications and supplements The patient's functional ability including ADLs,fall risks, home safety risks, cognitive, and hearing and visual impairment Diet and physical activities Evidence for depression   I have reviewed and discussed with patient certain preventive protocols, quality metrics, and best practice recommendations.     OBrien-Blaney, Zoila Ditullio L, LPN  06/26/2121    I have reviewed the above information and agree with above.   Deborra Medina, MD

## 2020-02-07 ENCOUNTER — Encounter: Payer: Self-pay | Admitting: Internal Medicine

## 2020-02-07 LAB — COMPREHENSIVE METABOLIC PANEL
ALT: 28 U/L (ref 0–35)
AST: 33 U/L (ref 0–37)
Albumin: 4.3 g/dL (ref 3.5–5.2)
Alkaline Phosphatase: 67 U/L (ref 39–117)
BUN: 24 mg/dL — ABNORMAL HIGH (ref 6–23)
CO2: 26 mEq/L (ref 19–32)
Calcium: 10.3 mg/dL (ref 8.4–10.5)
Chloride: 104 mEq/L (ref 96–112)
Creatinine, Ser: 1.01 mg/dL (ref 0.40–1.20)
GFR: 52.32 mL/min — ABNORMAL LOW (ref >60.00)
Glucose, Bld: 91 mg/dL (ref 70–99)
Potassium: 5.2 mEq/L — ABNORMAL HIGH (ref 3.5–5.1)
Sodium: 136 mEq/L (ref 135–145)
Total Bilirubin: 0.4 mg/dL (ref 0.2–1.2)
Total Protein: 7.2 g/dL (ref 6.0–8.3)

## 2020-02-07 LAB — MICROALBUMIN / CREATININE URINE RATIO
Creatinine,U: 85.5 mg/dL
Microalb Creat Ratio: 3 mg/g (ref 0.0–30.0)
Microalb, Ur: 2.5 mg/dL — ABNORMAL HIGH (ref 0.0–1.9)

## 2020-02-07 LAB — LIPID PANEL
Cholesterol: 193 mg/dL (ref 0–200)
HDL: 36.3 mg/dL — ABNORMAL LOW (ref >39.00)
NonHDL: 156.43
Total CHOL/HDL Ratio: 5
Triglycerides: 242 mg/dL — ABNORMAL HIGH (ref 0.0–149.0)
VLDL: 48.4 mg/dL — ABNORMAL HIGH (ref 0.0–40.0)

## 2020-02-07 LAB — HEMOGLOBIN A1C: Hgb A1c MFr Bld: 6.1 % (ref 4.6–6.5)

## 2020-02-07 LAB — LDL CHOLESTEROL, DIRECT: Direct LDL: 122 mg/dL

## 2020-02-07 NOTE — Assessment & Plan Note (Signed)
Her ten year risk of CAD is > 25% and she has aortic atherosclerosis on plain films.  Statin advised but not tolerated,  Using red yeast rice .  Will try to obatin PA for Repatha , but she is hesitant due to needle phobia   Lab Results  Component Value Date   CHOL 203 (H) 08/06/2018   HDL 38.90 (L) 08/06/2018   LDLCALC 109 (H) 02/01/2018   LDLDIRECT 139.0 08/06/2018   TRIG 214.0 (H) 08/06/2018   CHOLHDL 5 08/06/2018

## 2020-02-07 NOTE — Assessment & Plan Note (Signed)
5 yr follow up colonoscopy at the age of 25 not likely to be worth the risk but will discuss at the appropriate time

## 2020-02-07 NOTE — Assessment & Plan Note (Signed)
She has been lost to follow up on T2DM since Sept 2019 but has been historically Well controlled with diet alone. Continue  low glycemic index diet .   I have also recommended that patient continue walking for 30 minutes a minimum of 5 days per week.   Lab Results  Component Value Date   HGBA1C 6.5 08/06/2018   No results found for: Derl Barrow

## 2020-02-07 NOTE — Assessment & Plan Note (Signed)
she reports compliance with medication regimen  but has an elevated reading today in office.  She is using NSAIDs daily.  Discussed goal of 130/80 for patients over 70  to preserve renal function.  She has been asked to check her  BP  at home and  submit readings for evaluation. Renal function, electrolytes and screen for proteinuria are all due for assessment

## 2020-02-09 DIAGNOSIS — C44319 Basal cell carcinoma of skin of other parts of face: Secondary | ICD-10-CM | POA: Diagnosis not present

## 2020-02-11 NOTE — Addendum Note (Signed)
Addended by: Crecencio Mc on: 02/11/2020 01:51 PM   Modules accepted: Orders

## 2020-02-16 DIAGNOSIS — L570 Actinic keratosis: Secondary | ICD-10-CM | POA: Diagnosis not present

## 2020-02-16 DIAGNOSIS — L821 Other seborrheic keratosis: Secondary | ICD-10-CM | POA: Diagnosis not present

## 2020-02-16 DIAGNOSIS — C44612 Basal cell carcinoma of skin of right upper limb, including shoulder: Secondary | ICD-10-CM | POA: Diagnosis not present

## 2020-02-16 DIAGNOSIS — D485 Neoplasm of uncertain behavior of skin: Secondary | ICD-10-CM | POA: Diagnosis not present

## 2020-02-16 DIAGNOSIS — C44629 Squamous cell carcinoma of skin of left upper limb, including shoulder: Secondary | ICD-10-CM | POA: Diagnosis not present

## 2020-02-16 DIAGNOSIS — L986 Other infiltrative disorders of the skin and subcutaneous tissue: Secondary | ICD-10-CM | POA: Diagnosis not present

## 2020-02-17 ENCOUNTER — Telehealth: Payer: Self-pay | Admitting: Internal Medicine

## 2020-02-17 NOTE — Telephone Encounter (Signed)
Pt was returning your call about labs

## 2020-02-20 NOTE — Telephone Encounter (Signed)
Labs printed by brock for patient

## 2020-02-21 ENCOUNTER — Telehealth: Payer: Self-pay

## 2020-02-21 NOTE — Telephone Encounter (Signed)
-----   Message from Crecencio Mc, MD sent at 02/21/2020  8:22 AM EDT ----- Please confirm that she understands she needs to return for a repeat potassium check after stopping the potassium supplement (one week after stopping)

## 2020-02-21 NOTE — Telephone Encounter (Signed)
Left message for patient to return call back for lab results and to schedule Lab appointment after 1 week stopping potassium supp.Marland Kitchen

## 2020-02-28 ENCOUNTER — Other Ambulatory Visit (INDEPENDENT_AMBULATORY_CARE_PROVIDER_SITE_OTHER): Payer: Medicare HMO

## 2020-02-28 ENCOUNTER — Other Ambulatory Visit: Payer: Self-pay

## 2020-02-28 DIAGNOSIS — E875 Hyperkalemia: Secondary | ICD-10-CM

## 2020-02-28 LAB — BASIC METABOLIC PANEL
BUN: 23 mg/dL (ref 6–23)
CO2: 29 mEq/L (ref 19–32)
Calcium: 9.6 mg/dL (ref 8.4–10.5)
Chloride: 106 mEq/L (ref 96–112)
Creatinine, Ser: 1.07 mg/dL (ref 0.40–1.20)
GFR: 48.94 mL/min — ABNORMAL LOW (ref >60.00)
Glucose, Bld: 127 mg/dL — ABNORMAL HIGH (ref 70–99)
Potassium: 4.8 mEq/L (ref 3.5–5.1)
Sodium: 141 mEq/L (ref 135–145)

## 2020-03-06 DIAGNOSIS — X32XXXA Exposure to sunlight, initial encounter: Secondary | ICD-10-CM | POA: Diagnosis not present

## 2020-03-06 DIAGNOSIS — L570 Actinic keratosis: Secondary | ICD-10-CM | POA: Diagnosis not present

## 2020-03-13 DIAGNOSIS — H35033 Hypertensive retinopathy, bilateral: Secondary | ICD-10-CM | POA: Diagnosis not present

## 2020-03-13 DIAGNOSIS — E113293 Type 2 diabetes mellitus with mild nonproliferative diabetic retinopathy without macular edema, bilateral: Secondary | ICD-10-CM | POA: Diagnosis not present

## 2020-03-13 DIAGNOSIS — H26493 Other secondary cataract, bilateral: Secondary | ICD-10-CM | POA: Diagnosis not present

## 2020-03-20 ENCOUNTER — Telehealth: Payer: Self-pay | Admitting: Internal Medicine

## 2020-03-20 DIAGNOSIS — H3500 Unspecified background retinopathy: Secondary | ICD-10-CM

## 2020-03-20 NOTE — Telephone Encounter (Signed)
Pt states that she went to Dr. Ellin Mayhew to get an eye exam on the 20th of April. He told her that she had signs of diabetes and high blood pressure. Her last BP reading was this morning 170/70. He said he would fax over info from visit. Pt is just following up. Please advise.

## 2020-03-20 NOTE — Telephone Encounter (Signed)
If she would like to have labs done prior to visit,  I have ordered them.  No fasting required

## 2020-03-20 NOTE — Telephone Encounter (Signed)
Spoke with pt and she has been scheduled for a follow up on Friday the 30th at 11:30am. Pt is aware of appt date and time.

## 2020-03-22 ENCOUNTER — Inpatient Hospital Stay: Payer: Medicare HMO | Attending: Oncology

## 2020-03-22 ENCOUNTER — Other Ambulatory Visit: Payer: Self-pay

## 2020-03-22 DIAGNOSIS — Z170 Estrogen receptor positive status [ER+]: Secondary | ICD-10-CM | POA: Diagnosis not present

## 2020-03-22 DIAGNOSIS — C91Z0 Other lymphoid leukemia not having achieved remission: Secondary | ICD-10-CM

## 2020-03-22 DIAGNOSIS — C50512 Malignant neoplasm of lower-outer quadrant of left female breast: Secondary | ICD-10-CM | POA: Insufficient documentation

## 2020-03-22 DIAGNOSIS — C9110 Chronic lymphocytic leukemia of B-cell type not having achieved remission: Secondary | ICD-10-CM | POA: Insufficient documentation

## 2020-03-22 DIAGNOSIS — D472 Monoclonal gammopathy: Secondary | ICD-10-CM | POA: Insufficient documentation

## 2020-03-22 LAB — CBC WITH DIFFERENTIAL/PLATELET
Abs Immature Granulocytes: 0 10*3/uL (ref 0.00–0.07)
Basophils Absolute: 0.5 10*3/uL — ABNORMAL HIGH (ref 0.0–0.1)
Basophils Relative: 1 %
Eosinophils Absolute: 0.5 10*3/uL (ref 0.0–0.5)
Eosinophils Relative: 1 %
HCT: 41.8 % (ref 36.0–46.0)
Hemoglobin: 13.8 g/dL (ref 12.0–15.0)
Lymphocytes Relative: 81 %
Lymphs Abs: 39 10*3/uL — ABNORMAL HIGH (ref 0.7–4.0)
MCH: 30.6 pg (ref 26.0–34.0)
MCHC: 33 g/dL (ref 30.0–36.0)
MCV: 92.7 fL (ref 80.0–100.0)
Monocytes Absolute: 0.5 10*3/uL (ref 0.1–1.0)
Monocytes Relative: 1 %
Neutro Abs: 7.7 10*3/uL (ref 1.7–7.7)
Neutrophils Relative %: 16 %
Platelets: 214 10*3/uL (ref 150–400)
RBC: 4.51 MIL/uL (ref 3.87–5.11)
RDW: 13.2 % (ref 11.5–15.5)
WBC: 48.2 10*3/uL — ABNORMAL HIGH (ref 4.0–10.5)
nRBC: 0 % (ref 0.0–0.2)

## 2020-03-22 LAB — LACTATE DEHYDROGENASE: LDH: 303 U/L — ABNORMAL HIGH (ref 98–192)

## 2020-03-22 LAB — COMPREHENSIVE METABOLIC PANEL
ALT: 23 U/L (ref 0–44)
AST: 26 U/L (ref 15–41)
Albumin: 3.9 g/dL (ref 3.5–5.0)
Alkaline Phosphatase: 85 U/L (ref 38–126)
Anion gap: 10 (ref 5–15)
BUN: 19 mg/dL (ref 8–23)
CO2: 26 mmol/L (ref 22–32)
Calcium: 9.9 mg/dL (ref 8.9–10.3)
Chloride: 106 mmol/L (ref 98–111)
Creatinine, Ser: 1.06 mg/dL — ABNORMAL HIGH (ref 0.44–1.00)
GFR calc Af Amer: 56 mL/min — ABNORMAL LOW (ref >60)
GFR calc non Af Amer: 49 mL/min — ABNORMAL LOW (ref >60)
Glucose, Bld: 96 mg/dL (ref 70–99)
Potassium: 4.4 mmol/L (ref 3.5–5.1)
Sodium: 142 mmol/L (ref 135–145)
Total Bilirubin: 0.4 mg/dL (ref 0.3–1.2)
Total Protein: 7.4 g/dL (ref 6.5–8.1)

## 2020-03-22 NOTE — Telephone Encounter (Signed)
LMTCB

## 2020-03-23 ENCOUNTER — Encounter: Payer: Self-pay | Admitting: Internal Medicine

## 2020-03-23 ENCOUNTER — Ambulatory Visit
Admission: EM | Admit: 2020-03-23 | Discharge: 2020-03-23 | Disposition: A | Discharge status: Home / Self Care | Payer: Medicare HMO | Attending: Family Medicine | Admitting: Family Medicine

## 2020-03-23 ENCOUNTER — Encounter: Payer: Self-pay | Admitting: Emergency Medicine

## 2020-03-23 ENCOUNTER — Other Ambulatory Visit: Payer: Self-pay

## 2020-03-23 ENCOUNTER — Ambulatory Visit (INDEPENDENT_AMBULATORY_CARE_PROVIDER_SITE_OTHER): Payer: Medicare HMO | Admitting: Internal Medicine

## 2020-03-23 DIAGNOSIS — R067 Sneezing: Secondary | ICD-10-CM | POA: Diagnosis not present

## 2020-03-23 DIAGNOSIS — J3089 Other allergic rhinitis: Secondary | ICD-10-CM | POA: Diagnosis not present

## 2020-03-23 DIAGNOSIS — I10 Essential (primary) hypertension: Secondary | ICD-10-CM | POA: Diagnosis not present

## 2020-03-23 DIAGNOSIS — J069 Acute upper respiratory infection, unspecified: Secondary | ICD-10-CM

## 2020-03-23 DIAGNOSIS — R0982 Postnasal drip: Secondary | ICD-10-CM | POA: Diagnosis not present

## 2020-03-23 DIAGNOSIS — E119 Type 2 diabetes mellitus without complications: Secondary | ICD-10-CM

## 2020-03-23 DIAGNOSIS — Z0189 Encounter for other specified special examinations: Secondary | ICD-10-CM | POA: Diagnosis not present

## 2020-03-23 DIAGNOSIS — Z1152 Encounter for screening for COVID-19: Secondary | ICD-10-CM

## 2020-03-23 LAB — PROTEIN ELECTROPHORESIS, SERUM
A/G Ratio: 1.2 (ref 0.7–1.7)
Albumin ELP: 3.7 g/dL (ref 2.9–4.4)
Alpha-1-Globulin: 0.2 g/dL (ref 0.0–0.4)
Alpha-2-Globulin: 0.9 g/dL (ref 0.4–1.0)
Beta Globulin: 1.2 g/dL (ref 0.7–1.3)
Gamma Globulin: 0.8 g/dL (ref 0.4–1.8)
Globulin, Total: 3.1 g/dL (ref 2.2–3.9)
M-Spike, %: 0.3 g/dL — ABNORMAL HIGH
Total Protein ELP: 6.8 g/dL (ref 6.0–8.5)

## 2020-03-23 LAB — KAPPA/LAMBDA LIGHT CHAINS
Kappa free light chain: 19.9 mg/L — ABNORMAL HIGH (ref 3.3–19.4)
Kappa, lambda light chain ratio: 1.35 (ref 0.26–1.65)
Lambda free light chains: 14.7 mg/L (ref 5.7–26.3)

## 2020-03-23 LAB — IGG, IGA, IGM
IgA: 240 mg/dL (ref 64–422)
IgG (Immunoglobin G), Serum: 822 mg/dL (ref 586–1602)
IgM (Immunoglobulin M), Srm: 74 mg/dL (ref 26–217)

## 2020-03-23 MED ORDER — TELMISARTAN 40 MG PO TABS: 40.0000 mg | ORAL_TABLET | Freq: Every day | ORAL | 1 refills | Status: DC

## 2020-03-23 NOTE — ED Provider Notes (Signed)
Roderic Palau    CSN: [id number] Arrival date & time: 03/23/20  1209      History   Chief Complaint Chief Complaint  Patient presents with  . Sinus Problem    HPI Haley Love is a 86 y.o. female.   Reports that she has been struggling with her allergies for the last week.  Reports that she is going on vacation next week with her kids and wants to be sure that she does not have Covid.  Requesting Covid testing today.  Reports postnasal drip, with sneezing.  Has not attempted to treat at home.  Was seen at primary care earlier today for possible prediabetes and for hypertension management.  She was told that they would not test her for Covid there.  Denies headache, cough, shortness of breath, nausea, vomiting, diarrhea, rash, fever, other symptoms.  ROS per HPI  The history is provided by the patient.    Past Medical History:  Diagnosis Date  . Breast cancer ([place])   . Cataract   . Chickenpox   . Contact lens/glasses fitting   . Diabetes mellitus without complication ([place])   . Hyperlipidemia   . Hypertension   . Inflammatory polyps of colon [facility])     Patient Active Problem List   Diagnosis Date Noted  . Upper respiratory infection 03/23/2020  . Gouty arthritis of great toe 05/13/2019  . 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 Myalgia due to statin 08/09/2017  . Goals of care, counseling/discussion 08/06/2017  . Diabetes mellitus type 2, diet-controlled ([place]) 03/01/2016  . Essential hypertension 02/27/2016  . MGUS (monoclonal gammopathy of unknown significance) 08/19/2014  . CKD (chronic kidney disease) stage 3, GFR 30-59 ml/min 08/13/2014  . Malignant neoplasm of lower-outer quadrant of left breast of female, estrogen receptor positive ([place]) 10/31/2013  . S/P TKR (total knee replacement) 08/04/2013  . Encounter for Medicare annual  wellness exam 08/04/2013  . History of scabies 08/03/2013  . Hyperlipidemia with target LDL less than 100 12/22/2012  . Tubular adenoma of colon 12/21/2012  . Dermatitis 12/14/2012    Past Surgical History:  Procedure Laterality Date  . BREAST LUMPECTOMY Left 07/28/2012  . BREAST SURGERY  9/13   lt lump  . cataracts  2014  . COLONOSCOPY WITH PROPOFOL N/A 01/05/2017   Procedure: COLONOSCOPY WITH PROPOFOL;  Surgeon: Manya Silvas, MD;  [facility] ENDOSCOPY;  Service: Endoscopy;  Laterality: N/A;  . JOINT REPLACEMENT Right July 2015   [place], right knee  . KNEE ARTHROSCOPY W/ MENISCAL REPAIR  09/23/11   Right  . MASTECTOMY, PARTIAL  08/06/2012   Procedure: MASTECTOMY PARTIAL;  Surgeon: Adin Hector, MD;  Location: [place];  Service: General;  Laterality: Left;  Left partial mastectomy with re-excision of margins    OB History   No obstetric history on file.      Home Medications    Prior to Admission medications   Medication Sig Start Date End Date Taking? Authorizing Provider  allopurinol (ZYLOPRIM) 300 MG tablet Take 1 tablet (300 mg total) by mouth daily. 05/16/19  Yes Magrinat, Virgie Dad, MD  aspirin 81 MG tablet Take 81 mg by mouth daily.   Yes [provider]  b complex vitamins tablet Take 1 tablet by mouth daily.   Yes [provider]  B Complex-C (RA B-COMPLEX/VITAMIN C CR) TBCR Take by mouth.   Yes [provider]  Borage, Borago officinalis, (BORAGE OIL PO) Take 1,000 mg by mouth daily.   Yes [provider]  Calcium Carbonate-Vit D-Min (RA CALCIUM 600/VIT D/MINERALS) 600-200 MG-UNIT TABS Take tablets by mouth once a day.   Yes [provider]  Cholecalciferol (VITAMIN D-3) 1000 UNITS CAPS Take 1 capsule by mouth at bedtime.   Yes [provider]  CINNAMON PO Take 2,000 mg by mouth at bedtime.   Yes [provider]  Coenzyme Q10 (CO Q-10) 200 MG CAPS Take 1 capsule by mouth at  bedtime.   Yes [provider]  Collagenase POWD by Does not apply route daily at 6 (six) AM.   Yes [provider]  Flaxseed, Linseed, (FLAX SEED OIL) 1000 MG CAPS Take by mouth.   Yes [provider]  Indole-3-Carbinol POWD Take 200 mg by mouth 2 (two) times daily.   Yes [provider]  Javier Docker Oil 1500 MG CAPS Take by mouth.   Yes [provider]  Magnesium 500 MG CAPS Take 1 capsule by mouth daily.    Yes [provider]  Milk Thistle 300 MG CAPS Take by mouth daily at 6 (six) AM.   Yes [provider]  Misc Natural Products (NARCOSOFT HERBAL LAX PO) Take 2 tablets by mouth at bedtime.    Yes [provider]  Nutritional Supplements (NUTRITIONAL SUPPLEMENT PO) Take 3 capsules by mouth 2 (two) times daily. Neprinol AFD>   Yes [provider]  Red Yeast Rice Extract (RED YEAST RICE PO) Take 1,000 mg by mouth 2 (two) times daily.   Yes [provider]  telmisartan (MICARDIS) 40 MG tablet Take 1 tablet (40 mg total) by mouth daily. 03/23/20  Yes Crecencio Mc, MD  vitamin E 400 UNIT capsule Take 400 Units by mouth daily.   Yes [provider]  mupirocin ointment (BACTROBAN) 2 % Apply 1 application topically 2 (two) times daily. 08/13/18   McLean-Scocuzza, Nino Glow, MD    Family History Family History  Problem Relation Age of Onset  . Lung cancer Mother   . Arthritis Mother   . Hyperlipidemia Mother   . Hypertension Mother   . Throat cancer Father   . Hyperlipidemia Father   . Cancer Paternal Aunt        breast  . Dementia Brother   . Leukemia Brother        lymphatic /CLL    Social History Social History   Tobacco Use  . Smoking status: Never Smoker  . Smokeless tobacco: Never Used  Substance Use Topics  . Alcohol use: Yes    Comment: 1 glass wine week  . Drug use: No     Allergies   No known allergies   Review of Systems Review of Systems   Physical Exam Triage Vital  Signs ED Triage Vitals  Enc Vitals Group     BP 03/23/20 1216 (!) 175/85     Pulse Rate 03/23/20 1216 92     Resp 03/23/20 1216 18     Temp 03/23/20 1216 98.2 F (36.8 C)     Temp Source 03/23/20 1216 Oral     SpO2 03/23/20 1216 96 %     Weight 03/23/20 1213 169 lb 12.1 oz (77 kg)     Height 03/23/20 1213 5' 5.5" (1.664 m)     Head Circumference --      Peak Flow --      Pain Score 03/23/20 1211 0  Pain Loc --      Pain Edu? --      Excl. in [place]? --    No data found.  Updated Vital Signs BP (!) 175/85 (BP Location: Left Arm)   Pulse 92   Temp 98.2 F (36.8 C) (Oral)   Resp 18   Ht 5' 5.5" (1.664 m)   Wt 169 lb 12.1 oz (77 kg)   SpO2 96%   BMI 27.82 kg/m   Visual Acuity Right Eye Distance:   Left Eye Distance:   Bilateral Distance:    Right Eye Near:   Left Eye Near:    Bilateral Near:     Physical Exam Vitals and nursing note reviewed.  Constitutional:      General: She is not in acute distress.    Appearance: Normal appearance. She is well-developed and normal weight. She is not ill-appearing.  HENT:     Head: Normocephalic and atraumatic.     Right Ear: Tympanic membrane normal.     Left Ear: Tympanic membrane normal.     Nose: Congestion and rhinorrhea present.     Mouth/Throat:     Mouth: Mucous membranes are moist.     Pharynx: Oropharynx is clear.  Eyes:     Extraocular Movements: Extraocular movements intact.     Conjunctiva/sclera: Conjunctivae normal.     Pupils: Pupils are equal, round, and reactive to light.  Cardiovascular:     Rate and Rhythm: Normal rate and regular rhythm.     Pulses: Normal pulses.     Heart sounds: Normal heart sounds. No murmur.  Pulmonary:     Effort: Pulmonary effort is normal. No respiratory distress.     Breath sounds: Normal breath sounds. No stridor. No wheezing, rhonchi or rales.  Chest:     Chest wall: No tenderness.  Abdominal:     General: Bowel sounds are normal.     Palpations: Abdomen is soft.      Tenderness: There is no abdominal tenderness.  Musculoskeletal:        General: Normal range of motion.     Cervical back: Normal range of motion and neck supple.  Skin:    General: Skin is warm and dry.     Capillary Refill: Capillary refill takes less than 2 seconds.  Neurological:     General: No focal deficit present.     Mental Status: She is alert and oriented to person, place, and time.  Psychiatric:        Mood and Affect: Mood normal.        Behavior: Behavior normal.      UC Treatments / Results  Labs (all labs ordered are listed, but only abnormal results are displayed) Labs Reviewed  NOVEL CORONAVIRUS, NAA    EKG   Radiology No results found.  Procedures Procedures (including critical care time)  Medications Ordered in UC Medications - No data to display  Initial Impression / Assessment and Plan / UC Course  I have reviewed the triage vital signs and the nursing notes.  Pertinent labs & imaging results that were available during my care of the patient were reviewed by me and considered in my medical decision making (see chart for details).     Sneezing Post nasal Drip Allergic Rhinitis: Presents with sneezing and postnasal drip for the last 3 days.  On exam, nasal congestion and rhinorrhea are present.  Patient did not cough during exam.  Lungs CTA bilaterally.  Heart sounds normal, S1-S2.  No abdominal tenderness noted.  Discussed with patient that she may want to take Zyrtec over-the-counter to help treat her allergies.  Covid screen today. Covid swab obtained in office today.  Patient instructed to quarantine until results are back and negative.  If results are negative, patient may resume daily schedule as tolerated once they are fever free for 24 hours without the use of antipyretic medications.  If results are positive, patient instructed to quarantine 10 days from today.  Patient instructed to follow-up with primary care with this office as needed.   Patient instructed to follow-up in the ER for trouble swallowing, trouble breathing, other concerning symptoms.  Final Clinical Impressions(s) / UC Diagnoses   Final diagnoses:  Allergic rhinitis due to other allergic trigger, unspecified seasonality  Encounter for screening for COVID-19  Post-nasal drip  Sneezing     Discharge Instructions     I would have you get some Zyrtec over-the-counter, and take this at least for the next 2 weeks to help with your allergy symptoms.  Take the ibuprofen as prescribed.  Rest and elevate your hand.  Apply ice packs 2-3 times a day for up to 20 minutes each.  Wear the Ace wrap as needed for comfort.    Follow up with your primary care provider or an orthopedist if you symptoms continue or worsen;  Or if you develop new symptoms, such as numbness, tingling, or weakness.       ED Prescriptions    None     PDMP not reviewed this encounter.   Faustino Congress, NP 03/23/20 1315

## 2020-03-23 NOTE — Assessment & Plan Note (Signed)
Not at goal on 20 telmisartan . Increase dose to 40 mg daily and report subsequent readings in one week

## 2020-03-23 NOTE — Assessment & Plan Note (Addendum)
Patient's symptoms were not reported to the front office during screening.  She revealed them to the MD once she was In the room.  She has no prior history of allergic rhinitis and has been using mucinex for symptoms of sinus congestion,  Sneezing and intermittent cough.  She has been strongly advised to obtain rapid testing for COVID 19 today at the Urgent Care across the street

## 2020-03-23 NOTE — Assessment & Plan Note (Signed)
She has been  historically Well controlled with diet alone. Continue  low glycemic index diet  And ARB.   I have also recommended that patient continue walking for 30 minutes a minimum of 5 days per week.   Lab Results  Component Value Date   HGBA1C 6.1 02/06/2020   Lab Results  Component Value Date   MICROALBUR 2.5 (H) 02/06/2020

## 2020-03-23 NOTE — Telephone Encounter (Signed)
Pt had labs done before her appt.

## 2020-03-23 NOTE — ED Triage Notes (Signed)
Pt c/o sinus congestion, sneezing, running nose, cough. Started about 5-6 days ago. She states she thinks it is allergies but want to be be sure. She state she feels fine. Denies fever.

## 2020-03-23 NOTE — Progress Notes (Signed)
Subjective:  Patient ID: Haley Love, female    DOB: 1933-12-28  Age: 86 y.o. MRN: [id number]  CC: Diagnoses of Essential hypertension, Viral upper respiratory tract infection, and Diabetes mellitus type 2, diet-controlled ([place]) were pertinent to this visit.  HPI Haley Love presents for follow up after her eye doctor diagnosed diabetic retinopathy  This visit occurred during the SARS-CoV-2 public health emergency.  Safety protocols were in place, including screening questions prior to the visit, additional usage of staff PPE, and extensive cleaning of exam room while observing appropriate contact time as indicated for disinfecting solutions.   However patient now reports that she has had intermittent sneezing and cough  accompanied by by sinus congestion for the past week.  She denies sick contacts,  body aches, fevers,  And dyspnea.    Patient has received NO  doses of the available COVID 19 vaccines.   Patient continues to mask when outside of the home except when walking in yard or at safe distances from others .  Patient denies any change in mood or development of unhealthy behaviors resuting from the pandemic's restriction of activities and socialization.     Hypertension: patient checks blood pressure twice weekly at home.  Readings have been for the most part > 140/80 at rest and often up to 350 systolic . Patient is following a reduce salt diet most days and is taking telmisartan 20 mg daily since her last visit when ACE Inhibitor was stopped.  She has not called back to notify us of the elevated readings , but was noted to have hypertensive/diabetic retinopathic changes on her recent eye exam.       Outpatient Medications Prior to Visit  Medication Sig Dispense Refill  . allopurinol (ZYLOPRIM) 300 MG tablet Take 1 tablet (300 mg total) by mouth daily. 90 tablet 4  . aspirin 81 MG tablet Take 81 mg by mouth daily.    Marland Kitchen b complex vitamins tablet Take 1 tablet by  mouth daily.    . B Complex-C (RA B-COMPLEX/VITAMIN C CR) TBCR Take by mouth.    Chong Sicilian, Borago officinalis, (BORAGE OIL PO) Take 1,000 mg by mouth daily.    . Calcium Carbonate-Vit D-Min (RA CALCIUM 600/VIT D/MINERALS) 600-200 MG-UNIT TABS Take tablets by mouth once a day.    . Cholecalciferol (VITAMIN D-3) 1000 UNITS CAPS Take 1 capsule by mouth at bedtime.    Marland Kitchen CINNAMON PO Take 2,000 mg by mouth at bedtime.    . Coenzyme Q10 (CO Q-10) 200 MG CAPS Take 1 capsule by mouth at bedtime.    . Collagenase POWD by Does not apply route daily at 6 (six) AM.    . Flaxseed, Linseed, (FLAX SEED OIL) 1000 MG CAPS Take by mouth.    . Indole-3-Carbinol POWD Take 200 mg by mouth 2 (two) times daily.    Javier Docker Oil 1500 MG CAPS Take by mouth.    . Magnesium 500 MG CAPS Take 1 capsule by mouth daily.     . Milk Thistle 300 MG CAPS Take by mouth daily at 6 (six) AM.    . Misc Natural Products (NARCOSOFT HERBAL LAX PO) Take 2 tablets by mouth at bedtime.     . mupirocin ointment (BACTROBAN) 2 % Apply 1 application topically 2 (two) times daily. 30 g 0  . Nutritional Supplements (NUTRITIONAL SUPPLEMENT PO) Take 3 capsules by mouth 2 (two) times daily. Neprinol AFD>    . Red Yeast Rice Extract (RED YEAST  RICE PO) Take 1,000 mg by mouth 2 (two) times daily.    . vitamin E 400 UNIT capsule Take 400 Units by mouth daily.    Marland Kitchen telmisartan (MICARDIS) 20 MG tablet TAKE 1 TABLET BY MOUTH EVERYDAY AT BEDTIME 90 tablet 1   No facility-administered medications prior to visit.    Review of Systems;  Patient denies headache, fevers, malaise, unintentional weight loss, skin rash, eye pain, sinus congestion and sinus pain, sore throat, dysphagia,  hemoptysis , cough, dyspnea, wheezing, chest pain, palpitations, orthopnea, edema, abdominal pain, nausea, melena, diarrhea, constipation, flank pain, dysuria, hematuria, urinary  Frequency, nocturia, numbness, tingling, seizures,  Focal weakness, Loss of consciousness,  Tremor,  insomnia, depression, anxiety, and suicidal ideation.      Objective:  BP (!) 156/90 (BP Location: Left Arm, Patient Position: Sitting, Cuff Size: Normal)   Pulse 91   Temp (!) 97.1 F (36.2 C) (Temporal)   Resp 16   Ht 5' 5.5" (1.664 m)   Wt 169 lb 12.8 oz (77 kg)   SpO2 97%   BMI 27.83 kg/m   BP Readings from Last 3 Encounters:  03/23/20 (!) 156/90  02/06/20 (!) 166/86  12/05/19 (!) 169/71    Wt Readings from Last 3 Encounters:  03/23/20 169 lb 12.8 oz (77 kg)  02/06/20 171 lb 3.2 oz (77.7 kg)  02/06/20 178 lb (80.7 kg)    General appearance: alert, cooperative and appears stated age Exam was not done due to current symptoms of congestion and cough.   Lab Results  Component Value Date   HGBA1C 6.1 02/06/2020   HGBA1C 6.5 08/06/2018   HGBA1C 6.7 (H) 02/01/2018    Lab Results  Component Value Date   CREATININE 1.06 (H) 03/22/2020   CREATININE 1.07 02/28/2020   CREATININE 1.01 02/06/2020    Lab Results  Component Value Date   WBC 48.2 (H) 03/22/2020   HGB 13.8 03/22/2020   HCT 41.8 03/22/2020   PLT 214 03/22/2020   GLUCOSE 96 03/22/2020   CHOL 193 02/06/2020   TRIG 242.0 (H) 02/06/2020   HDL 36.30 (L) 02/06/2020   LDLDIRECT 122.0 02/06/2020   LDLCALC 109 (H) 02/01/2018   ALT 23 03/22/2020   AST 26 03/22/2020   NA 142 03/22/2020   K 4.4 03/22/2020   CL 106 03/22/2020   CREATININE 1.06 (H) 03/22/2020   BUN 19 03/22/2020   CO2 26 03/22/2020   TSH 1.44 02/01/2018   INR 1.0 05/31/2014   HGBA1C 6.1 02/06/2020   MICROALBUR 2.5 (H) 02/06/2020    MM 3D SCREEN BREAST BILATERAL  Result Date: 08/05/2019 CLINICAL DATA:  Screening. EXAM: DIGITAL SCREENING BILATERAL MAMMOGRAM WITH TOMO AND CAD COMPARISON:  Previous exam(s). ACR Breast Density Category b: There are scattered areas of fibroglandular density. FINDINGS: There are no findings suspicious for malignancy. Images were processed with CAD. IMPRESSION: No mammographic evidence of malignancy. A result  letter of this screening mammogram will be mailed directly to the patient. RECOMMENDATION: Screening mammogram in one year. (Code:SM-B-01Y) BI-RADS CATEGORY  1: Negative. Electronically Signed   By: Lajean Manes M.D.   On: 08/05/2019 09:40    Assessment & Plan:   Problem List Items Addressed This Visit      Unprioritized   Diabetes mellitus type 2, diet-controlled ([place])    She has been  historically Well controlled with diet alone. Continue  low glycemic index diet  And ARB.   I have also recommended that patient continue walking for 30 minutes a minimum  of 5 days per week.   Lab Results  Component Value Date   HGBA1C 6.1 02/06/2020   Lab Results  Component Value Date   MICROALBUR 2.5 (H) 02/06/2020          Relevant Medications   telmisartan (MICARDIS) 40 MG tablet   Essential hypertension    Not at goal on 20 telmisartan . Increase dose to 40 mg daily and report subsequent readings in one week       Relevant Medications   telmisartan (MICARDIS) 40 MG tablet   Upper respiratory infection    Patient's symptoms were not reported to the front office during screening.  She revealed them to the MD once she was In the room.  She has no prior history of allergic rhinitis and has been using mucinex for symptoms of sinus congestion,  Sneezing and intermittent cough.  She has been strongly advised to obtain rapid testing for COVID 19 today at the Urgent Care across the street          I have changed Mirtie Bastyr. Hewson's telmisartan. I am also having her maintain her aspirin, CINNAMON PO, Misc Natural Products (NARCOSOFT HERBAL LAX PO), Co Q-10, Vitamin D-3, Flax Seed Oil, vitamin E, b complex vitamins, Krill Oil, Red Yeast Rice Extract (RED YEAST RICE PO), Magnesium, (Borage, Borago officinalis, (BORAGE OIL PO)), RA B-Complex/Vitamin C CR, RA Calcium 600/Vit D/Minerals, Indole-3-Carbinol, Nutritional Supplements (NUTRITIONAL SUPPLEMENT PO), Milk Thistle, Collagenase, mupirocin ointment,  and allopurinol.  Meds ordered this encounter  Medications  . telmisartan (MICARDIS) 40 MG tablet    Sig: Take 1 tablet (40 mg total) by mouth daily.    Dispense:  90 tablet    Refill:  1    Medications Discontinued During This Encounter  Medication Reason  . telmisartan (MICARDIS) 20 MG tablet     I provided  20 minutes of  face-to-face time during this encounter reviewing patient's current problems and past surgeries, labs and imaging studies, providing counseling on the above mentioned problems , and coordination  of care . Follow-up: No follow-ups on file.   Crecencio Mc, MD

## 2020-03-23 NOTE — Patient Instructions (Addendum)
Increase telmisartan  Today to 40 mg daily .Marland Kitchen  After one week   Call with blood pressure reading.  Goal   Is 120- 130/70    Stay home until your COVID TEST is negative   Use zyrtec , allegra or claritin  For the allergies  (no decongestant)

## 2020-03-23 NOTE — Discharge Instructions (Addendum)
I would have you get some Zyrtec over-the-counter, and take this at least for the next 2 weeks to help with your allergy symptoms.  Take the ibuprofen as prescribed.  Rest and elevate your hand.  Apply ice packs 2-3 times a day for up to 20 minutes each.  Wear the Ace wrap as needed for comfort.    Follow up with your primary care provider or an orthopedist if you symptoms continue or worsen;  Or if you develop new symptoms, such as numbness, tingling, or weakness.

## 2020-03-25 LAB — SARS-COV-2, NAA 2 DAY TAT

## 2020-03-25 LAB — NOVEL CORONAVIRUS, NAA: SARS-CoV-2, NAA: NOT DETECTED

## 2020-03-26 ENCOUNTER — Encounter: Payer: Self-pay | Admitting: Oncology

## 2020-03-30 ENCOUNTER — Telehealth: Payer: Self-pay | Admitting: Internal Medicine

## 2020-03-30 DIAGNOSIS — I10 Essential (primary) hypertension: Secondary | ICD-10-CM

## 2020-03-30 NOTE — Telephone Encounter (Signed)
Pt called to give BP results from the last week  Sat-153/73 Sun-140/73 Mon-133/64 AQLR-373/66 Wed-156/77 Thurs-133/69 Today 149/68

## 2020-04-02 NOTE — Telephone Encounter (Signed)
She has only been on the higher dose of telmisartan (40) for ten days,  So most of these readings were done before the first week of medication change.  Have her repeat readings daily for one more week  And resend

## 2020-04-02 NOTE — Telephone Encounter (Signed)
LMTCB

## 2020-04-04 NOTE — Telephone Encounter (Signed)
LMTCB

## 2020-04-10 NOTE — Telephone Encounter (Signed)
Spoke with Haley Love to let her know that Dr. Derrel Nip does not want to change any of her medications at this time and would like for Haley Love to continue checking her bp once daily for the next 5 days. Haley Love gave a verbal understanding.

## 2020-04-10 NOTE — Assessment & Plan Note (Signed)
Reviewed her home BP readings for the last tend days .  Exactly 50% are high and 50% are at goal ,  No changes to regimen since most recent 5 are mostly in range.   Continue to check daily for 5 more days

## 2020-04-10 NOTE — Telephone Encounter (Signed)
Date       B/P        HR  5/9       151/67     88 5/10     154/65     96 5/11     155/78    100 5/12     149/64    81 5/13     139/59    95 5/14     172/75    85 5/15     134/64    91 5/16     129/75    91 5/17     126/62    91 5/18     139/79    80

## 2020-04-10 NOTE — Telephone Encounter (Signed)
Reviewed her home BP readings for the last tend days .  Exactly 50% are high and 50% are at goal ,  No changes to regimen since most recent 5 are mostly in range.   Continue to check daily for 5 more days

## 2020-04-16 ENCOUNTER — Telehealth: Payer: Self-pay | Admitting: Internal Medicine

## 2020-04-16 NOTE — Telephone Encounter (Signed)
HER BP readings are all too high.  Please ask her to increase her telmisartan to 80 mg daily

## 2020-04-16 NOTE — Telephone Encounter (Signed)
Patient is calling in BP readings; 5/19; 155/82, pulse 85, 5/20; 154/78, pulse 84, 5/21; 166/92, pulse 76, 5/22; 140/62, pulse 90, 5/23; 166/66, pulse 89, 5/24; 148/78, pulse 87.

## 2020-04-17 ENCOUNTER — Other Ambulatory Visit: Payer: Self-pay

## 2020-04-17 MED ORDER — TELMISARTAN 80 MG PO TABS: 80.0000 mg | ORAL_TABLET | Freq: Every day | ORAL | 1 refills | Status: DC

## 2020-04-17 NOTE — Telephone Encounter (Signed)
I spoke with patient & she will start the 80mg . She is reluctant bc the dose sounds so high, but she said that she was not the doctor & would try it. I have sent to [facility] & patient will let us know some readings in one week to see how BP's are running

## 2020-04-24 ENCOUNTER — Telehealth: Payer: Self-pay | Admitting: Internal Medicine

## 2020-04-24 NOTE — Telephone Encounter (Signed)
All are elevated .Marland Kitchen  Please schedule appt

## 2020-04-24 NOTE — Telephone Encounter (Signed)
LMTCB

## 2020-04-24 NOTE — Telephone Encounter (Signed)
Patient called to give BP readings. 5/26; 140/71, pulse 91, 5/27; 153/75, pulse 83, 5/28, 152/71, pulse 76, 5/29; 154/75, pulse 82, 5/30; 150/69, pulse 87, 5/31; 161/78, pusle 76. 6/1; 159/79, pulse 84.

## 2020-04-25 NOTE — Telephone Encounter (Signed)
Pt has been scheduled for 04/26/2020.

## 2020-04-26 ENCOUNTER — Encounter: Payer: Self-pay | Admitting: Internal Medicine

## 2020-04-26 ENCOUNTER — Other Ambulatory Visit: Payer: Self-pay

## 2020-04-26 ENCOUNTER — Ambulatory Visit (INDEPENDENT_AMBULATORY_CARE_PROVIDER_SITE_OTHER): Payer: Medicare HMO | Admitting: Internal Medicine

## 2020-04-26 VITALS — BP 146/74 | HR 88 | Temp 96.7°F | Resp 14 | Ht 65.5 in | Wt 169.2 lb

## 2020-04-26 DIAGNOSIS — N1831 Chronic kidney disease, stage 3a: Secondary | ICD-10-CM | POA: Diagnosis not present

## 2020-04-26 DIAGNOSIS — I10 Essential (primary) hypertension: Secondary | ICD-10-CM

## 2020-04-26 DIAGNOSIS — D126 Benign neoplasm of colon, unspecified: Secondary | ICD-10-CM

## 2020-04-26 DIAGNOSIS — D472 Monoclonal gammopathy: Secondary | ICD-10-CM | POA: Diagnosis not present

## 2020-04-26 DIAGNOSIS — C91Z0 Other lymphoid leukemia not having achieved remission: Secondary | ICD-10-CM | POA: Diagnosis not present

## 2020-04-26 MED ORDER — HYDROCHLOROTHIAZIDE 25 MG PO TABS
25.0000 mg | ORAL_TABLET | Freq: Every morning | ORAL | 3 refills | Status: DC
Start: 2020-04-26 — End: 2020-05-31

## 2020-04-26 NOTE — Patient Instructions (Addendum)
Continue telmisartan in the evening Add hctz 25 mg in the morning  For more control. It is a diuretic and may help resolve your nighttime urinary frequency  RETURN IN 2 WEEKS FOR RN VISIT AND LAB CHECK      I will talk to the gastroenterologist about whether you need to have another colonoscopy   Kegel Exercises  Kegel exercises can help strengthen your pelvic floor muscles. The pelvic floor is a group of muscles that support your rectum, small intestine, and bladder. In females, pelvic floor muscles also help support the womb (uterus). These muscles help you control the flow of urine and stool. Kegel exercises are painless and simple, and they do not require any equipment. Your provider may suggest Kegel exercises to:  Improve bladder and bowel control.  Improve sexual response.  Improve weak pelvic floor muscles after surgery to remove the uterus (hysterectomy) or pregnancy (females).  Improve weak pelvic floor muscles after prostate gland removal or surgery (males). Kegel exercises involve squeezing your pelvic floor muscles, which are the same muscles you squeeze when you try to stop the flow of urine or keep from passing gas. The exercises can be done while sitting, standing, or lying down, but it is best to vary your position. Exercises How to do Kegel exercises: 1. Squeeze your pelvic floor muscles tight. You should feel a tight lift in your rectal area. If you are a female, you should also feel a tightness in your vaginal area. Keep your stomach, buttocks, and legs relaxed. 2. Hold the muscles tight for up to 10 seconds. 3. Breathe normally. 4. Relax your muscles. 5. Repeat as told by your health care provider. Repeat this exercise daily as told by your health care provider. Continue to do this exercise for at least 4-6 weeks, or for as long as told by your health care provider. You may be referred to a physical therapist who can help you learn more about how to do Kegel  exercises. Depending on your condition, your health care provider may recommend:  Varying how long you squeeze your muscles.  Doing several sets of exercises every day.  Doing exercises for several weeks.  Making Kegel exercises a part of your regular exercise routine. This information is not intended to replace advice given to you by your health care provider. Make sure you discuss any questions you have with your health care provider. Document Revised: 06/30/2018 Document Reviewed: 06/30/2018 Elsevier Patient Education  [place].   Overactive Bladder, Adult  Overactive bladder refers to a condition in which a person has a sudden need to pass urine. The person may leak urine if he or she cannot get to the bathroom fast enough (urinary incontinence). A person with this condition may also wake up several times in the night to go to the bathroom. Overactive bladder is associated with poor nerve signals between your bladder and your brain. Your bladder may get the signal to empty before it is full. You may also have very sensitive muscles that make your bladder squeeze too soon. These symptoms might interfere with daily work or social activities. What are the causes? This condition may be associated with or caused by:  Urinary tract infection.  Infection of nearby tissues, such as the prostate.  Prostate enlargement.  Surgery on the uterus or urethra.  Bladder stones, inflammation, or tumors.  Drinking too much caffeine or alcohol.  Certain medicines, especially medicines that get rid of extra fluid in the body (diuretics).  Muscle or nerve weakness, especially from: ? A spinal cord injury. ? Stroke. ? Multiple sclerosis. ? Parkinson's disease.  Diabetes.  Constipation. What increases the risk? You may be at greater risk for overactive bladder if you:  Are an older adult.  Smoke.  Are going through menopause.  Have prostate problems.  Have a neurological  disease, such as stroke, dementia, Parkinson's disease, or multiple sclerosis (MS).  Eat or drink things that irritate the bladder. These include alcohol, spicy food, and caffeine.  Are overweight or obese. What are the signs or symptoms? Symptoms of this condition include:  Sudden, strong urge to urinate.  Leaking urine.  Urinating 8 or more times a day.  Waking up to urinate 2 or more times a night. How is this diagnosed? Your health care provider may suspect overactive bladder based on your symptoms. He or she will diagnose this condition by:  A physical exam and medical history.  Blood or urine tests. You might need bladder or urine tests to help determine what is causing your overactive bladder. You might also need to see a health care provider who specializes in urinary tract problems (urologist). How is this treated? Treatment for overactive bladder depends on the cause of your condition and whether it is mild or severe. You can also make lifestyle changes at home. Options include:  Bladder training. This may include: ? Learning to control the urge to urinate by following a schedule that directs you to urinate at regular intervals (timed voiding). ? Doing Kegel exercises to strengthen your pelvic floor muscles, which support your bladder. Toning these muscles can help you control urination, even if your bladder muscles are overactive.  Special devices. This may include: ? Biofeedback, which uses sensors to help you become aware of your body's signals. ? Electrical stimulation, which uses electrodes placed inside the body (implanted) or outside the body. These electrodes send gentle pulses of electricity to strengthen the nerves or muscles that control the bladder. ? Women may use a plastic device that fits into the vagina and supports the bladder (pessary).  Medicines. ? Antibiotics to treat bladder infection. ? Antispasmodics to stop the bladder from releasing urine at the  wrong time. ? Tricyclic antidepressants to relax bladder muscles. ? Injections of botulinum toxin type A directly into the bladder tissue to relax bladder muscles.  Lifestyle changes. This may include: ? Weight loss. Talk to your health care provider about weight loss methods that would work best for you. ? Diet changes. This may include reducing how much alcohol and caffeine you consume, or drinking fluids at different times of the day. ? Not smoking. Do not use any products that contain nicotine or tobacco, such as cigarettes and e-cigarettes. If you need help quitting, ask your health care provider.  Surgery. ? A device may be implanted to help manage the nerve signals that control urination. ? An electrode may be implanted to stimulate electrical signals in the bladder. ? A procedure may be done to change the shape of the bladder. This is done only in very severe cases. Follow these instructions at home: Lifestyle  Make any diet or lifestyle changes that are recommended by your health care provider. These may include: ? Drinking less fluid or drinking fluids at different times of the day. ? Cutting down on caffeine or alcohol. ? Doing Kegel exercises. ? Losing weight if needed. ? Eating a healthy and balanced diet to prevent constipation. This may include:  Eating foods that are  high in fiber, such as fresh fruits and vegetables, whole grains, and beans.  Limiting foods that are high in fat and processed sugars, such as fried and sweet foods. General instructions  Take over-the-counter and prescription medicines only as told by your health care provider.  If you were prescribed an antibiotic medicine, take it as told by your health care provider. Do not stop taking the antibiotic even if you start to feel better.  Use any implants or pessary as told by your health care provider.  If needed, wear pads to absorb urine leakage.  Keep a journal or log to track how much and when  you drink and when you feel the need to urinate. This will help your health care provider monitor your condition.  Keep all follow-up visits as told by your health care provider. This is important. Contact a health care provider if:  You have a fever.  Your symptoms do not get better with treatment.  Your pain and discomfort get worse.  You have more frequent urges to urinate. Get help right away if:  You are not able to control your bladder. Summary  Overactive bladder refers to a condition in which a person has a sudden need to pass urine.  Several conditions may lead to an overactive bladder.  Treatment for overactive bladder depends on the cause and severity of your condition.  Follow your health care provider's instructions about lifestyle changes, doing Kegel exercises, keeping a journal, and taking medicines. This information is not intended to replace advice given to you by your health care provider. Make sure you discuss any questions you have with your health care provider. Document Revised: 03/03/2019 Document Reviewed: 11/26/2017 Elsevier Patient Education  [place].

## 2020-04-26 NOTE — Progress Notes (Signed)
Subjective:  Patient ID: Haley Love, female    DOB: [birth year]  Age: 86 y.o. MRN: [id number]  CC: The primary encounter diagnosis was Essential hypertension. 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  HPI Haley Love presents for follow up on hypertension  Home readings have been done and all are > 140/90 on current regimen of telmisartan 80 mg daily  Has chronic back pain,  Using relief factor three times daily for the past year and feels a significant improvement..  NOt taking NSAIDS   Outpatient Medications Prior to Visit  Medication Sig Dispense Refill   allopurinol (ZYLOPRIM) 300 MG tablet Take 1 tablet (300 mg total) by mouth daily. 90 tablet 4   aspirin 81 MG tablet Take 81 mg by mouth daily.     b complex vitamins tablet Take 1 tablet by mouth daily.     B Complex-C (RA B-COMPLEX/VITAMIN C CR) TBCR Take by mouth.     Borage, Borago officinalis, (BORAGE OIL PO) Take 1,000 mg by mouth daily.     Calcium Carbonate-Vit D-Min (RA CALCIUM 600/VIT D/MINERALS) 600-200 MG-UNIT TABS Take tablets by mouth once a day.     Cholecalciferol (VITAMIN D-3) 1000 UNITS CAPS Take 1 capsule by mouth at bedtime.     CINNAMON PO Take 2,000 mg by mouth at bedtime.     Coenzyme Q10 (CO Q-10) 200 MG CAPS Take 1 capsule by mouth at bedtime.     Collagenase POWD by Does not apply route daily at 6 (six) AM.     Flaxseed, Linseed, (FLAX SEED OIL) 1000 MG CAPS Take by mouth.     Indole-3-Carbinol POWD Take 200 mg by mouth 2 (two) times daily.     Krill Oil 1500 MG CAPS Take by mouth.     Magnesium 500 MG CAPS Take 1 capsule by mouth daily.      Milk Thistle 300 MG CAPS Take by mouth daily at 6 (six) AM.     Misc Natural Products (NARCOSOFT HERBAL LAX PO) Take 2 tablets by mouth at bedtime.      mupirocin  ointment (BACTROBAN) 2 % Apply 1 application topically 2 (two) times daily. 30 g 0   Nutritional Supplements (NUTRITIONAL SUPPLEMENT PO) Take 3 capsules by mouth 2 (two) times daily. Neprinol AFD>     Red Yeast Rice Extract (RED YEAST RICE PO) Take 1,000 mg by mouth 2 (two) times daily.     telmisartan (MICARDIS) 80 MG tablet Take 1 tablet (80 mg total) by mouth daily. 30 tablet 1   vitamin E 400 UNIT capsule Take 400 Units by mouth daily.     No facility-administered medications prior to visit.    Review of Systems;  Patient denies headache, fevers, malaise, unintentional weight loss, skin rash, eye pain, sinus congestion and sinus pain, sore throat, dysphagia,  hemoptysis , cough, dyspnea, wheezing, chest pain, palpitations, orthopnea, edema, abdominal pain, nausea, melena, diarrhea, constipation, flank pain, dysuria, hematuria, urinary  Frequency, nocturia, numbness, tingling, seizures,  Focal weakness, Loss of consciousness,  Tremor, insomnia, depression, anxiety, and suicidal ideation.      Objective:  BP (!) 146/74 (BP Location: Left Arm, Patient Position: Sitting, Cuff Size: Normal)    Pulse 88    Temp (!) 96.7 F (35.9 C) (Temporal)    Resp 14    Ht 5' 5.5" (1.664 m)  Wt 169 lb 3.2 oz (76.7 kg)    SpO2 99%    BMI 27.73 kg/m   BP Readings from Last 3 Encounters:  04/26/20 (!) 146/74  03/23/20 (!) 175/85  03/23/20 (!) 156/90    Wt Readings from Last 3 Encounters:  04/26/20 169 lb 3.2 oz (76.7 kg)  03/23/20 169 lb 12.1 oz (77 kg)  03/23/20 169 lb 12.8 oz (77 kg)    General appearance: alert, cooperative and appears stated age Ears: normal TM's and external ear canals both ears Throat: lips, mucosa, and tongue normal; teeth and gums normal Neck: no adenopathy, no carotid bruit, supple, symmetrical, trachea midline and thyroid not enlarged, symmetric, no tenderness/mass/nodules Back: symmetric, no curvature. ROM normal. No CVA tenderness. Lungs: clear to auscultation  bilaterally Heart: regular rate and rhythm, S1, S2 normal, no murmur, click, rub or gallop Abdomen: soft, non-tender; bowel sounds normal; no masses,  no organomegaly Pulses: 2+ and symmetric Skin: Skin color, texture, turgor normal. No rashes or lesions Lymph nodes: Cervical, supraclavicular, and axillary nodes normal.  Lab Results  Component Value Date   HGBA1C 6.1 02/06/2020   HGBA1C 6.5 08/06/2018   HGBA1C 6.7 (H) 02/01/2018    Lab Results  Component Value Date   CREATININE 1.06 (H) 03/22/2020   CREATININE 1.07 02/28/2020   CREATININE 1.01 02/06/2020    Lab Results  Component Value Date   WBC 48.2 (H) 03/22/2020   HGB 13.8 03/22/2020   HCT 41.8 03/22/2020   PLT 214 03/22/2020   GLUCOSE 96 03/22/2020   CHOL 193 02/06/2020   TRIG 242.0 (H) 02/06/2020   HDL 36.30 (L) 02/06/2020   LDLDIRECT 122.0 02/06/2020   LDLCALC 109 (H) 02/01/2018   ALT 23 03/22/2020   AST 26 03/22/2020   NA 142 03/22/2020   K 4.4 03/22/2020   CL 106 03/22/2020   CREATININE 1.06 (H) 03/22/2020   BUN 19 03/22/2020   CO2 26 03/22/2020   TSH 1.44 02/01/2018   INR 1.0 05/31/2014   HGBA1C 6.1 02/06/2020   MICROALBUR 2.5 (H) 02/06/2020    No results found.  Assessment & Plan:   Problem List Items Addressed This Visit      Unprioritized   CKD (chronic kidney disease) stage 3, GFR 30-59 ml/min    GFR remains at about 50 ml/min despite stopping daily use of Aleve.  Will recheck after starting hctz.   Lab Results  Component Value Date   CREATININE 1.06 (H) 03/22/2020         Essential hypertension - Primary    Improved but not at goal on maximal dose of telmisartan.  Adding hctz 25 mg daily repeat BMET needed   Lab Results  Component Value Date   NA 142 03/22/2020   K 4.4 03/22/2020   CL 106 03/22/2020   CO2 26 03/22/2020   Lab Results  Component Value Date   CREATININE 1.06 (H) 03/22/2020         Relevant Medications   hydrochlorothiazide (HYDRODIURIL) 25 MG tablet    Other Relevant Orders   Comprehensive metabolic panel   MGUS (monoclonal gammopathy of unknown significance)    M spikes have been stable. Continue surveillance.       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  No treatment other than allopurinol,  Continue quarterly CBC's.  No "B" symptoms   Lab Results  Component Value Date   WBC 48.2 (H) 03/22/2020   HGB 13.8  03/22/2020   HCT 41.8 03/22/2020   MCV 92.7 03/22/2020   PLT 214 03/22/2020         Tubular adenoma of colon    3 TA's were found during last colonoscopy in 2018 by Dr Vira Agar, two of which were on the right side.  Given her age, it is unclear whether the risks outweigh the benefits of continue screening.  Will discuss with gastroenterology        I provided  30 minutes of  face-to-face time during this encounter reviewing patient's current problems and past surgeries, labs and imaging studies, providing counseling on the above mentioned problems , and coordination  of care .  I am having Haley Love start on hydrochlorothiazide. I am also having her maintain her aspirin, CINNAMON PO, Misc Natural Products (NARCOSOFT HERBAL LAX PO), Co Q-10, Vitamin D-3, Flax Seed Oil, vitamin E, b complex vitamins, Krill Oil, Red Yeast Rice Extract (RED YEAST RICE PO), Magnesium, (Borage, Borago officinalis, (BORAGE OIL PO)), RA B-Complex/Vitamin C CR, RA Calcium 600/Vit D/Minerals, Indole-3-Carbinol, Nutritional Supplements (NUTRITIONAL SUPPLEMENT PO), Milk Thistle, Collagenase, mupirocin ointment, allopurinol, and telmisartan.  Meds ordered this encounter  Medications   hydrochlorothiazide (HYDRODIURIL) 25 MG tablet    Sig: Take 1 tablet (25 mg total) by mouth in the morning.    Dispense:  90 tablet    Refill:  3    There are no discontinued medications.  Follow-up: Return in about 2 weeks (around 05/10/2020).   Crecencio Mc, MD

## 2020-04-28 ENCOUNTER — Telehealth: Payer: Self-pay | Admitting: Internal Medicine

## 2020-04-28 DIAGNOSIS — D126 Benign neoplasm of colon, unspecified: Secondary | ICD-10-CM

## 2020-04-28 NOTE — Assessment & Plan Note (Addendum)
3 TA's were found during last colonoscopy in 2018 by Dr Vira Agar, two of which were on the right side.  Given her age, it is unclear whether the risks outweigh the benefits of continue screening.  Will discuss with gastroenterology

## 2020-04-28 NOTE — Assessment & Plan Note (Addendum)
Noted during 2020 workup for doubling of WBC count.  No treatment other than allopurinol,  Continue quarterly CBC's.  No "B" symptoms   Lab Results  Component Value Date   WBC 48.2 (H) 03/22/2020   HGB 13.8 03/22/2020   HCT 41.8 03/22/2020   MCV 92.7 03/22/2020   PLT 214 03/22/2020

## 2020-04-28 NOTE — Assessment & Plan Note (Signed)
Improved but not at goal on maximal dose of telmisartan.  Adding hctz 25 mg daily repeat BMET needed   Lab Results  Component Value Date   NA 142 03/22/2020   K 4.4 03/22/2020   CL 106 03/22/2020   CO2 26 03/22/2020   Lab Results  Component Value Date   CREATININE 1.06 (H) 03/22/2020

## 2020-04-28 NOTE — Assessment & Plan Note (Addendum)
M spikes have been stable. Continue surveillance.

## 2020-04-28 NOTE — Telephone Encounter (Signed)
I spoke with Dr Allen Norris regarding whether Haley Love should have another colonoscopy.  He responded that  the earliest repeat would be at a 5 yr interval , so she has 2 more years to decide on what to do/

## 2020-04-28 NOTE — Assessment & Plan Note (Addendum)
GFR remains at about 50 ml/min despite stopping daily use of Aleve.  Will recheck after starting hctz.   Lab Results  Component Value Date   CREATININE 1.06 (H) 03/22/2020

## 2020-04-30 NOTE — Telephone Encounter (Signed)
Spoke with pt and informed her of when she would be due for the next colonoscopy. Pt stated that she just wanted to make sure because she used to have them done every 3 years and she knew it had been 3 years since her last. Pt asked if you thought she should have a stool test done to check for blood just to make sure. Pt stated that she has no concern of blood in her stool she was just wanting to be cautious.

## 2020-04-30 NOTE — Telephone Encounter (Signed)
No, I do not advise checking for blood in stool at this time

## 2020-05-01 ENCOUNTER — Telehealth: Payer: Self-pay | Admitting: Internal Medicine

## 2020-05-01 ENCOUNTER — Other Ambulatory Visit: Payer: Self-pay | Admitting: Internal Medicine

## 2020-05-01 DIAGNOSIS — I10 Essential (primary) hypertension: Secondary | ICD-10-CM

## 2020-05-01 MED ORDER — METOPROLOL TARTRATE 25 MG PO TABS
25.0000 mg | ORAL_TABLET | Freq: Two times a day (BID) | ORAL | 3 refills | Status: AC
Start: 2020-05-01 — End: ?

## 2020-05-01 NOTE — Telephone Encounter (Signed)
Spoke with pt and informed her of the medication that Dr. Derrel Nip wants her to add and how to take it. Pt gave a verbal understanding.

## 2020-05-01 NOTE — Telephone Encounter (Signed)
Adding metoprolol 25 mg bid.  Continue telmisaratan 80 mg and hctz 25 mg.  Follow up one week with RN visit for BP check

## 2020-05-01 NOTE — Telephone Encounter (Signed)
Pt called and states that BP is 174/77 and pulse is 105. She does not report in other symptoms and did not want to be triaged. She would like a call back today. Please advise

## 2020-05-01 NOTE — Assessment & Plan Note (Signed)
Adding metoprolol 25 mg bid.  Continue telmisaratan 80 mg and hctz 25 mg.  Follow up one week with RN visit for BP check

## 2020-05-02 DIAGNOSIS — H903 Sensorineural hearing loss, bilateral: Secondary | ICD-10-CM | POA: Diagnosis not present

## 2020-05-09 ENCOUNTER — Other Ambulatory Visit: Payer: Self-pay | Admitting: Internal Medicine

## 2020-05-10 ENCOUNTER — Other Ambulatory Visit: Payer: Self-pay

## 2020-05-10 ENCOUNTER — Ambulatory Visit (INDEPENDENT_AMBULATORY_CARE_PROVIDER_SITE_OTHER): Payer: Medicare HMO

## 2020-05-10 DIAGNOSIS — I10 Essential (primary) hypertension: Secondary | ICD-10-CM

## 2020-05-10 DIAGNOSIS — H3500 Unspecified background retinopathy: Secondary | ICD-10-CM | POA: Diagnosis not present

## 2020-05-10 LAB — COMPREHENSIVE METABOLIC PANEL
ALT: 30 U/L (ref 0–35)
AST: 29 U/L (ref 0–37)
Albumin: 4.3 g/dL (ref 3.5–5.2)
Alkaline Phosphatase: 72 U/L (ref 39–117)
BUN: 29 mg/dL — ABNORMAL HIGH (ref 6–23)
CO2: 30 mEq/L (ref 19–32)
Calcium: 9.9 mg/dL (ref 8.4–10.5)
Chloride: 100 mEq/L (ref 96–112)
Creatinine, Ser: 1.05 mg/dL (ref 0.40–1.20)
GFR: 49.99 mL/min — ABNORMAL LOW (ref >60.00)
Glucose, Bld: 119 mg/dL — ABNORMAL HIGH (ref 70–99)
Potassium: 4.1 mEq/L (ref 3.5–5.1)
Sodium: 136 mEq/L (ref 135–145)
Total Bilirubin: 0.5 mg/dL (ref 0.2–1.2)
Total Protein: 7.3 g/dL (ref 6.0–8.3)

## 2020-05-10 LAB — HEMOGLOBIN A1C: Hgb A1c MFr Bld: 6.5 % (ref 4.6–6.5)

## 2020-05-10 NOTE — Progress Notes (Signed)
Patient is here for a BP check due to bp being high at last visit, as per patient.  Currently patients BP is 129/74 and BPM is 68.  Patient has no complaints of headaches, blurry vision, chest pain, arm pain, light headedness, dizziness, and nor jaw pain. Please see previous note for order.

## 2020-05-17 ENCOUNTER — Inpatient Hospital Stay: Payer: Medicare HMO | Attending: Oncology

## 2020-05-17 ENCOUNTER — Other Ambulatory Visit: Payer: Self-pay

## 2020-05-17 DIAGNOSIS — C50512 Malignant neoplasm of lower-outer quadrant of left female breast: Secondary | ICD-10-CM | POA: Diagnosis not present

## 2020-05-17 DIAGNOSIS — Z170 Estrogen receptor positive status [ER+]: Secondary | ICD-10-CM

## 2020-05-17 DIAGNOSIS — D472 Monoclonal gammopathy: Secondary | ICD-10-CM | POA: Insufficient documentation

## 2020-05-17 DIAGNOSIS — C9110 Chronic lymphocytic leukemia of B-cell type not having achieved remission: Secondary | ICD-10-CM | POA: Insufficient documentation

## 2020-05-17 DIAGNOSIS — C91Z0 Other lymphoid leukemia not having achieved remission: Secondary | ICD-10-CM

## 2020-05-17 LAB — COMPREHENSIVE METABOLIC PANEL
ALT: 27 U/L (ref 0–44)
AST: 25 U/L (ref 15–41)
Albumin: 3.6 g/dL (ref 3.5–5.0)
Alkaline Phosphatase: 75 U/L (ref 38–126)
Anion gap: 10 (ref 5–15)
BUN: 25 mg/dL — ABNORMAL HIGH (ref 8–23)
CO2: 26 mmol/L (ref 22–32)
Calcium: 9.9 mg/dL (ref 8.9–10.3)
Chloride: 102 mmol/L (ref 98–111)
Creatinine, Ser: 1.19 mg/dL — ABNORMAL HIGH (ref 0.44–1.00)
GFR calc Af Amer: 49 mL/min — ABNORMAL LOW (ref >60)
GFR calc non Af Amer: 42 mL/min — ABNORMAL LOW (ref >60)
Glucose, Bld: 117 mg/dL — ABNORMAL HIGH (ref 70–99)
Potassium: 4.2 mmol/L (ref 3.5–5.1)
Sodium: 138 mmol/L (ref 135–145)
Total Bilirubin: 0.5 mg/dL (ref 0.3–1.2)
Total Protein: 7.1 g/dL (ref 6.5–8.1)

## 2020-05-17 LAB — LACTATE DEHYDROGENASE: LDH: 436 U/L — ABNORMAL HIGH (ref 98–192)

## 2020-05-17 LAB — CBC WITH DIFFERENTIAL/PLATELET
Abs Immature Granulocytes: 0.5 10*3/uL — ABNORMAL HIGH (ref 0.00–0.07)
Basophils Absolute: 0 10*3/uL (ref 0.0–0.1)
Basophils Relative: 0 %
Eosinophils Absolute: 0.5 10*3/uL (ref 0.0–0.5)
Eosinophils Relative: 1 %
HCT: 40.5 % (ref 36.0–46.0)
Hemoglobin: 13.3 g/dL (ref 12.0–15.0)
Lymphocytes Relative: 87 %
Lymphs Abs: 47 10*3/uL — ABNORMAL HIGH (ref 0.7–4.0)
MCH: 30.7 pg (ref 26.0–34.0)
MCHC: 32.8 g/dL (ref 30.0–36.0)
MCV: 93.5 fL (ref 80.0–100.0)
Metamyelocytes Relative: 1 %
Monocytes Absolute: 0.5 10*3/uL (ref 0.1–1.0)
Monocytes Relative: 1 %
Neutro Abs: 5.4 10*3/uL (ref 1.7–7.7)
Neutrophils Relative %: 10 %
Platelets: 215 10*3/uL (ref 150–400)
RBC: 4.33 MIL/uL (ref 3.87–5.11)
RDW: 12.9 % (ref 11.5–15.5)
WBC: 54 10*3/uL (ref 4.0–10.5)
nRBC: 0 % (ref 0.0–0.2)

## 2020-05-18 LAB — IGG, IGA, IGM
IgA: 256 mg/dL (ref 64–422)
IgG (Immunoglobin G), Serum: 861 mg/dL (ref 586–1602)
IgM (Immunoglobulin M), Srm: 72 mg/dL (ref 26–217)

## 2020-05-18 LAB — KAPPA/LAMBDA LIGHT CHAINS
Kappa free light chain: 28.8 mg/L — ABNORMAL HIGH (ref 3.3–19.4)
Kappa, lambda light chain ratio: 1.31 (ref 0.26–1.65)
Lambda free light chains: 22 mg/L (ref 5.7–26.3)

## 2020-05-19 LAB — PROTEIN ELECTROPHORESIS, SERUM
A/G Ratio: 1.1 (ref 0.7–1.7)
Albumin ELP: 3.6 g/dL (ref 2.9–4.4)
Alpha-1-Globulin: 0.2 g/dL (ref 0.0–0.4)
Alpha-2-Globulin: 1 g/dL (ref 0.4–1.0)
Beta Globulin: 1.1 g/dL (ref 0.7–1.3)
Gamma Globulin: 0.8 g/dL (ref 0.4–1.8)
Globulin, Total: 3.2 g/dL (ref 2.2–3.9)
M-Spike, %: 0.4 g/dL — ABNORMAL HIGH
Total Protein ELP: 6.8 g/dL (ref 6.0–8.5)

## 2020-05-23 ENCOUNTER — Other Ambulatory Visit: Payer: Self-pay

## 2020-05-23 ENCOUNTER — Encounter: Payer: Self-pay | Admitting: *Deleted

## 2020-05-23 ENCOUNTER — Emergency Department
Admission: EM | Admit: 2020-05-23 | Discharge: 2020-05-23 | Disposition: A | Discharge status: Home / Self Care | Payer: Medicare HMO | Attending: Student | Admitting: Student

## 2020-05-23 ENCOUNTER — Emergency Department: Payer: Medicare HMO

## 2020-05-23 DIAGNOSIS — Z853 Personal history of malignant neoplasm of breast: Secondary | ICD-10-CM | POA: Diagnosis not present

## 2020-05-23 DIAGNOSIS — R1013 Epigastric pain: Secondary | ICD-10-CM | POA: Insufficient documentation

## 2020-05-23 DIAGNOSIS — E119 Type 2 diabetes mellitus without complications: Secondary | ICD-10-CM | POA: Insufficient documentation

## 2020-05-23 DIAGNOSIS — Z96651 Presence of right artificial knee joint: Secondary | ICD-10-CM | POA: Diagnosis not present

## 2020-05-23 DIAGNOSIS — Z9882 Breast implant status: Secondary | ICD-10-CM | POA: Diagnosis not present

## 2020-05-23 DIAGNOSIS — R110 Nausea: Secondary | ICD-10-CM | POA: Insufficient documentation

## 2020-05-23 DIAGNOSIS — Z7982 Long term (current) use of aspirin: Secondary | ICD-10-CM | POA: Diagnosis not present

## 2020-05-23 DIAGNOSIS — Z79899 Other long term (current) drug therapy: Secondary | ICD-10-CM | POA: Diagnosis not present

## 2020-05-23 DIAGNOSIS — R0789 Other chest pain: Secondary | ICD-10-CM | POA: Diagnosis present

## 2020-05-23 DIAGNOSIS — R079 Chest pain, unspecified: Secondary | ICD-10-CM | POA: Diagnosis not present

## 2020-05-23 LAB — CBC
HCT: 37.5 % (ref 36.0–46.0)
Hemoglobin: 12.4 g/dL (ref 12.0–15.0)
MCH: 30.5 pg (ref 26.0–34.0)
MCHC: 33.1 g/dL (ref 30.0–36.0)
MCV: 92.4 fL (ref 80.0–100.0)
Platelets: 237 10*3/uL (ref 150–400)
RBC: 4.06 MIL/uL (ref 3.87–5.11)
RDW: 12.9 % (ref 11.5–15.5)
WBC: 77.6 10*3/uL (ref 4.0–10.5)
nRBC: 0 % (ref 0.0–0.2)

## 2020-05-23 LAB — LIPASE, BLOOD: Lipase: 61 U/L — ABNORMAL HIGH (ref 11–51)

## 2020-05-23 LAB — TROPONIN I (HIGH SENSITIVITY)
Troponin I (High Sensitivity): 8 ng/L (ref <18)
Troponin I (High Sensitivity): 5 ng/L (ref <18)

## 2020-05-23 LAB — BASIC METABOLIC PANEL
Anion gap: 9 (ref 5–15)
BUN: 40 mg/dL — ABNORMAL HIGH (ref 8–23)
CO2: 26 mmol/L (ref 22–32)
Calcium: 10.1 mg/dL (ref 8.9–10.3)
Chloride: 100 mmol/L (ref 98–111)
Creatinine, Ser: 1.62 mg/dL — ABNORMAL HIGH (ref 0.44–1.00)
GFR calc Af Amer: 34 mL/min — ABNORMAL LOW (ref >60)
GFR calc non Af Amer: 29 mL/min — ABNORMAL LOW (ref >60)
Glucose, Bld: 130 mg/dL — ABNORMAL HIGH (ref 70–99)
Potassium: 4.4 mmol/L (ref 3.5–5.1)
Sodium: 135 mmol/L (ref 135–145)

## 2020-05-23 LAB — HEPATIC FUNCTION PANEL
ALT: 26 U/L (ref 0–44)
AST: 30 U/L (ref 15–41)
Albumin: 3.6 g/dL (ref 3.5–5.0)
Alkaline Phosphatase: 62 U/L (ref 38–126)
Bilirubin, Direct: <0.1 mg/dL (ref 0.0–0.2)
Total Bilirubin: 0.9 mg/dL (ref 0.3–1.2)
Total Protein: 6.8 g/dL (ref 6.5–8.1)

## 2020-05-23 MED ORDER — FAMOTIDINE IN NACL 20-0.9 MG/50ML-% IV SOLN
20.0000 mg | Freq: Once | INTRAVENOUS | Status: AC
  Administered 2020-05-23: 20 mg via INTRAVENOUS
  Filled 2020-05-23: qty 50

## 2020-05-23 MED ORDER — FAMOTIDINE 20 MG PO TABS
20.0000 mg | ORAL_TABLET | Freq: Two times a day (BID) | ORAL | 0 refills | Status: DC
Start: 2020-05-23 — End: 2020-05-31

## 2020-05-23 MED ORDER — ALUM & MAG HYDROXIDE-SIMETH 200-200-20 MG/5ML PO SUSP
30.0000 mL | Freq: Once | ORAL | Status: AC
  Administered 2020-05-23: 30 mL via ORAL
  Filled 2020-05-23: qty 30

## 2020-05-23 MED ORDER — SODIUM CHLORIDE 0.9 % IV BOLUS
1000.0000 mL | Freq: Once | INTRAVENOUS | Status: AC
  Administered 2020-05-23: 1000 mL via INTRAVENOUS

## 2020-05-23 NOTE — ED Provider Notes (Signed)
[facility] Emergency Department Provider Note  ____________________________________________   First MD Initiated Contact with Patient 05/23/20 0503     (approximate)  I have reviewed the triage vital signs and the nursing notes.  History  Chief Complaint Chest Pain    HPI Haley Love is a 86 y.o. female with hx of breast cancer, CLL who presents to the ER for lower central chest pain/epigastric discomfort. Symptoms started around 0200 and have improved since onset. She describes it as a "hurt" type discomfort, perhaps burning. She denies any true pain. She felt like the burning seemed to radiate up into her throat area.  Associated with nausea but no vomiting.  She does note that prior to going to bed she ate a large meal of spaghetti and garlic bread and salad.  She did have a bowel movement prior to arrival in the ER.  She denies any associated true chest pain. No shortness of breath. No diaphoresis. No history of GERD.  She was concerned that her symptoms may represent atypical cardiac symptoms and therefore presented for evaluation.   Past Medical Hx Past Medical History:  Diagnosis Date  . Breast cancer ([place])   . Cataract   . Chickenpox   . Contact lens/glasses fitting   . Diabetes mellitus without complication ([place])   . Hyperlipidemia   . Hypertension   . Inflammatory polyps of colon [facility])     Problem List Patient Active Problem List   Diagnosis Date Noted  . Gouty arthritis of great toe 05/13/2019  . T-cell chronic lymphocytic leukemia ([place]) 09/20/2018  . 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 Myalgia due to statin 08/09/2017  . Goals of care, counseling/discussion 08/06/2017  . Diabetes mellitus type 2, diet-controlled ([place]) 03/01/2016  . Essential hypertension 02/27/2016  . MGUS (monoclonal gammopathy of unknown significance) 08/19/2014  . CKD (chronic kidney disease)  stage 3, GFR 30-59 ml/min 08/13/2014  . Malignant neoplasm of lower-outer quadrant of left breast of female, estrogen receptor positive ([place]) 10/31/2013  . S/P TKR (total knee replacement) 08/04/2013  . Encounter for Medicare annual wellness exam 08/04/2013  . History of scabies 08/03/2013  . Hyperlipidemia with target LDL less than 100 12/22/2012  . Tubular adenoma of colon 12/21/2012  . Dermatitis 12/14/2012    Past Surgical Hx Past Surgical History:  Procedure Laterality Date  . BREAST LUMPECTOMY Left 07/28/2012  . BREAST SURGERY  9/13   lt lump  . cataracts  2014  . COLONOSCOPY WITH PROPOFOL N/A 01/05/2017   Procedure: COLONOSCOPY WITH PROPOFOL;  Surgeon: Manya Silvas, MD;  Location: [facility] ENDOSCOPY;  Service: Endoscopy;  Laterality: N/A;  . JOINT REPLACEMENT Right July 2015   [place], right knee  . KNEE ARTHROSCOPY W/ MENISCAL REPAIR  09/23/11   Right  . MASTECTOMY, PARTIAL  08/06/2012   Procedure: MASTECTOMY PARTIAL;  Surgeon: Adin Hector, MD;  Location: [facility];  Service: General;  Laterality: Left;  Left partial mastectomy with re-excision of margins    Medications Prior to Admission medications   Medication Sig Start Date End Date Taking? Authorizing Provider  allopurinol (ZYLOPRIM) 300 MG tablet Take 1 tablet (300 mg total) by mouth daily. 05/16/19   Magrinat, Virgie Dad, MD  aspirin 81 MG tablet Take 81 mg by mouth daily.    [provider]  b complex vitamins tablet Take 1 tablet by mouth daily.    [provider]  B Complex-C (RA  B-COMPLEX/VITAMIN C CR) TBCR Take by mouth.    [provider]  Borage, Borago officinalis, (BORAGE OIL PO) Take 1,000 mg by mouth daily.    [provider]  Calcium Carbonate-Vit D-Min (RA CALCIUM 600/VIT D/MINERALS) 600-200 MG-UNIT TABS Take tablets by mouth once a day.    [provider]  Cholecalciferol (VITAMIN D-3) 1000 UNITS CAPS Take 1 capsule by mouth at bedtime.     [provider]  CINNAMON PO Take 2,000 mg by mouth at bedtime.    [provider]  Coenzyme Q10 (CO Q-10) 200 MG CAPS Take 1 capsule by mouth at bedtime.    [provider]  Collagenase POWD by Does not apply route daily at 6 (six) AM.    [provider]  Flaxseed, Linseed, (FLAX SEED OIL) 1000 MG CAPS Take by mouth.    [provider]  hydrochlorothiazide (HYDRODIURIL) 25 MG tablet Take 1 tablet (25 mg total) by mouth in the morning. 04/26/20   Crecencio Mc, MD  Indole-3-Carbinol POWD Take 200 mg by mouth 2 (two) times daily.    [provider]  Javier Docker Oil 1500 MG CAPS Take by mouth.    [provider]  Magnesium 500 MG CAPS Take 1 capsule by mouth daily.     [provider]  metoprolol tartrate (LOPRESSOR) 25 MG tablet Take 1 tablet (25 mg total) by mouth 2 (two) times daily. 05/01/20   Crecencio Mc, MD  Milk Thistle 300 MG CAPS Take by mouth daily at 6 (six) AM.    [provider]  Misc Natural Products (NARCOSOFT HERBAL LAX PO) Take 2 tablets by mouth at bedtime.     [provider]  mupirocin ointment (BACTROBAN) 2 % Apply 1 application topically 2 (two) times daily. 08/13/18   McLean-Scocuzza, Nino Glow, MD  Nutritional Supplements (NUTRITIONAL SUPPLEMENT PO) Take 3 capsules by mouth 2 (two) times daily. Neprinol AFD>    [provider]  Red Yeast Rice Extract (RED YEAST RICE PO) Take 1,000 mg by mouth 2 (two) times daily.    [provider]  telmisartan (MICARDIS) 80 MG tablet TAKE 1 TABLET BY MOUTH EVERY DAY 05/10/20   Crecencio Mc, MD  vitamin E 400 UNIT capsule Take 400 Units by mouth daily.    [provider]    Allergies No known allergies  Family Hx Family History  Problem Relation Age of Onset  . Lung cancer Mother   . Arthritis Mother   . Hyperlipidemia Mother   . Hypertension Mother   . Throat cancer Father   . Hyperlipidemia Father   . Cancer Paternal  Aunt        breast  . Dementia Brother   . Leukemia Brother        lymphatic /CLL    Social Hx Social History   Tobacco Use  . Smoking status: Never Smoker  . Smokeless tobacco: Never Used  Vaping Use  . Vaping Use: Never used  Substance Use Topics  . Alcohol use: Yes    Comment: 1 glass wine week  . Drug use: No     Review of Systems  Constitutional: Negative for fever. Negative for chills. Eyes: Negative for visual changes. ENT: Negative for sore throat. Cardiovascular: Negative for chest pain. Respiratory: Negative for shortness of breath. Gastrointestinal: + epigastric burning Genitourinary: Negative for dysuria. Musculoskeletal: Negative for leg swelling. Skin: Negative for rash. Neurological: Negative for headaches.   Physical Exam  Vital Signs:  ED Triage Vitals  Enc Vitals Group     BP 05/23/20 0336 (!) 168/71     Pulse Rate 05/23/20 0336 86     Resp 05/23/20 0336 16     Temp 05/23/20 0336 98.5 F (36.9 C)     Temp Source 05/23/20 0336 Oral     SpO2 05/23/20 0336 97 %     Weight 05/23/20 0337 169 lb 1.5 oz (76.7 kg)     Height 05/23/20 0337 5' 5.5" (1.664 m)     Head Circumference --      Peak Flow --      Pain Score 05/23/20 0336 0     Pain Loc --      Pain Edu? --      Excl. in [place]? --     Constitutional: Alert and oriented. Well appearing. NAD.  Head: Normocephalic. Atraumatic. Eyes: Conjunctivae clear. Sclera anicteric. Pupils equal and symmetric. Nose: No masses or lesions. No congestion or rhinorrhea. Mouth/Throat: Wearing mask.  Neck: No stridor. Trachea midline.  Cardiovascular: Normal rate, regular rhythm. Extremities well perfused. Respiratory: Normal respiratory effort.  Lungs CTAB. Gastrointestinal: Soft. Non-distended. Non-tender.  Genitourinary: Deferred. Musculoskeletal: No lower extremity edema. No deformities. Neurologic:  Normal speech and language. No gross focal or lateralizing neurologic deficits are appreciated.  Skin:  Skin is warm, dry and intact. No rash noted. Psychiatric: Mood and affect are appropriate for situation.  EKG  Personally reviewed and interpreted by myself.   Date: 05/23/20 Time: 0336 Rate: 86 Rhythm: sinus Axis: normal Intervals: slightly long PR, 208 ms NSR w/ premature contractions No acute ischemic changes No STEMI    Radiology  Personally reviewed available imaging myself.   CXR - IMPRESSION:  No acute chest findings.    Procedures  Procedure(s) performed (including critical care):  Procedures   Initial Impression / Assessment and Plan / MDM / ED Course  86 y.o. female who presents to the ED for epigastric burning pain, as above  Ddx: GERD/gastritis, esophagitis, atypical ACS, pancreatitis  Will plan for labs, imaging, EKG  EKG with PACs, but otherwise no acute ischemic changes. Troponin x 2 negative. CXR negative.  Creatinine slightly increased from prior, 1.62 today compared to 1.19 six days ago.  Lipase slightly elevated at 61. Perhaps GERD/mild pancreatitis contributing to her symptoms.  HFP unremarkable.  White count is notably elevated at 77.6, which is consistent with her history of CLL.  Did discuss this with on-call oncology, Dr. Grayland Ormond.  This value is not unexpected in the setting of her history.  No further acute oncologic concerns with regards to her symptoms + this leukocytosis (in particular, not at risk for leukostasis).  This should otherwise just be followed by her outpatient oncologist.  Will treat for GERD with famotidine and GI cocktail, as well as IV fluids.  Patient reports improvement in symptoms with this.  Given her otherwise negative work-up, feel she is stable for discharge with outpatient follow-up.  Will provide Rx for famotidine.  Advised close PCP and oncology follow-up.  She and son at bedside both voiced understanding of this and are comfortable with the plan and discharge.  Given return  precautions.   _______________________________   As part of my medical decision making I have reviewed available labs, radiology tests, reviewed old records/performed chart review, and discussed with consultants (oncology, Grayland Ormond).     Final Clinical Impression(s) / ED Diagnosis  Epigastric burning pain    Note:  This document was prepared using Dragon voice recognition  software and may include unintentional dictation errors.   Lilia Pro., MD 05/23/20 [phone]

## 2020-05-23 NOTE — ED Triage Notes (Addendum)
pt to ED reporting chest pain and burning. Pt ate spaghetti this evening and reports bloating later this evening. Nausea without vomiting or diarrhea. Pt took 2 ASA at 2:00 this morning without relief.   No known hx of GERD. No SOB, cough, lightheadedness or dizziness.

## 2020-05-23 NOTE — ED Notes (Signed)
Pt reports that she has had no pain, only discomfort in the left chest. States that has decreased now and pt only feels nauseated. Pt states she at a large dinner of spaghetti and tossed salad with garlic bread tonight for dinner.

## 2020-05-23 NOTE — Discharge Instructions (Signed)
Thank you for letting us take care of you in the emergency department today.   Please continue to take any regular, prescribed medications.   New medications we have prescribed:  Famotidine - to help w/ reflux and heartburn  Please follow up with: Your primary care doctor to review your ER visit and follow up on your symptoms. Please also follow up with your oncologist for a recheck of your blood counts.   Please return to the ER for any new or worsening symptoms.

## 2020-05-24 ENCOUNTER — Telehealth: Payer: Self-pay | Admitting: Internal Medicine

## 2020-05-24 NOTE — Telephone Encounter (Signed)
Copy of lab work has been printed and mailed to pt.

## 2020-05-24 NOTE — Telephone Encounter (Signed)
Pt needs a copy of lab work mailed to her home so she can give a copy to her eye doctor.  She also wanted Dr. Derrel Nip to know that she went to ER yesterday for acid reflux. She said she don't think she needs a follow up. She just wanted Dr. Derrel Nip to look over her information.

## 2020-05-29 ENCOUNTER — Telehealth: Payer: Self-pay | Admitting: Internal Medicine

## 2020-05-31 ENCOUNTER — Emergency Department: Payer: Medicare HMO

## 2020-05-31 ENCOUNTER — Encounter: Payer: Self-pay | Admitting: Emergency Medicine

## 2020-05-31 ENCOUNTER — Other Ambulatory Visit: Payer: Self-pay

## 2020-05-31 ENCOUNTER — Encounter: Payer: Self-pay | Admitting: Internal Medicine

## 2020-05-31 ENCOUNTER — Ambulatory Visit (INDEPENDENT_AMBULATORY_CARE_PROVIDER_SITE_OTHER): Payer: Medicare HMO | Admitting: Internal Medicine

## 2020-05-31 ENCOUNTER — Inpatient Hospital Stay
Admission: EM | Admit: 2020-05-31 | Discharge: 2020-06-05 | DRG: 683 | Disposition: A | Discharge status: Other Acute Inpatient Hospital | Payer: Medicare HMO | Source: Ambulatory Visit | Attending: Internal Medicine | Admitting: Internal Medicine

## 2020-05-31 ENCOUNTER — Inpatient Hospital Stay: Payer: Medicare HMO

## 2020-05-31 ENCOUNTER — Ambulatory Visit (INDEPENDENT_AMBULATORY_CARE_PROVIDER_SITE_OTHER): Payer: Medicare HMO

## 2020-05-31 ENCOUNTER — Telehealth: Payer: Self-pay

## 2020-05-31 VITALS — BP 120/60 | HR 84 | Temp 98.2°F | Ht 65.51 in | Wt 170.0 lb

## 2020-05-31 DIAGNOSIS — Z7401 Bed confinement status: Secondary | ICD-10-CM | POA: Diagnosis not present

## 2020-05-31 DIAGNOSIS — E883 Tumor lysis syndrome: Secondary | ICD-10-CM | POA: Diagnosis present

## 2020-05-31 DIAGNOSIS — R0789 Other chest pain: Secondary | ICD-10-CM

## 2020-05-31 DIAGNOSIS — Z79899 Other long term (current) drug therapy: Secondary | ICD-10-CM | POA: Diagnosis not present

## 2020-05-31 DIAGNOSIS — E785 Hyperlipidemia, unspecified: Secondary | ICD-10-CM | POA: Diagnosis not present

## 2020-05-31 DIAGNOSIS — C91Z0 Other lymphoid leukemia not having achieved remission: Secondary | ICD-10-CM

## 2020-05-31 DIAGNOSIS — R140 Abdominal distension (gaseous): Secondary | ICD-10-CM | POA: Diagnosis not present

## 2020-05-31 DIAGNOSIS — E11649 Type 2 diabetes mellitus with hypoglycemia without coma: Secondary | ICD-10-CM | POA: Diagnosis present

## 2020-05-31 DIAGNOSIS — E860 Dehydration: Secondary | ICD-10-CM | POA: Diagnosis present

## 2020-05-31 DIAGNOSIS — K2100 Gastro-esophageal reflux disease with esophagitis, without bleeding: Secondary | ICD-10-CM

## 2020-05-31 DIAGNOSIS — Z806 Family history of leukemia: Secondary | ICD-10-CM

## 2020-05-31 DIAGNOSIS — J90 Pleural effusion, not elsewhere classified: Secondary | ICD-10-CM | POA: Diagnosis not present

## 2020-05-31 DIAGNOSIS — R8281 Pyuria: Secondary | ICD-10-CM | POA: Diagnosis present

## 2020-05-31 DIAGNOSIS — I129 Hypertensive chronic kidney disease with stage 1 through stage 4 chronic kidney disease, or unspecified chronic kidney disease: Secondary | ICD-10-CM | POA: Diagnosis present

## 2020-05-31 DIAGNOSIS — N184 Chronic kidney disease, stage 4 (severe): Secondary | ICD-10-CM

## 2020-05-31 DIAGNOSIS — N1831 Chronic kidney disease, stage 3a: Secondary | ICD-10-CM

## 2020-05-31 DIAGNOSIS — R109 Unspecified abdominal pain: Secondary | ICD-10-CM | POA: Diagnosis not present

## 2020-05-31 DIAGNOSIS — N189 Chronic kidney disease, unspecified: Secondary | ICD-10-CM | POA: Insufficient documentation

## 2020-05-31 DIAGNOSIS — Z853 Personal history of malignant neoplasm of breast: Secondary | ICD-10-CM | POA: Diagnosis not present

## 2020-05-31 DIAGNOSIS — C9110 Chronic lymphocytic leukemia of B-cell type not having achieved remission: Secondary | ICD-10-CM | POA: Diagnosis not present

## 2020-05-31 DIAGNOSIS — R531 Weakness: Secondary | ICD-10-CM | POA: Diagnosis not present

## 2020-05-31 DIAGNOSIS — E1122 Type 2 diabetes mellitus with diabetic chronic kidney disease: Secondary | ICD-10-CM | POA: Diagnosis present

## 2020-05-31 DIAGNOSIS — E119 Type 2 diabetes mellitus without complications: Secondary | ICD-10-CM

## 2020-05-31 DIAGNOSIS — K5900 Constipation, unspecified: Secondary | ICD-10-CM | POA: Diagnosis present

## 2020-05-31 DIAGNOSIS — N179 Acute kidney failure, unspecified: Secondary | ICD-10-CM

## 2020-05-31 DIAGNOSIS — I7090 Unspecified atherosclerosis: Secondary | ICD-10-CM | POA: Diagnosis not present

## 2020-05-31 DIAGNOSIS — E875 Hyperkalemia: Secondary | ICD-10-CM | POA: Diagnosis not present

## 2020-05-31 DIAGNOSIS — K3189 Other diseases of stomach and duodenum: Secondary | ICD-10-CM | POA: Diagnosis not present

## 2020-05-31 DIAGNOSIS — Z801 Family history of malignant neoplasm of trachea, bronchus and lung: Secondary | ICD-10-CM | POA: Diagnosis not present

## 2020-05-31 DIAGNOSIS — K5909 Other constipation: Secondary | ICD-10-CM

## 2020-05-31 DIAGNOSIS — Z7982 Long term (current) use of aspirin: Secondary | ICD-10-CM | POA: Diagnosis not present

## 2020-05-31 DIAGNOSIS — E861 Hypovolemia: Secondary | ICD-10-CM

## 2020-05-31 DIAGNOSIS — Z66 Do not resuscitate: Secondary | ICD-10-CM | POA: Diagnosis not present

## 2020-05-31 DIAGNOSIS — I959 Hypotension, unspecified: Secondary | ICD-10-CM | POA: Insufficient documentation

## 2020-05-31 DIAGNOSIS — D72829 Elevated white blood cell count, unspecified: Secondary | ICD-10-CM | POA: Diagnosis not present

## 2020-05-31 DIAGNOSIS — Z515 Encounter for palliative care: Secondary | ICD-10-CM | POA: Diagnosis not present

## 2020-05-31 DIAGNOSIS — E278 Other specified disorders of adrenal gland: Secondary | ICD-10-CM | POA: Diagnosis not present

## 2020-05-31 DIAGNOSIS — I9589 Other hypotension: Secondary | ICD-10-CM | POA: Diagnosis not present

## 2020-05-31 DIAGNOSIS — I10 Essential (primary) hypertension: Secondary | ICD-10-CM

## 2020-05-31 DIAGNOSIS — E790 Hyperuricemia without signs of inflammatory arthritis and tophaceous disease: Secondary | ICD-10-CM | POA: Diagnosis not present

## 2020-05-31 DIAGNOSIS — R935 Abnormal findings on diagnostic imaging of other abdominal regions, including retroperitoneum: Secondary | ICD-10-CM

## 2020-05-31 DIAGNOSIS — C9190 Lymphoid leukemia, unspecified not having achieved remission: Secondary | ICD-10-CM | POA: Diagnosis not present

## 2020-05-31 DIAGNOSIS — K449 Diaphragmatic hernia without obstruction or gangrene: Secondary | ICD-10-CM | POA: Diagnosis not present

## 2020-05-31 DIAGNOSIS — I700 Atherosclerosis of aorta: Secondary | ICD-10-CM | POA: Diagnosis not present

## 2020-05-31 DIAGNOSIS — Z20822 Contact with and (suspected) exposure to covid-19: Secondary | ICD-10-CM | POA: Diagnosis not present

## 2020-05-31 LAB — HEPATIC FUNCTION PANEL
ALT: 28 U/L (ref 0–44)
AST: 43 U/L — ABNORMAL HIGH (ref 15–41)
Albumin: 3.2 g/dL — ABNORMAL LOW (ref 3.5–5.0)
Alkaline Phosphatase: 75 U/L (ref 38–126)
Bilirubin, Direct: 0.3 mg/dL — ABNORMAL HIGH (ref 0.0–0.2)
Indirect Bilirubin: 0.8 mg/dL (ref 0.3–0.9)
Total Bilirubin: 1.1 mg/dL (ref 0.3–1.2)
Total Protein: 6.3 g/dL — ABNORMAL LOW (ref 6.5–8.1)

## 2020-05-31 LAB — CBC WITH DIFFERENTIAL/PLATELET
Basophils Absolute: 0.7 10*3/uL — ABNORMAL HIGH (ref 0.0–0.1)
Basophils Relative: 1 % (ref 0.0–3.0)
Eosinophils Absolute: 0 10*3/uL (ref 0.0–0.7)
Eosinophils Relative: 0 % (ref 0.0–5.0)
HCT: 37.4 % (ref 36.0–46.0)
Hemoglobin: 11.9 g/dL — ABNORMAL LOW (ref 12.0–15.0)
Lymphocytes Relative: 82.8 % — ABNORMAL HIGH (ref 12.0–46.0)
Lymphs Abs: 62.5 10*3/uL — ABNORMAL HIGH (ref 0.7–4.0)
MCHC: 31.9 g/dL (ref 30.0–36.0)
MCV: 94.6 fl (ref 78.0–100.0)
Monocytes Absolute: 0.4 10*3/uL (ref 0.1–1.0)
Monocytes Relative: 0.6 % — ABNORMAL LOW (ref 3.0–12.0)
Neutro Abs: 11.8 10*3/uL — ABNORMAL HIGH (ref 1.4–7.7)
Neutrophils Relative %: 15.6 % — ABNORMAL LOW (ref 43.0–77.0)
Platelets: 257 10*3/uL (ref 150.0–400.0)
RBC: 3.95 Mil/uL (ref 3.87–5.11)
RDW: 13.3 % (ref 11.5–15.5)
WBC: 75.5 10*3/uL (ref 4.0–10.5)

## 2020-05-31 LAB — BASIC METABOLIC PANEL
Anion gap: 13 (ref 5–15)
BUN: 61 mg/dL — ABNORMAL HIGH (ref 8–23)
CO2: 22 mmol/L (ref 22–32)
Calcium: 11.9 mg/dL — ABNORMAL HIGH (ref 8.9–10.3)
Chloride: 93 mmol/L — ABNORMAL LOW (ref 98–111)
Creatinine, Ser: 3.92 mg/dL — ABNORMAL HIGH (ref 0.44–1.00)
GFR calc Af Amer: 12 mL/min — ABNORMAL LOW (ref >60)
GFR calc non Af Amer: 10 mL/min — ABNORMAL LOW (ref >60)
Glucose, Bld: 111 mg/dL — ABNORMAL HIGH (ref 70–99)
Potassium: 5.2 mmol/L — ABNORMAL HIGH (ref 3.5–5.1)
Sodium: 128 mmol/L — ABNORMAL LOW (ref 135–145)

## 2020-05-31 LAB — COMPREHENSIVE METABOLIC PANEL
ALT: 24 U/L (ref 0–35)
AST: 35 U/L (ref 0–37)
Albumin: 3.6 g/dL (ref 3.5–5.2)
Alkaline Phosphatase: 79 U/L (ref 39–117)
BUN: 55 mg/dL — ABNORMAL HIGH (ref 6–23)
CO2: 27 mEq/L (ref 19–32)
Calcium: 12.2 mg/dL — ABNORMAL HIGH (ref 8.4–10.5)
Chloride: 93 mEq/L — ABNORMAL LOW (ref 96–112)
Creatinine, Ser: 3.38 mg/dL — ABNORMAL HIGH (ref 0.40–1.20)
GFR: 12.97 mL/min — CL (ref >60.00)
Glucose, Bld: 102 mg/dL — ABNORMAL HIGH (ref 70–99)
Potassium: 5.2 mEq/L — ABNORMAL HIGH (ref 3.5–5.1)
Sodium: 128 mEq/L — ABNORMAL LOW (ref 135–145)
Total Bilirubin: 0.8 mg/dL (ref 0.2–1.2)
Total Protein: 6 g/dL (ref 6.0–8.3)

## 2020-05-31 LAB — CBC
HCT: 35.2 % — ABNORMAL LOW (ref 36.0–46.0)
Hemoglobin: 12.2 g/dL (ref 12.0–15.0)
MCH: 30.5 pg (ref 26.0–34.0)
MCHC: 34.7 g/dL (ref 30.0–36.0)
MCV: 88 fL (ref 80.0–100.0)
Platelets: 269 10*3/uL (ref 150–400)
RBC: 4 MIL/uL (ref 3.87–5.11)
RDW: 13.3 % (ref 11.5–15.5)
WBC: 88 10*3/uL (ref 4.0–10.5)
nRBC: 0 % (ref 0.0–0.2)

## 2020-05-31 LAB — GLUCOSE, CAPILLARY: Glucose-Capillary: 104 mg/dL — ABNORMAL HIGH (ref 70–99)

## 2020-05-31 LAB — SARS CORONAVIRUS 2 BY RT PCR (HOSPITAL ORDER, PERFORMED IN ~~LOC~~ HOSPITAL LAB): SARS Coronavirus 2: NEGATIVE

## 2020-05-31 LAB — LIPASE, BLOOD: Lipase: 40 U/L (ref 11–51)

## 2020-05-31 LAB — MAGNESIUM: Magnesium: 1.7 mg/dL (ref 1.5–2.5)

## 2020-05-31 MED ORDER — SODIUM CHLORIDE 0.9 % IV SOLN: INTRAVENOUS | Status: DC

## 2020-05-31 MED ORDER — OMEPRAZOLE 20 MG PO CPDR
20.0000 mg | DELAYED_RELEASE_CAPSULE | Freq: Two times a day (BID) | ORAL | 3 refills | Status: AC
Start: 2020-05-31 — End: ?

## 2020-05-31 MED ORDER — ACETAMINOPHEN 650 MG RE SUPP: 650.0000 mg | Freq: Four times a day (QID) | RECTAL | Status: DC | PRN

## 2020-05-31 MED ORDER — PANTOPRAZOLE SODIUM 40 MG PO TBEC
40.0000 mg | DELAYED_RELEASE_TABLET | Freq: Every day | ORAL | Status: DC
  Administered 2020-06-01 – 2020-06-03 (×3): 40 mg via ORAL
  Filled 2020-05-31 (×4): qty 1

## 2020-05-31 MED ORDER — METOPROLOL TARTRATE 25 MG PO TABS
25.0000 mg | ORAL_TABLET | Freq: Two times a day (BID) | ORAL | Status: DC
  Administered 2020-05-31 – 2020-06-04 (×7): 25 mg via ORAL
  Filled 2020-05-31 (×8): qty 1

## 2020-05-31 MED ORDER — SODIUM CHLORIDE 0.9 % IV BOLUS
1000.0000 mL | Freq: Once | INTRAVENOUS | Status: AC
  Administered 2020-05-31: 1000 mL via INTRAVENOUS

## 2020-05-31 MED ORDER — ACETAMINOPHEN 325 MG PO TABS
650.0000 mg | ORAL_TABLET | Freq: Four times a day (QID) | ORAL | Status: DC | PRN
  Administered 2020-06-02 – 2020-06-04 (×4): 650 mg via ORAL
  Filled 2020-05-31 (×5): qty 2

## 2020-05-31 MED ORDER — ONDANSETRON 4 MG PO TBDP: 4.0000 mg | ORAL_TABLET | Freq: Three times a day (TID) | ORAL | 0 refills | Status: AC | PRN

## 2020-05-31 MED ORDER — LACTULOSE 20 GM/30ML PO SOLN: ORAL | 3 refills | Status: DC

## 2020-05-31 MED ORDER — HEPARIN SODIUM (PORCINE) 5000 UNIT/ML IJ SOLN
5000.0000 [IU] | Freq: Three times a day (TID) | INTRAMUSCULAR | Status: DC
  Administered 2020-06-01 – 2020-06-04 (×10): 5000 [IU] via SUBCUTANEOUS
  Filled 2020-05-31 (×10): qty 1

## 2020-05-31 NOTE — Assessment & Plan Note (Signed)
BP was 82/50 by CMA but 120/60 by me during visit.  hctz suspended. ,  Increase water intake.  Checking lytes and CR

## 2020-05-31 NOTE — Telephone Encounter (Signed)
Critical value received .  Nothing to do..  It was expected

## 2020-05-31 NOTE — Assessment & Plan Note (Signed)
History suggestive of worsening of chronic constipation triggered by cessation of natural senna laxative (triple dose)  Plain abd films,  Recheck lytes.  Lactulose if no evidence of SBO

## 2020-05-31 NOTE — Assessment & Plan Note (Signed)
Renal function was rechecked in June after starting hctz in April and was unchanged at 50 ml/min.  Current decline noted, attributed to dehydration,  But hctz was not stopped during ER visit.  HCTZ discontinued today

## 2020-05-31 NOTE — ED Notes (Signed)
Patient transported to CT 

## 2020-05-31 NOTE — ED Notes (Signed)
EDP in room  

## 2020-05-31 NOTE — Assessment & Plan Note (Signed)
Rechecking WBC given rise of 20K in one week   Lab Results  Component Value Date   WBC 77.6 (HH) 05/23/2020   HGB 12.4 05/23/2020   HCT 37.5 05/23/2020   MCV 92.4 05/23/2020   PLT 237 05/23/2020

## 2020-05-31 NOTE — Assessment & Plan Note (Signed)
GFR is now < 20 ml/min based on today's labs and she is mildly hyperkalemic in the setting of hypotension and dehydration .  Patient advised to return to ER for treatment and admission

## 2020-05-31 NOTE — Assessment & Plan Note (Signed)
ER records reviewed.  She ruled out for AMI with negative troponins x 2 and was treated for dehydration and GERD esophagitis .

## 2020-05-31 NOTE — H&P (Signed)
History and Physical        Hospital Admission Note Date: 05/31/2020  Patient name: Haley Love Medical record number: [id number] Date of birth: Mar 22, 1934 Age: 86 y.o. Gender: female  PCP: Crecencio Mc, MD    Patient coming from: Home per PCP recommendation   I have reviewed all records in the [facility].    Chief Complaint:  Abnormal Lab and Elevated Cr   HPI: Haley Love is 86 y.o. female with CLL, DM, HTN, HLD who presents to ED per PCP recommendation for Cr 3.38 and WBC 75. Patient endorsing intermittent generalized weakness for the past week. Was seen in ED 6/30 for similar complaints. No improvement since then. Reports persistent weakness. Says this is the worst she has ever felt. She has had diminished appetite. Still drinking fluids but says intake has not been enough. Has struggled with constipation and has been taking laxatives, held for the past two days. On Telmisartan, dose has been increased over time from 20 to 80 mg. Was taking HCTZ until stopped by PCP at visit today.    ED work-up/course: Patient is a 86 year old who comes in with abnormal labs.  Patient's white count is significantly elevated from baseline.  Patient does have a history of CLL.  Will discuss with oncology to see if they want any additional labs to evaluate for blast crisis.  Kidney functions also elevated.  Will get post void to make sure no signs of urinary retention.  CT scan given some left leg pain to make sure no signs of kidney stone or retained mass.  Patient denies any shortness of breath distress PE or chest pain suggest ACS.   Labs consistent with new AKI.  Post void is negative.  CT scan without evidence of kidney stone.  She does have some proliferation of her lymph nodes.  Possibly secondary to her known CLL.  There was concern something on her chest patient does not have any shortness of breath.  I did tell  the hospitalist about this so they can get a CT if they would like.  D.w Dr. Janese Banks onc-okay to come to stay here.  Unlikely to have blast crisis with CLL.  Most likely just some additional lymphoproliferation due to the AKI.  Correct the underlying issue.  Patient was given 1 L of fluids for the AKI.  I discussed the hospital team for admission.     Review of Systems: Positives marked in 'bold' Constitutional: Denies fever, chills, diaphoresis, poor appetite and fatigue.  HEENT: Denies photophobia, eye pain, redness, hearing loss, ear pain, congestion, sore throat, rhinorrhea, sneezing, mouth sores, trouble swallowing, neck pain, neck stiffness and tinnitus.   Respiratory: Denies SOB, DOE, cough, chest tightness,  and wheezing.   Cardiovascular: Denies chest pain, palpitations and leg swelling.  Gastrointestinal: Denies nausea, vomiting, abdominal pain, diarrhea, constipation, blood in stool and abdominal distention.  Genitourinary: Denies dysuria, urgency, frequency, hematuria, flank pain and difficulty urinating.  Musculoskeletal: Denies myalgias, back pain, joint swelling, arthralgias and gait problem.  Skin: Denies pallor, rash and wound.  Neurological: Denies dizziness, seizures, syncope, weakness, light-headedness, numbness and headaches.  Hematological: Denies adenopathy. Easy bruising, personal  or family bleeding history  Psychiatric/Behavioral: Denies suicidal ideation, mood changes, confusion, nervousness, sleep disturbance and agitation  Past Medical History: Past Medical History:  Diagnosis Date  . Breast cancer ([place])   . Cataract   . Chickenpox   . Contact lens/glasses fitting   . Diabetes mellitus without complication ([place])   . Hyperlipidemia   . Hypertension   . Inflammatory polyps of colon [facility])     Past Surgical History:  Procedure Laterality Date  . BREAST LUMPECTOMY Left 07/28/2012  . BREAST SURGERY  9/13   lt lump  . cataracts  2014  . COLONOSCOPY WITH  PROPOFOL N/A 01/05/2017   Procedure: COLONOSCOPY WITH PROPOFOL;  Surgeon: Manya Silvas, MD;  Location: [facility] ENDOSCOPY;  Service: Endoscopy;  Laterality: N/A;  . JOINT REPLACEMENT Right July 2015   [place], right knee  . KNEE ARTHROSCOPY W/ MENISCAL REPAIR  09/23/11   Right  . MASTECTOMY, PARTIAL  08/06/2012   Procedure: MASTECTOMY PARTIAL;  Surgeon: Adin Hector, MD;  Location: [facility];  Service: General;  Laterality: Left;  Left partial mastectomy with re-excision of margins    Medications: Prior to Admission medications   Medication Sig Start Date End Date Taking? Authorizing Provider  allopurinol (ZYLOPRIM) 300 MG tablet Take 1 tablet (300 mg total) by mouth daily. 05/16/19   Magrinat, Virgie Dad, MD  aspirin 81 MG tablet Take 81 mg by mouth daily.    [provider]  B Complex-C (RA B-COMPLEX/VITAMIN C CR) TBCR Take by mouth.    [provider]  Borage, Borago officinalis, (BORAGE OIL PO) Take 1,000 mg by mouth daily.    [provider]  Calcium Carbonate-Vit D-Min (RA CALCIUM 600/VIT D/MINERALS) 600-200 MG-UNIT TABS Take tablets by mouth once a day.    [provider]  Cholecalciferol (VITAMIN D-3) 1000 UNITS CAPS Take 1 capsule by mouth at bedtime.    [provider]  CINNAMON PO Take 2,000 mg by mouth at bedtime.    [provider]  Coenzyme Q10 (CO Q-10) 200 MG CAPS Take 1 capsule by mouth at bedtime.    [provider]  Collagenase POWD by Does not apply route daily at 6 (six) AM.    [provider]  Flaxseed, Linseed, (FLAX SEED OIL) 1000 MG CAPS Take by mouth.    [provider]  Javier Docker Oil 1500 MG CAPS Take by mouth.    [provider]  Lactulose 20 GM/30ML SOLN 30 ml every 4 hours until constipation is relieved 05/31/20   Crecencio Mc, MD  Magnesium 500 MG CAPS Take 1 capsule by mouth daily.     [provider]  metoprolol tartrate (LOPRESSOR) 25 MG tablet Take  1 tablet (25 mg total) by mouth 2 (two) times daily. 05/01/20   Crecencio Mc, MD  Milk Thistle 300 MG CAPS Take by mouth daily at 6 (six) AM.    [provider]  Misc Natural Products (NARCOSOFT HERBAL LAX PO) Take 2 tablets by mouth at bedtime.     [provider]  omeprazole (PRILOSEC) 20 MG capsule Take 1 capsule (20 mg total) by mouth 2 (two) times daily before a meal. 05/31/20   Derrel Nip, Aris Everts, MD  ondansetron (ZOFRAN ODT) 4 MG disintegrating tablet Take 1 tablet (4 mg total) by mouth every 8 (eight) hours as needed for nausea or vomiting. 05/31/20   Crecencio Mc, MD  Red Yeast Rice Extract (RED YEAST RICE PO) Take 1,000  mg by mouth 2 (two) times daily.    [provider]  Sodium Hyaluronate, oral, (HYALURONIC ACID) 100 MG CAPS Take 1 capsule by mouth.    [provider]  telmisartan (MICARDIS) 80 MG tablet TAKE 1 TABLET BY MOUTH EVERY DAY Patient taking differently: Take 80 mg by mouth at bedtime.  05/10/20   Crecencio Mc, MD  vitamin E 400 UNIT capsule Take 400 Units by mouth daily.    [provider]    Allergies:   Allergies  Allergen Reactions  . No Known Allergies     Social History:  reports that she has never smoked. She has never used smokeless tobacco. She reports current alcohol use. She reports that she does not use drugs.  Family History: Family History  Problem Relation Age of Onset  . Lung cancer Mother   . Arthritis Mother   . Hyperlipidemia Mother   . Hypertension Mother   . Throat cancer Father   . Hyperlipidemia Father   . Cancer Paternal Aunt        breast  . Dementia Brother   . Leukemia Brother        lymphatic /CLL    Physical Exam: Blood pressure (!) 139/46, pulse 91, temperature 98.6 F (37 C), temperature source Oral, resp. rate 13, height 5\' 5"  (1.651 m), weight 77 kg, SpO2 98 %. General: Alert, awake, oriented x3, in no acute distress. Eyes: pink conjunctiva,anicteric sclera, pupils equal and  reactive to light and accomodation, HEENT: normocephalic, atraumatic, oropharynx clear Neck: supple, no masses or lymphadenopathy, no goiter, no bruits, no JVD CVS: Regular rate and rhythm, without murmurs, rubs or gallops. No lower extremity edema Resp : Clear to auscultation bilaterally, no wheezing, rales or rhonchi. GI : Soft, nontender, nondistended, positive bowel sounds, no masses. No hepatomegaly. No hernia.  Musculoskeletal: No clubbing or cyanosis, positive pedal pulses. No contracture. ROM intact  Neuro: Grossly intact, no focal neurological deficits, strength 5/5 upper and lower extremities bilaterally Psych: alert and oriented x 3, normal mood and affect Skin: no rashes or lesions, warm and dry   LABS on Admission: I have personally reviewed all the labs and imagings below    Basic Metabolic Panel: Recent Labs  Lab 05/31/20 1245 05/31/20 1804  NA 128* 128*  K 5.2* 5.2*  CL 93* 93*  CO2 27 22  GLUCOSE 102* 111*  BUN 55* 61*  CREATININE 3.38* 3.92*  CALCIUM 12.2* 11.9*  MG 1.7  --    Liver Function Tests: Recent Labs  Lab 05/31/20 1245 05/31/20 1810  AST 35 43*  ALT 24 28  ALKPHOS 79 75  BILITOT 0.8 1.1  PROT 6.0 6.3*  ALBUMIN 3.6 3.2*   No results for input(s): LIPASE, AMYLASE in the last 168 hours. No results for input(s): AMMONIA in the last 168 hours. CBC: Recent Labs  Lab 05/31/20 1245 05/31/20 1245 05/31/20 1804  WBC 75.5 Repeated and verified X2.*  --  86.9*  88.0*  NEUTROABS 11.8*   < > 11.3*  HGB 11.9*  --  12.3  12.2  HCT 37.4  --  35.1*  35.2*  MCV 94.6   < > 89.3  88.0  PLT 257.0  --  271  269   < > = values in this interval not displayed.   Cardiac Enzymes: No results for input(s): CKTOTAL, CKMB, CKMBINDEX, TROPONINI in the last 168 hours. BNP: Invalid input(s): POCBNP CBG: Recent Labs  Lab 05/31/20 1819  GLUCAP 104*  Radiological Exams on Admission:  DG Abd 1 View  Result Date: 05/31/2020 CLINICAL DATA:  Lower  abdominal pain. EXAM: ABDOMEN - 1 VIEW COMPARISON:  Subsequent abdominal CT, available at time of radiograph interpretation. FINDINGS: No bowel dilatation to suggest obstruction. Small volume of colonic stool. No evidence of free air. No radiopaque calculi. Right pelvic phlebolith. Aortic atherosclerosis. Scoliosis and degenerative change in the spine. IMPRESSION: Normal bowel gas pattern. Electronically Signed   By: Keith Rake M.D.   On: 05/31/2020 19:49   CT Renal Stone Study  Result Date: 05/31/2020 CLINICAL DATA:  Flank pain, kidney stone suspected. Worsening kidney function. Weakness for 2 weeks. White blood cell count 75. EXAM: CT ABDOMEN AND PELVIS WITHOUT CONTRAST TECHNIQUE: Multidetector CT imaging of the abdomen and pelvis was performed following the standard protocol without IV contrast. COMPARISON:  Radiograph earlier today. FINDINGS: Lower chest: Anterior lingular opacity abutting the left heart border is only partially included in the field of view. There is additional streaky atelectasis in the lower lobes. Trace left pleural effusion. Small to moderate hiatal hernia. Coronary artery calcifications. There are small epicardial nodes. Hepatobiliary: No evidence of focal hepatic lesion on noncontrast exam. No calcified gallstone or pericholecystic inflammation. No biliary dilatation. Pancreas: No ductal dilatation or inflammation. Fullness in the region the pancreatic head which is adjacent to retroperitoneal fullness/adenopathy. This is not well assessed on noncontrast exam. Spleen: Normal in size. Greatest splenic dimension 12.6 cm. No focal abnormality on noncontrast exam. Adrenals/Urinary Tract: Bilateral adrenal thickening. No hydronephrosis. No renal or ureteral calculi. Urinary bladder is completely empty and not well assessed. Stomach/Bowel: Bowel evaluation is limited in the absence of contrast and presence of intra-abdominal ascites. Small to moderate hiatal hernia. Stomach is  decompressed. There is no evidence of small-bowel obstruction. Equivocal wall thickening of the distal and terminal ileum versus nondistention. Normal appendix. Majority of the colon is nondistended and not well assessed. There is no colonic inflammation. Small volume of stool in the sigmoid colon and rectum. Vascular/Lymphatic: Aortic atherosclerosis. No aortic aneurysm. Multifocal adenopathy throughout the abdomen and pelvis. Majority of the lymph nodes are 10-15 mm. There are enlarged retrocrural, retroperitoneal, mesenteric and omental nodes. Retroperitoneal adenopathy is difficult to delineate from the adjacent vascular structures. Representative left periaortic node at the level of the left kidney measures 14 mm, series 2, image 36. Representative ileocolic node measures 10 mm., series 2, image 55. Central mesenteric node measures 15 mm, series 2, image 48. There is no inguinal adenopathy. There is generalized mesenteric and omental edema and fat stranding. Reproductive: Uterus is grossly normal. Limited adnexal assessment. Other: Generalized mesenteric and omental edema and fat stranding. There is a small volume of abdominopelvic ascites. No free air. Mild subcutaneous edema of the anterior abdominal wall. Musculoskeletal: Multilevel degenerative change throughout the spine. Near complete disc space loss at L1-L2. No focal osseous abnormality. IMPRESSION: 1. No renal stones or obstructive uropathy. 2. Multifocal adenopathy throughout the abdomen and pelvis, including retrocrural, retroperitoneal, mesenteric, and omental enlarged nodes. Majority of these nodes measure 10-15 mm. In the setting of markedly elevated white blood cell count findings are suspicious for lymphoproliferative disorder. Metastatic disease is also considered. 3. Equivocal wall thickening of the distal and terminal ileum versus nondistention. 4. Generalized edema of the mesentery in the anterior omentum. Small volume abdominopelvic  ascites. 5. Anterior lingular opacity abutting the left heart border is only partially included in the field of view. This is nonspecific in the setting. Consider completion imaging with chest  CT. 6. Small to moderate hiatal hernia. Aortic Atherosclerosis (ICD10-I70.0). Electronically Signed   By: Keith Rake M.D.   On: 05/31/2020 19:48      EKG: Independently reviewed.    Assessment/Plan Active Problems:   Hyperlipidemia with target LDL less than 100   Leukocytosis   Essential hypertension   Diabetes mellitus type 2, diet-controlled (HCC)   T-cell chronic lymphocytic leukemia (HCC)   AKI (acute kidney injury) (HCC)  AKI Cr 3.92. BUN 61. GFR 10. K 5.2. Baseline Cr appears to be around 1.1. At ED visit on 6/30, had uptrended to 1.6. CT renal study negative for stones, hydronephrosis, urinary obstruction. Bladder scan did not show increased PVR. Suspect related to dehydration from poor PO intake along with use of thiazide diuretic and ARB. Unclear if she was also taking Lactulose. Denies NSAID use. Given 1L IVF bolus in ED. Electrolytes are stable. While nausea and anorexia may be signs of uremia, patient does not have any symptoms consistent with severe uremia that would warrant more urgent intervention.  -admit to MedSurg  -continue IVFs at 125 cc/hr overnight  -repeat BMET in AM  -if renal fxn not improved, would consult nephrology  -obtain CXR due to streaky atelectasis in lower lobes and trace pleural effusion on left; patient without SOB or chest pain---pending CXR findings could consider CT chest per CT renal impression recs -hold Arb   -avoid nephrotoxic agents   Leukocytosis with history of T cell CLL  Recent WBC trend 54>75>88. CT renal study showed multifocal adenopathy which is most likely consistent with lymphoproliferative disorder. ED provider discussed case with Oncology who stated unlikely to have blast crisis with CLL. Thought leukocytosis related to additional  lymphoproliferation due to AKI. Recommended correction of underlying etiology.  -f/u oncology outpatient   HTN  Normotensive to slightly elevated.  -continue Metoprolol  -hold Telmisartan due to AKI   T2DM Diet controlled. Last A1c 6.5% on 05/10/20. Glucose 111 on BMET. -monitor glucose on AM labs  -add SSI if needed    DVT prophylaxis: Heparin SQ  CODE STATUS: FULL   Consults called: None   Family Communication: Admission, patients condition and plan of care including tests being ordered have been discussed with the patient and patient's daughter who indicates understanding and agree with the plan and Code Status  Admission status: Inpatient   The medical decision making on this patient was of high complexity and the patient is at high risk for clinical deterioration, therefore this is a level 3 admission.  Severity of Illness:      The appropriate patient status for this patient is INPATIENT. Inpatient status is judged to be reasonable and necessary in order to provide the required intensity of service to ensure the patient's safety. The patient's presenting symptoms, physical exam findings, and initial radiographic and laboratory data in the context of their chronic comorbidities is felt to place them at high risk for further clinical deterioration. Furthermore, it is not anticipated that the patient will be medically stable for discharge from the hospital within 2 midnights of admission. The following factors support the patient status of inpatient.   " The patient's presenting symptoms include generalized weakness, nausea, poor appetite. " The worrisome physical exam findings include none. " The initial radiographic and laboratory data are worrisome because of AKI, leukocytosis, CT renal with multifocal adenopathy. " The chronic co-morbidities include CLL, DM, HTN, HLD .   * I certify that at the point of admission it is my clinical judgment  that the patient will require  inpatient hospital care spanning beyond 2 midnights from the point of admission due to high intensity of service, high risk for further deterioration and high frequency of surveillance required.*    Time Spent on Admission: 55 minutes      Melina Schools D.O.  [facility] 05/31/2020, 9:25 PM

## 2020-05-31 NOTE — ED Provider Notes (Signed)
[facility] Emergency Department Provider Note  ____________________________________________   First MD Initiated Contact with Patient 05/31/20 1805     (approximate)  I have reviewed the triage vital signs and the nursing notes.   HISTORY  Chief Complaint Abnormal Lab and Weakness    HPI Haley Love is a 86 y.o. female with CLL, diabetes, hypertension, hyperlipidemia who comes in for abnormal labs.  Patient sent by Dr. Derrel Nip for elevated white count of 75 and creatinine of 3.38.  Patient does report some intermittent weakness and lightheadedness for the past week, nothing makes better, nothing makes it worse.  Patient stated that her blood pressure meds were increased recently.  She does report a little bit of back pain as well.  Patient states that for her CLL she is not any chemotherapy and radiation and they are just monitoring it.          Past Medical History:  Diagnosis Date  . Breast cancer ([place])   . Cataract   . Chickenpox   . Contact lens/glasses fitting   . Diabetes mellitus without complication ([place])   . Hyperlipidemia   . Hypertension   . Inflammatory polyps of colon [facility])     Patient Active Problem List   Diagnosis Date Noted  . Other constipation 05/31/2020  . Abdominal bloating 05/31/2020  . Hypotension 05/31/2020  . GERD with esophagitis 05/31/2020  . Atypical chest pain 05/31/2020  . Acute on chronic kidney failure ([place]) 05/31/2020  . Gouty arthritis of great toe 05/13/2019  . 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 Myalgia due to statin 08/09/2017  . Goals of care, counseling/discussion 08/06/2017  . Diabetes mellitus type 2, diet-controlled ([place]) 03/01/2016  . Essential hypertension 02/27/2016  . MGUS (monoclonal gammopathy of unknown significance) 08/19/2014  . CKD (chronic kidney disease) stage  3, GFR 30-59 ml/min 08/13/2014  . Malignant neoplasm of lower-outer quadrant of left breast of female, estrogen receptor positive ([place]) 10/31/2013  . S/P TKR (total knee replacement) 08/04/2013  . Encounter for Medicare annual wellness exam 08/04/2013  . History of scabies 08/03/2013  . Hyperlipidemia with target LDL less than 100 12/22/2012  . Tubular adenoma of colon 12/21/2012  . Dermatitis 12/14/2012    Past Surgical History:  Procedure Laterality Date  . BREAST LUMPECTOMY Left 07/28/2012  . BREAST SURGERY  9/13   lt lump  . cataracts  2014  . COLONOSCOPY WITH PROPOFOL N/A 01/05/2017   Procedure: COLONOSCOPY WITH PROPOFOL;  Surgeon: Manya Silvas, MD;  Location: [facility] ENDOSCOPY;  Service: Endoscopy;  Laterality: N/A;  . JOINT REPLACEMENT Right July 2015   [place], right knee  . KNEE ARTHROSCOPY W/ MENISCAL REPAIR  09/23/11   Right  . MASTECTOMY, PARTIAL  08/06/2012   Procedure: MASTECTOMY PARTIAL;  Surgeon: Adin Hector, MD;  Location: [facility];  Service: General;  Laterality: Left;  Left partial mastectomy with re-excision of margins    Prior to Admission medications   Medication Sig Start Date End Date Taking? Authorizing Provider  allopurinol (ZYLOPRIM) 300 MG tablet Take 1 tablet (300 mg total) by mouth daily. 05/16/19   Magrinat, Virgie Dad, MD  aspirin 81 MG tablet Take 81 mg by mouth daily.    [provider]  B Complex-C (RA B-COMPLEX/VITAMIN C CR) TBCR Take by mouth.    [provider]  Borage, Borago officinalis, (BORAGE OIL PO) Take 1,000  mg by mouth daily.    [provider]  Calcium Carbonate-Vit D-Min (RA CALCIUM 600/VIT D/MINERALS) 600-200 MG-UNIT TABS Take tablets by mouth once a day.    [provider]  Cholecalciferol (VITAMIN D-3) 1000 UNITS CAPS Take 1 capsule by mouth at bedtime.    [provider]  CINNAMON PO Take 2,000 mg by mouth at bedtime.    [provider]  Coenzyme Q10 (CO Q-10)  200 MG CAPS Take 1 capsule by mouth at bedtime.    [provider]  Collagenase POWD by Does not apply route daily at 6 (six) AM.    [provider]  Flaxseed, Linseed, (FLAX SEED OIL) 1000 MG CAPS Take by mouth.    [provider]  Javier Docker Oil 1500 MG CAPS Take by mouth.    [provider]  Lactulose 20 GM/30ML SOLN 30 ml every 4 hours until constipation is relieved 05/31/20   Crecencio Mc, MD  Magnesium 500 MG CAPS Take 1 capsule by mouth daily.     [provider]  metoprolol tartrate (LOPRESSOR) 25 MG tablet Take 1 tablet (25 mg total) by mouth 2 (two) times daily. 05/01/20   Crecencio Mc, MD  Milk Thistle 300 MG CAPS Take by mouth daily at 6 (six) AM.    [provider]  Misc Natural Products (NARCOSOFT HERBAL LAX PO) Take 2 tablets by mouth at bedtime.     [provider]  omeprazole (PRILOSEC) 20 MG capsule Take 1 capsule (20 mg total) by mouth 2 (two) times daily before a meal. 05/31/20   Derrel Nip, Aris Everts, MD  ondansetron (ZOFRAN ODT) 4 MG disintegrating tablet Take 1 tablet (4 mg total) by mouth every 8 (eight) hours as needed for nausea or vomiting. 05/31/20   Crecencio Mc, MD  Red Yeast Rice Extract (RED YEAST RICE PO) Take 1,000 mg by mouth 2 (two) times daily.    [provider]  Sodium Hyaluronate, oral, (HYALURONIC ACID) 100 MG CAPS Take 1 capsule by mouth.    [provider]  telmisartan (MICARDIS) 80 MG tablet TAKE 1 TABLET BY MOUTH EVERY DAY Patient taking differently: Take 80 mg by mouth at bedtime.  05/10/20   Crecencio Mc, MD  vitamin E 400 UNIT capsule Take 400 Units by mouth daily.    [provider]    Allergies No known allergies  Family History  Problem Relation Age of Onset  . Lung cancer Mother   . Arthritis Mother   . Hyperlipidemia Mother   . Hypertension Mother   . Throat cancer Father   . Hyperlipidemia Father   . Cancer Paternal Aunt        breast  . Dementia  Brother   . Leukemia Brother        lymphatic /CLL    Social History Social History   Tobacco Use  . Smoking status: Never Smoker  . Smokeless tobacco: Never Used  Vaping Use  . Vaping Use: Never used  Substance Use Topics  . Alcohol use: Yes    Comment: 1 glass wine week  . Drug use: No      Review of Systems Constitutional: No fever/chills, weakness, lightheadedness Eyes: No visual changes. ENT: No sore throat. Cardiovascular: Denies chest pain. Respiratory: Denies shortness of breath. Gastrointestinal: No abdominal pain.  No nausea, no vomiting.  No diarrhea.  No constipation. Genitourinary: Negative for dysuria. Musculoskeletal: Positive for back pain Skin: Negative for rash. Neurological: Negative for  headaches, focal weakness or numbness. All other ROS negative ____________________________________________   PHYSICAL EXAM:  VITAL SIGNS: ED Triage Vitals  Enc Vitals Group     BP 05/31/20 1734 (!) 85/51     Pulse Rate 05/31/20 1734 92     Resp 05/31/20 1803 18     Temp 05/31/20 1734 98.6 F (37 C)     Temp Source 05/31/20 1734 Oral     SpO2 05/31/20 1734 100 %     Weight 05/31/20 1735 169 lb 12.1 oz (77 kg)     Height 05/31/20 1735 5\' 5"  (1.651 m)     Head Circumference --      Peak Flow --      Pain Score 05/31/20 1735 0     Pain Loc --      Pain Edu? --      Excl. in [place]? --     Constitutional: Alert and oriented. Well appearing and in no acute distress. Eyes: Conjunctivae are normal. EOMI. Head: Atraumatic. Nose: No congestion/rhinnorhea. Mouth/Throat: Mucous membranes are moist.   Neck: No stridor. Trachea Midline. FROM Cardiovascular: Normal rate, regular rhythm. Grossly normal heart sounds.  Good peripheral circulation. Respiratory: Normal respiratory effort.  No retractions. Lungs CTAB. Gastrointestinal: Soft and nontender. No distention. No abdominal bruits.  Musculoskeletal: No lower extremity tenderness nor edema.  No joint  effusions. Neurologic:  Normal speech and language. No gross focal neurologic deficits are appreciated.  Skin:  Skin is warm, dry and intact. No rash noted. Psychiatric: Mood and affect are normal. Speech and behavior are normal. GU: Deferred   ____________________________________________   LABS (all labs ordered are listed, but only abnormal results are displayed)  Labs Reviewed  BASIC METABOLIC PANEL - Abnormal; Notable for the following components:      Result Value   Sodium 128 (*)    Potassium 5.2 (*)    Chloride 93 (*)    Glucose, Bld 111 (*)    BUN 61 (*)    Creatinine, Ser 3.92 (*)    Calcium 11.9 (*)    GFR calc non Af Amer 10 (*)    GFR calc Af Amer 12 (*)    All other components within normal limits  CBC - Abnormal; Notable for the following components:   WBC 88.0 (*)    HCT 35.2 (*)    All other components within normal limits  HEPATIC FUNCTION PANEL - Abnormal; Notable for the following components:   Total Protein 6.3 (*)    Albumin 3.2 (*)    AST 43 (*)    Bilirubin, Direct 0.3 (*)    All other components within normal limits  GLUCOSE, CAPILLARY - Abnormal; Notable for the following components:   Glucose-Capillary 104 (*)    All other components within normal limits  URINALYSIS, COMPLETE (UACMP) WITH MICROSCOPIC  CBG MONITORING, ED   ____________________________________________   ED ECG REPORT I, Vanessa Leander, the attending physician, personally viewed and interpreted this ECG.  Normal sinus rate of 90, no ST elevation, no T wave inversions, normal intervals ____________________________________________  RADIOLOGY   Official radiology report(s): DG Abd 1 View  Result Date: 05/31/2020 CLINICAL DATA:  Lower abdominal pain. EXAM: ABDOMEN - 1 VIEW COMPARISON:  Subsequent abdominal CT, available at time of radiograph interpretation. FINDINGS: No bowel dilatation to suggest obstruction. Small volume of colonic stool. No evidence of free air. No radiopaque  calculi. Right pelvic phlebolith. Aortic atherosclerosis. Scoliosis and degenerative change in the spine. IMPRESSION: Normal bowel gas  pattern. Electronically Signed   By: Keith Rake M.D.   On: 05/31/2020 19:49   CT Renal Stone Study  Result Date: 05/31/2020 CLINICAL DATA:  Flank pain, kidney stone suspected. Worsening kidney function. Weakness for 2 weeks. White blood cell count 75. EXAM: CT ABDOMEN AND PELVIS WITHOUT CONTRAST TECHNIQUE: Multidetector CT imaging of the abdomen and pelvis was performed following the standard protocol without IV contrast. COMPARISON:  Radiograph earlier today. FINDINGS: Lower chest: Anterior lingular opacity abutting the left heart border is only partially included in the field of view. There is additional streaky atelectasis in the lower lobes. Trace left pleural effusion. Small to moderate hiatal hernia. Coronary artery calcifications. There are small epicardial nodes. Hepatobiliary: No evidence of focal hepatic lesion on noncontrast exam. No calcified gallstone or pericholecystic inflammation. No biliary dilatation. Pancreas: No ductal dilatation or inflammation. Fullness in the region the pancreatic head which is adjacent to retroperitoneal fullness/adenopathy. This is not well assessed on noncontrast exam. Spleen: Normal in size. Greatest splenic dimension 12.6 cm. No focal abnormality on noncontrast exam. Adrenals/Urinary Tract: Bilateral adrenal thickening. No hydronephrosis. No renal or ureteral calculi. Urinary bladder is completely empty and not well assessed. Stomach/Bowel: Bowel evaluation is limited in the absence of contrast and presence of intra-abdominal ascites. Small to moderate hiatal hernia. Stomach is decompressed. There is no evidence of small-bowel obstruction. Equivocal wall thickening of the distal and terminal ileum versus nondistention. Normal appendix. Majority of the colon is nondistended and not well assessed. There is no colonic inflammation.  Small volume of stool in the sigmoid colon and rectum. Vascular/Lymphatic: Aortic atherosclerosis. No aortic aneurysm. Multifocal adenopathy throughout the abdomen and pelvis. Majority of the lymph nodes are 10-15 mm. There are enlarged retrocrural, retroperitoneal, mesenteric and omental nodes. Retroperitoneal adenopathy is difficult to delineate from the adjacent vascular structures. Representative left periaortic node at the level of the left kidney measures 14 mm, series 2, image 36. Representative ileocolic node measures 10 mm., series 2, image 55. Central mesenteric node measures 15 mm, series 2, image 48. There is no inguinal adenopathy. There is generalized mesenteric and omental edema and fat stranding. Reproductive: Uterus is grossly normal. Limited adnexal assessment. Other: Generalized mesenteric and omental edema and fat stranding. There is a small volume of abdominopelvic ascites. No free air. Mild subcutaneous edema of the anterior abdominal wall. Musculoskeletal: Multilevel degenerative change throughout the spine. Near complete disc space loss at L1-L2. No focal osseous abnormality. IMPRESSION: 1. No renal stones or obstructive uropathy. 2. Multifocal adenopathy throughout the abdomen and pelvis, including retrocrural, retroperitoneal, mesenteric, and omental enlarged nodes. Majority of these nodes measure 10-15 mm. In the setting of markedly elevated white blood cell count findings are suspicious for lymphoproliferative disorder. Metastatic disease is also considered. 3. Equivocal wall thickening of the distal and terminal ileum versus nondistention. 4. Generalized edema of the mesentery in the anterior omentum. Small volume abdominopelvic ascites. 5. Anterior lingular opacity abutting the left heart border is only partially included in the field of view. This is nonspecific in the setting. Consider completion imaging with chest CT. 6. Small to moderate hiatal hernia. Aortic Atherosclerosis  (ICD10-I70.0). Electronically Signed   By: Keith Rake M.D.   On: 05/31/2020 19:48    ____________________________________________   PROCEDURES  Procedure(s) performed (including Critical Care):  Procedures   ____________________________________________   INITIAL IMPRESSION / ASSESSMENT AND PLAN / ED COURSE  Haley Love was evaluated in Emergency Department on 05/31/2020 for the symptoms described in the history of  present illness. She was evaluated in the context of the global COVID-19 pandemic, which necessitated consideration that the patient might be at risk for infection with the SARS-CoV-2 virus that causes COVID-19. Institutional protocols and algorithms that pertain to the evaluation of patients at risk for COVID-19 are in a state of rapid change based on information released by regulatory bodies including the CDC and federal and state organizations. These policies and algorithms were followed during the patient's care in the ED.    Patient is a 86 year old who comes in with abnormal labs.  Patient's white count is significantly elevated from baseline.  Patient does have a history of CLL.  Will discuss with oncology to see if they want any additional labs to evaluate for blast crisis.  Kidney functions also elevated.  Will get post void to make sure no signs of urinary retention.  CT scan given some left leg pain to make sure no signs of kidney stone or retained mass.  Patient denies any shortness of breath distress PE or chest pain suggest ACS.  Labs consistent with new AKI.  Post void is negative.  CT scan without evidence of kidney stone.  She does have some proliferation of her lymph nodes.  Possibly secondary to her known CLL.  There was concern something on her chest patient does not have any shortness of breath.  I did tell the hospitalist about this so they can get a CT if they would like.  D.w Dr. Janese Banks onc-okay to come to stay here.  Unlikely to have blast crisis  with CLL.  Most likely just some additional lymphoproliferation due to the AKI.  Correct the underlying issue.  Patient was given 1 L of fluids for the AKI.  I discussed the hospital team for admission.  ___________________________________   FINAL CLINICAL IMPRESSION(S) / ED DIAGNOSES   Final diagnoses:  CLL (chronic lymphocytic leukemia) ([place])  AKI (acute kidney injury) ([place])      MEDICATIONS GIVEN DURING THIS VISIT:  Medications  sodium chloride 0.9 % bolus 1,000 mL (0 mLs Intravenous Stopped 05/31/20 2040)     ED Discharge Orders    None       Note:  This document was prepared using Dragon voice recognition software and may include unintentional dictation errors.   Vanessa Barrett, MD 05/31/20 2053

## 2020-05-31 NOTE — ED Notes (Signed)
Date and time results received: 05/31/20 1835 (use smartphrase ".now" to insert current time)  Test: WBC Critical Value: 88.0  Name of Provider Notified:[place]  Orders Received? Or Actions Taken?: NA

## 2020-05-31 NOTE — Telephone Encounter (Signed)
With elevated white count and now with increased creatinine 3.38 (with GFR 12.97) - recommend to ER for evaluation.  ER now.

## 2020-05-31 NOTE — Assessment & Plan Note (Signed)
Changing famotidine to omeprazole bid.

## 2020-05-31 NOTE — Telephone Encounter (Signed)
Spoke with pt and advised her to go to ER per Dr. Nicki Reaper and Dr. Lupita Dawn verbal order.

## 2020-05-31 NOTE — ED Notes (Signed)
Attempted to obtain urine sample. Pt states she does not feel as though she can void at this time. Pt voided when brought to room prior to this RN entering room.

## 2020-05-31 NOTE — Telephone Encounter (Signed)
FYI.  Received critical lab results pt wbc 77.  Reviewed chart.  Pt was in ER wbc 77  - 05/23/20.  Creatinine 1.6.  had Jessica contact lab to see if any other labs available.  Creatine 3.38 with GFR 12.  Advised her to notify pt of need for ER evaluation now.  Per Janett Billow, pt agreeable and is going to ER for evaluation.

## 2020-05-31 NOTE — Patient Instructions (Addendum)
Stop taking hydrchlorthiazide (HCTZ) .  IT IS DEHYDRATING YOU and making your blood pressure too low   Stop the telmisartan if your blood pressure continues to be < 120   continue the metoprolol  STOP THE  famotidine (PEPCID) as it can cause bloating and dry mouth and dizziness   I am prescribing omeprazole for your GERD .  And zofran for the nausea.  You can take it instead of the famotidine twice daily    Increase your water intake or gatorade and rest today   If the x rays confirm constipation ,  You will start the cathartic laxative (lactulose) to get you 'CLEANED OUT"

## 2020-05-31 NOTE — Progress Notes (Addendum)
Subjective:  Patient ID: Haley Love, female    DOB: May 20, 1934  Age: 86 y.o. MRN: [id number]  CC: The primary encounter diagnosis was Stage 3a chronic kidney disease. Diagnoses of T-cell chronic lymphocytic leukemia ([place]), Abdominal bloating, Other constipation, Hypotension due to hypovolemia, Gastroesophageal reflux disease with esophagitis without hemorrhage, Atypical chest pain, and Acute renal failure superimposed on stage 4 chronic kidney disease, unspecified acute renal failure type [facility]) were also pertinent to this visit.  HPI Haley Love presents for EVALUATION AND TREATMENT OF MULTIPLE SYMPTOMS  Patient looks very tired and uncomfortable.  Her dress Is inside out, but she is well groomed and oriented .Marland Kitchen    Cc:  BLOATED,  BACK PAIN,  DRY MOUTH, DIZZY, for one week. Stools have been infrequent ,  Small caliber and liquid at times (small volume).  History:   Treated in ED on June 30 for atypical chest pain.. ruled out for AMI.  Given dx of GERD and dehydration with acute renal failure .Marland Kitchen  Given an IV cocktail,  IV fluids  and rx'd famotidine. Discharged home. Felt better initially,  But started feeling poorly on July 2 and decided to stop all of her supplements including her triple dose of a natural stimulant laxative 2 DAYS AGO.   BACK PAIN STARTED FIRST,  INTERMITTENT on the left side,  But also having lower suprapubic pain without dysuria. HAS BEEN HAVING [facility]EL URGENCY .     ER LABS AND EVALUATION  Reviewed   Outpatient Medications Prior to Visit  Medication Sig Dispense Refill  . allopurinol (ZYLOPRIM) 300 MG tablet Take 1 tablet (300 mg total) by mouth daily. 90 tablet 4  . aspirin 81 MG tablet Take 81 mg by mouth daily.    . B Complex-C (RA B-COMPLEX/VITAMIN C CR) TBCR Take by mouth.    Chong Sicilian, Borago officinalis, (BORAGE OIL PO) Take 1,000 mg by mouth daily.    . Calcium Carbonate-Vit D-Min (RA CALCIUM 600/VIT D/MINERALS) 600-200 MG-UNIT TABS  Take tablets by mouth once a day.    . Cholecalciferol (VITAMIN D-3) 1000 UNITS CAPS Take 1 capsule by mouth at bedtime.    Marland Kitchen CINNAMON PO Take 2,000 mg by mouth at bedtime.    . Coenzyme Q10 (CO Q-10) 200 MG CAPS Take 1 capsule by mouth at bedtime.    . Collagenase POWD by Does not apply route daily at 6 (six) AM.    . Flaxseed, Linseed, (FLAX SEED OIL) 1000 MG CAPS Take by mouth.    Javier Docker Oil 1500 MG CAPS Take by mouth.    . Magnesium 500 MG CAPS Take 1 capsule by mouth daily.     . metoprolol tartrate (LOPRESSOR) 25 MG tablet Take 1 tablet (25 mg total) by mouth 2 (two) times daily. 180 tablet 3  . Milk Thistle 300 MG CAPS Take by mouth daily at 6 (six) AM.    . Misc Natural Products (NARCOSOFT HERBAL LAX PO) Take 2 tablets by mouth at bedtime.     . Red Yeast Rice Extract (RED YEAST RICE PO) Take 1,000 mg by mouth 2 (two) times daily.    . Sodium Hyaluronate, oral, (HYALURONIC ACID) 100 MG CAPS Take 1 capsule by mouth.    . telmisartan (MICARDIS) 80 MG tablet TAKE 1 TABLET BY MOUTH EVERY DAY (Patient taking differently: Take 80 mg by mouth at bedtime. ) 30 tablet 1  . vitamin E 400 UNIT capsule Take 400 Units by mouth daily.    Marland Kitchen  famotidine (PEPCID) 20 MG tablet Take 1 tablet (20 mg total) by mouth 2 (two) times daily. 60 tablet 0  . hydrochlorothiazide (HYDRODIURIL) 25 MG tablet Take 1 tablet (25 mg total) by mouth in the morning. 90 tablet 3   No facility-administered medications prior to visit.    Review of Systems;  Patient denies headache, fevers,, unintentional weight loss, skin rash, eye pain, sinus congestion and sinus pain, sore throat, dysphagia,  hemoptysis , cough, dyspnea, wheezing, chest pain, palpitations, orthopnea, edema,  melena, , flank pain, dysuria, hematuria,  nocturia, numbness, tingling, seizures,  Focal weakness, Loss of consciousness,  Tremor, insomnia, depression, anxiety, and suicidal ideation.      Objective:  BP 120/60 (BP Location: Right Arm, Cuff Size:  Normal)   Pulse 84   Temp 98.2 F (36.8 C)   Ht 5' 5.51" (1.664 m)   Wt 170 lb (77.1 kg)   SpO2 96%   BMI 27.85 kg/m   BP Readings from Last 3 Encounters:  05/31/20 120/60  05/23/20 135/65  05/10/20 129/74    Wt Readings from Last 3 Encounters:  05/31/20 170 lb (77.1 kg)  05/23/20 169 lb 1.5 oz (76.7 kg)  04/26/20 169 lb 3.2 oz (76.7 kg)    General appearance: alert, cooperative and appears tired Ears: normal TM's and external ear canals both ears Throat: lips, mucosa, and tongue normal; teeth and gums normal Neck: no adenopathy, no carotid bruit, supple, symmetrical, trachea midline and thyroid not enlarged, symmetric, no tenderness/mass/nodules Back: symmetric, no curvature. ROM normal. No CVA tenderness. Lungs: clear to auscultation bilaterally Heart: regular rate and rhythm, S1, S2 normal, no murmur, click, rub or gallop Abdomen: distended,  Hypoactive bowel sounds, no masses,  no organomegaly Pulses: 2+ and symmetric Skin: Skin color, texture, turgor normal. No rashes or lesions Lymph nodes: Cervical, supraclavicular, and axillary nodes normal.  Lab Results  Component Value Date   HGBA1C 6.5 05/10/2020   HGBA1C 6.1 02/06/2020   HGBA1C 6.5 08/06/2018    Lab Results  Component Value Date   CREATININE 3.38 (H) 05/31/2020   CREATININE 1.62 (H) 05/23/2020   CREATININE 1.19 (H) 05/17/2020    Lab Results  Component Value Date   WBC 75.5 Repeated and verified X2. (HH) 05/31/2020   HGB 11.9 (L) 05/31/2020   HCT 37.4 05/31/2020   PLT 257.0 05/31/2020   GLUCOSE 102 (H) 05/31/2020   CHOL 193 02/06/2020   TRIG 242.0 (H) 02/06/2020   HDL 36.30 (L) 02/06/2020   LDLDIRECT 122.0 02/06/2020   LDLCALC 109 (H) 02/01/2018   ALT 24 05/31/2020   AST 35 05/31/2020   NA 128 (L) 05/31/2020   K 5.2 (H) 05/31/2020   CL 93 (L) 05/31/2020   CREATININE 3.38 (H) 05/31/2020   BUN 55 (H) 05/31/2020   CO2 27 05/31/2020   TSH 1.44 02/01/2018   INR 1.0 05/31/2014   HGBA1C 6.5  05/10/2020   MICROALBUR 2.5 (H) 02/06/2020    DG Chest 2 View  Result Date: 05/23/2020 CLINICAL DATA:  Chest burning.  Chest pain. EXAM: CHEST - 2 VIEW COMPARISON:  Radiograph 06/23/2013 FINDINGS: The cardiomediastinal contours are normal. The lungs are clear. Aortic atherosclerosis. Pulmonary vasculature is normal. No consolidation, pleural effusion, or pneumothorax. No acute osseous abnormalities are seen. Degenerative change in the thoracic spine. Surgical clips project over the left breast/chest wall. IMPRESSION: No acute chest findings. Electronically Signed   By: Keith Rake M.D.   On: 05/23/2020 04:16    Assessment & Plan:  Problem List Items Addressed This Visit      Unprioritized   CKD (chronic kidney disease) stage 3, GFR 30-59 ml/min - Primary    Renal function was rechecked in June after starting hctz in April and was unchanged at 50 ml/min.  Current decline noted, attributed to dehydration,  But hctz was not stopped during ER visit.  HCTZ discontinued today       Relevant Orders   Comprehensive metabolic panel (Completed)   T-cell chronic lymphocytic leukemia (HCC)    Rechecking WBC given rise of 20K in one week   Lab Results  Component Value Date   WBC 77.6 (HH) 05/23/2020   HGB 12.4 05/23/2020   HCT 37.5 05/23/2020   MCV 92.4 05/23/2020   PLT 237 05/23/2020         Relevant Medications   ondansetron (ZOFRAN ODT) 4 MG disintegrating tablet   Other Relevant Orders   CBC with Differential/Platelet (Completed)   Other constipation   Relevant Orders   DG Abd 1 View   Magnesium (Completed)   Abdominal bloating    History suggestive of worsening of chronic constipation triggered by cessation of natural senna laxative (triple dose)  Plain abd films,  Recheck lytes.  Lactulose if no evidence of SBO      Relevant Orders   DG Abd 1 View   Hypotension    BP was 82/50 by CMA but 120/60 by me during visit.  hctz suspended. ,  Increase water intake.  Checking  lytes and CR       GERD with esophagitis    Changing famotidine to omeprazole bid.       Atypical chest pain    ER records reviewed.  She ruled out for AMI with negative troponins x 2 and was treated for dehydration and GERD esophagitis .      Acute on chronic kidney failure (HCC)    GFR is now < 20 ml/min based on today's labs and she is mildly hyperkalemic in the setting of hypotension and dehydration .  Patient advised to return to ER for treatment and admission          I have discontinued Brandii Lakey. Hlavac's hydrochlorothiazide and famotidine. I am also having her start on omeprazole, Lactulose, and ondansetron. Additionally, I am having her maintain her aspirin, CINNAMON PO, Misc Natural Products (NARCOSOFT HERBAL LAX PO), Co Q-10, Vitamin D-3, Flax Seed Oil, vitamin E, Krill Oil, Red Yeast Rice Extract (RED YEAST RICE PO), Magnesium, (Borage, Borago officinalis, (BORAGE OIL PO)), RA B-Complex/Vitamin C CR, RA Calcium 600/Vit D/Minerals, Milk Thistle, Collagenase, allopurinol, metoprolol tartrate, telmisartan, and Hyaluronic Acid.  Meds ordered this encounter  Medications  . omeprazole (PRILOSEC) 20 MG capsule    Sig: Take 1 capsule (20 mg total) by mouth 2 (two) times daily before a meal.    Dispense:  60 capsule    Refill:  3  . Lactulose 20 GM/30ML SOLN    Sig: 30 ml every 4 hours until constipation is relieved    Dispense:  236 mL    Refill:  3  . ondansetron (ZOFRAN ODT) 4 MG disintegrating tablet    Sig: Take 1 tablet (4 mg total) by mouth every 8 (eight) hours as needed for nausea or vomiting.    Dispense:  20 tablet    Refill:  0    Medications Discontinued During This Encounter  Medication Reason  . hydrochlorothiazide (HYDRODIURIL) 25 MG tablet   . famotidine (PEPCID) 20 MG tablet  A total of 40 minutes was spent with patient more than half of which was spent in counseling patient on the above mentioned issues , reviewing and explaining recent labs and imaging  studies done, and coordination of care. Follow-up: No follow-ups on file.   Crecencio Mc, MD

## 2020-05-31 NOTE — Telephone Encounter (Signed)
Thanks for going the extra mile.  I agree with your advice.

## 2020-05-31 NOTE — Telephone Encounter (Signed)
CRITICAL VALUE STICKER  CRITICAL VALUE: White blood count 77.5  RECEIVER (on-site recipient of call): Haley Love  DATE & TIME NOTIFIED: 05/31/20; 4:10  MESSENGER (representative from lab): Hope  MD NOTIFIED: Deborra Medina MD  TIME OF NOTIFICATION: 4:05  RESPONSE:

## 2020-05-31 NOTE — ED Triage Notes (Addendum)
Patient reports worsening weakness for the last two weeks. States she was seen by Dr. Derrel Nip and had labs drawn today. States she received a call stating to come to ED immediately due to abnormal blood work. Patient WBC 75, creat 3.38, BUN 55  Patient complaining of being weak and lightheaded. Denies any pain. States she has had intermittent back pain for the last week. Hypotensive in triage. States her BP meds were recently increased.

## 2020-05-31 NOTE — ED Notes (Signed)
Lab called to add on lab work 

## 2020-06-01 ENCOUNTER — Other Ambulatory Visit: Payer: Self-pay | Admitting: Oncology

## 2020-06-01 ENCOUNTER — Telehealth: Payer: Self-pay | Admitting: Oncology

## 2020-06-01 DIAGNOSIS — C91Z0 Other lymphoid leukemia not having achieved remission: Secondary | ICD-10-CM

## 2020-06-01 LAB — CBC WITH DIFFERENTIAL/PLATELET
Abs Immature Granulocytes: 0 10*3/uL (ref 0.00–0.07)
Basophils Absolute: 0 10*3/uL (ref 0.0–0.1)
Basophils Relative: 0 %
Blasts: 44 %
Eosinophils Absolute: 0 10*3/uL (ref 0.0–0.5)
Eosinophils Relative: 0 %
HCT: 35.1 % — ABNORMAL LOW (ref 36.0–46.0)
Hemoglobin: 12.3 g/dL (ref 12.0–15.0)
Lymphocytes Relative: 33 %
Lymphs Abs: 28.7 10*3/uL — ABNORMAL HIGH (ref 0.7–4.0)
MCH: 31.3 pg (ref 26.0–34.0)
MCHC: 35 g/dL (ref 30.0–36.0)
MCV: 89.3 fL (ref 80.0–100.0)
Monocytes Absolute: 8.7 10*3/uL — ABNORMAL HIGH (ref 0.1–1.0)
Monocytes Relative: 10 %
Neutro Abs: 11.3 10*3/uL — ABNORMAL HIGH (ref 1.7–7.7)
Neutrophils Relative %: 13 %
Platelets: 271 10*3/uL (ref 150–400)
RBC: 3.93 MIL/uL (ref 3.87–5.11)
RDW: 13.3 % (ref 11.5–15.5)
Smear Review: NORMAL
WBC: 86.9 10*3/uL (ref 4.0–10.5)
nRBC: 0 % (ref 0.0–0.2)

## 2020-06-01 LAB — URINALYSIS, COMPLETE (UACMP) WITH MICROSCOPIC
Bacteria, UA: NONE SEEN
Bilirubin Urine: NEGATIVE
Glucose, UA: NEGATIVE mg/dL
Hgb urine dipstick: NEGATIVE
Ketones, ur: NEGATIVE mg/dL
Nitrite: NEGATIVE
Protein, ur: NEGATIVE mg/dL
Specific Gravity, Urine: 1.013 (ref 1.005–1.030)
WBC, UA: >50 WBC/hpf — ABNORMAL HIGH (ref 0–5)
pH: 5 (ref 5.0–8.0)

## 2020-06-01 LAB — BASIC METABOLIC PANEL
Anion gap: 10 (ref 5–15)
Anion gap: 5 (ref 5–15)
BUN: 64 mg/dL — ABNORMAL HIGH (ref 8–23)
BUN: 60 mg/dL — ABNORMAL HIGH (ref 8–23)
CO2: 21 mmol/L — ABNORMAL LOW (ref 22–32)
CO2: 23 mmol/L (ref 22–32)
Calcium: 10.9 mg/dL — ABNORMAL HIGH (ref 8.9–10.3)
Calcium: 10.9 mg/dL — ABNORMAL HIGH (ref 8.9–10.3)
Chloride: 99 mmol/L (ref 98–111)
Chloride: 99 mmol/L (ref 98–111)
Creatinine, Ser: 3.53 mg/dL — ABNORMAL HIGH (ref 0.44–1.00)
Creatinine, Ser: 2.85 mg/dL — ABNORMAL HIGH (ref 0.44–1.00)
GFR calc Af Amer: 13 mL/min — ABNORMAL LOW (ref >60)
GFR calc Af Amer: 17 mL/min — ABNORMAL LOW (ref >60)
GFR calc non Af Amer: 11 mL/min — ABNORMAL LOW (ref >60)
GFR calc non Af Amer: 15 mL/min — ABNORMAL LOW (ref >60)
Glucose, Bld: 72 mg/dL (ref 70–99)
Glucose, Bld: 78 mg/dL (ref 70–99)
Potassium: 5.4 mmol/L — ABNORMAL HIGH (ref 3.5–5.1)
Potassium: 5.5 mmol/L — ABNORMAL HIGH (ref 3.5–5.1)
Sodium: 130 mmol/L — ABNORMAL LOW (ref 135–145)
Sodium: 127 mmol/L — ABNORMAL LOW (ref 135–145)

## 2020-06-01 LAB — CBC
HCT: 29.7 % — ABNORMAL LOW (ref 36.0–46.0)
Hemoglobin: 10.2 g/dL — ABNORMAL LOW (ref 12.0–15.0)
MCH: 30.6 pg (ref 26.0–34.0)
MCHC: 34.3 g/dL (ref 30.0–36.0)
MCV: 89.2 fL (ref 80.0–100.0)
Platelets: 216 10*3/uL (ref 150–400)
RBC: 3.33 MIL/uL — ABNORMAL LOW (ref 3.87–5.11)
RDW: 13.3 % (ref 11.5–15.5)
WBC: 91.7 10*3/uL (ref 4.0–10.5)
nRBC: 0 % (ref 0.0–0.2)

## 2020-06-01 LAB — URIC ACID: Uric Acid, Serum: 9.8 mg/dL — ABNORMAL HIGH (ref 2.5–7.1)

## 2020-06-01 LAB — LACTATE DEHYDROGENASE: LDH: 1247 U/L — ABNORMAL HIGH (ref 98–192)

## 2020-06-01 LAB — PATHOLOGIST SMEAR REVIEW

## 2020-06-01 LAB — PHOSPHORUS: Phosphorus: 3.5 mg/dL (ref 2.5–4.6)

## 2020-06-01 MED ORDER — ALLOPURINOL 100 MG PO TABS
50.0000 mg | ORAL_TABLET | Freq: Once | ORAL | Status: AC
  Administered 2020-06-01: 50 mg via ORAL
  Filled 2020-06-01: qty 1

## 2020-06-01 MED ORDER — POLYETHYLENE GLYCOL 3350 17 G PO PACK: 17.0000 g | PACK | Freq: Every day | ORAL | Status: DC

## 2020-06-01 MED ORDER — ASPIRIN EC 81 MG PO TBEC
81.0000 mg | DELAYED_RELEASE_TABLET | Freq: Every day | ORAL | Status: DC
  Administered 2020-06-01 – 2020-06-03 (×2): 81 mg via ORAL
  Filled 2020-06-01 (×4): qty 1

## 2020-06-01 NOTE — Consult Note (Signed)
[address] [place], [place] [address] Phone [phone]. Fax [phone]  Date: 06/01/2020                  Patient Name:  Haley Love  MRN: [id number]  DOB: 02/17/1934  Age / Sex: 86 y.o., female         PCP: Crecencio Mc, MD                 Service Requesting Consult: IM/ Enzo Bi, MD                 Reason for Consult: ARF            History of Present Illness: Patient is a 87 y.o. female with medical problems of CLL, who was admitted to [facility] on 05/31/2020 for evaluation of abdominal distention and worsening CLL  Patient has an aggressive form of CLL.  She reports that she feels like she has bloated abdomen for the past 2 weeks.  She has been feeling terrible with decreased energy levels.  She feels dehydrated.  She has been nauseous but no vomiting.  Today she had 2 bowel movements.  She reports some confusion about her blood pressure medications are outpatient and her blood pressure has been fluctuating No acute shortness of breath No leg edema Baseline creatinine of 1.05 from June 17 Creatinine has increased to 3.92 on May 31, 2020 LDH is elevated along with calcium and uric acid.  There is concern of tumor lysis syndrome  Medications: Outpatient medications: Medications Prior to Admission  Medication Sig Dispense Refill Last Dose   B Complex-C (RA B-COMPLEX/VITAMIN C CR) TBCR Take by mouth.   Past Week at Unknown time   Borage, Borago officinalis, (BORAGE OIL PO) Take 1,000 mg by mouth daily.   Past Week at Unknown time   Calcium Carbonate-Vit D-Min (RA CALCIUM 600/VIT D/MINERALS) 600-200 MG-UNIT TABS Take tablets by mouth once a day.   Past Week at Unknown time   Cholecalciferol (VITAMIN D-3) 1000 UNITS CAPS Take 1 capsule by mouth at bedtime.   Past Week at Unknown time   CINNAMON PO Take 2,000 mg by mouth at bedtime.   Past Week at Unknown time   Coenzyme Q10 (CO Q-10) 200 MG CAPS Take 1 capsule by mouth at bedtime.   Past Week at  Unknown time   Collagenase POWD by Does not apply route daily at 6 (six) AM.   Past Week at Unknown time   Flaxseed, Linseed, (FLAX SEED OIL) 1000 MG CAPS Take by mouth.   Past Week at Unknown time   Krill Oil 1500 MG CAPS Take by mouth.   Past Week at Unknown time   Lactulose 20 GM/30ML SOLN 30 ml every 4 hours until constipation is relieved 236 mL 3 Past Week at Unknown time   Magnesium 500 MG CAPS Take 1 capsule by mouth daily.    Past Week at Unknown time   Milk Thistle 300 MG CAPS Take by mouth daily at 6 (six) AM.   Past Week at Unknown time   Misc Natural Products (NARCOSOFT HERBAL LAX PO) Take 2 tablets by mouth at bedtime.    Past Week at Unknown time   Red Yeast Rice Extract (RED YEAST RICE PO) Take 1,000 mg by mouth 2 (two) times daily.   Past Week at Unknown time   Sodium Hyaluronate, oral, (HYALURONIC ACID) 100 MG CAPS Take 1 capsule by mouth.   Past Week at Unknown time  vitamin E 400 UNIT capsule Take 400 Units by mouth daily.   Past Week at Unknown time   allopurinol (ZYLOPRIM) 300 MG tablet Take 1 tablet (300 mg total) by mouth daily. 90 tablet 4 05/29/2020 at 0900   aspirin 81 MG tablet Take 81 mg by mouth daily.   05/29/2020 at 0900   metoprolol tartrate (LOPRESSOR) 25 MG tablet Take 1 tablet (25 mg total) by mouth 2 (two) times daily. 180 tablet 3 05/29/2020 at 0900   omeprazole (PRILOSEC) 20 MG capsule Take 1 capsule (20 mg total) by mouth 2 (two) times daily before a meal. 60 capsule 3    ondansetron (ZOFRAN ODT) 4 MG disintegrating tablet Take 1 tablet (4 mg total) by mouth every 8 (eight) hours as needed for nausea or vomiting. 20 tablet 0    telmisartan (MICARDIS) 80 MG tablet TAKE 1 TABLET BY MOUTH EVERY DAY (Patient taking differently: Take 80 mg by mouth at bedtime. ) 30 tablet 1 05/29/2020 at 0900    Current medications: Current Facility-Administered Medications  Medication Dose Route Frequency Provider Last Rate Last Admin   0.9 %  sodium chloride  infusion   Intravenous Continuous Enzo Bi, MD 125 mL/hr at 06/01/20 1544 Rate Change at 06/01/20 1544   acetaminophen (TYLENOL) tablet 650 mg  650 mg Oral Q6H PRN Nicolette Bang, DO       Or   acetaminophen (TYLENOL) suppository 650 mg  650 mg Rectal Q6H PRN Nicolette Bang, DO       aspirin EC tablet 81 mg  81 mg Oral Daily Enzo Bi, MD   81 mg at 06/01/20 1311   heparin injection 5,000 Units  5,000 Units Subcutaneous Q8H Nicolette Bang, DO   5,000 Units at 06/01/20 1311   metoprolol tartrate (LOPRESSOR) tablet 25 mg  25 mg Oral BID Nicolette Bang, DO   25 mg at 06/01/20 0924   pantoprazole (PROTONIX) EC tablet 40 mg  40 mg Oral Daily Nicolette Bang, DO   40 mg at 06/01/20 4431      Allergies: Allergies  Allergen Reactions   No Known Allergies       Past Medical History: Past Medical History:  Diagnosis Date   Breast cancer ([place])    Cataract    Chickenpox    Contact lens/glasses fitting    Diabetes mellitus without complication ([place])    Hyperlipidemia    Hypertension    Inflammatory polyps of colon [facility])      Past Surgical History: Past Surgical History:  Procedure Laterality Date   BREAST LUMPECTOMY Left 07/28/2012   BREAST SURGERY  9/13   lt lump   cataracts  2014   COLONOSCOPY WITH PROPOFOL N/A 01/05/2017   Procedure: COLONOSCOPY WITH PROPOFOL;  Surgeon: Manya Silvas, MD;  Location: [place] [facility];  Service: Endoscopy;  Laterality: N/A;   JOINT REPLACEMENT Right July 2015   [place], right knee   KNEE ARTHROSCOPY W/ MENISCAL REPAIR  09/23/11   Right   MASTECTOMY, PARTIAL  08/06/2012   Procedure: MASTECTOMY PARTIAL;  Surgeon: Adin Hector, MD;  Location: [facility];  Service: General;  Laterality: Left;  Left partial mastectomy with re-excision of margins     Family History: Family History  Problem Relation Age of Onset   Lung cancer Mother    Arthritis Mother     Hyperlipidemia Mother    Hypertension Mother    Throat cancer Father    Hyperlipidemia Father  Cancer Paternal Aunt        breast   Dementia Brother    Leukemia Brother        lymphatic /CLL     Social History: Social History   Socioeconomic History   Marital status: Divorced    Spouse name: Not on file   Number of children: Not on file   Years of education: Not on file   Highest education level: Not on file  Occupational History   Not on file  Tobacco Use   Smoking status: Never Smoker   Smokeless tobacco: Never Used  Vaping Use   Vaping Use: Never used  Substance and Sexual Activity   Alcohol use: Yes    Comment: 1 glass wine week   Drug use: No   Sexual activity: Never    Birth control/protection: Post-menopausal  Other Topics Concern   Not on file  Social History Narrative   Lives alone ,     Social Determinants of Health   Financial Resource Strain:    Difficulty of Paying Living Expenses:   Food Insecurity:    Worried About Charity fundraiser in the Last Year:    Arboriculturist in the Last Year:   Transportation Needs:    Film/video editor (Medical):    Lack of Transportation (Non-Medical):   Physical Activity:    Days of Exercise per Week:    Minutes of Exercise per Session:   Stress:    Feeling of Stress :   Social Connections:    Frequency of Communication with Friends and Family:    Frequency of Social Gatherings with Friends and Family:    Attends Religious Services:    Active Member of Clubs or Organizations:    Attends Music therapist:    Marital Status:   Intimate Partner Violence:    Fear of Current or Ex-Partner:    Emotionally Abused:    Physically Abused:    Sexually Abused:      Review of Systems: Gen: Feeling poorly, no fevers or chills HEENT: No vision or hearing complaints CV: No chest pain or shortness of breath Resp: No cough or sputum production GI:  Appetite has been poor GU : No problems with voiding, no hematuria MS: No acute joint pain.  Chronic back pain Derm:    No complaints Psych: No complaints Heme: No complaints Neuro: No complaints Endocrine.  No complaints  Vital Signs: Blood pressure (!) 138/52, pulse 72, temperature 97.7 F (36.5 C), temperature source Oral, resp. rate 18, height 5\' 5"  (1.651 m), weight 77 kg, SpO2 97 %.   Intake/Output Summary (Last 24 hours) at 06/01/2020 1739 Last data filed at 06/01/2020 1500 Gross per 24 hour  Intake 3217.91 ml  Output 700 ml  Net 2517.91 ml    Weight trends: [employer]   05/31/20 1735  Weight: 77 kg    Physical Exam: General:  Frail elderly woman, lying in the bed  HEENT  dry oral mucous membranes  Lungs:  Normal breathing effort, decreased breath sounds at bases no crackles  Heart::  Regular, no rub  Abdomen:  Soft, nontender  Extremities:  No peripheral edema  Neurologic:  Alert, oriented, able to answer questions  Skin:  No acute rashes    Lab results: Basic Metabolic Panel: Recent Labs  Lab 05/31/20 1245 05/31/20 1804 06/01/20 0623 06/01/20 1141  NA 128* 128* 130*  --   K 5.2* 5.2* 5.4*  --   CL 93*  93* 99  --   CO2 27 22 21*  --   GLUCOSE 102* 111* 72  --   BUN 55* 61* 64*  --   CREATININE 3.38* 3.92* 3.53*  --   CALCIUM 12.2* 11.9* 10.9*  --   MG 1.7  --   --   --   PHOS  --   --   --  3.5    Liver Function Tests: Recent Labs  Lab 05/31/20 1810  AST 43*  ALT 28  ALKPHOS 75  BILITOT 1.1  PROT 6.3*  ALBUMIN 3.2*   Recent Labs  Lab 05/31/20 1804  LIPASE 40   No results for input(s): AMMONIA in the last 168 hours.  CBC: Recent Labs  Lab 05/31/20 1245 05/31/20 1245 05/31/20 1804 06/01/20 0623  WBC 75.5 Repeated and verified X2.*   < > 86.9*   88.0* 91.7*  NEUTROABS 11.8*  --  11.3*  --   HGB 11.9*   < > 12.3   12.2 10.2*  HCT 37.4   < > 35.1*   35.2* 29.7*  MCV 94.6   < > 89.3   88.0 89.2  PLT 257.0   < > 271   269 216    < > = values in this interval not displayed.    Cardiac Enzymes: No results for input(s): CKTOTAL, TROPONINI in the last 168 hours.  BNP: Invalid input(s): POCBNP  CBG: Recent Labs  Lab 05/31/20 1819  GLUCAP 104*    Microbiology: Recent Results (from the past 720 hour(s))  SARS Coronavirus 2 by RT PCR (hospital order, performed in [facility] hospital lab) Nasopharyngeal Nasopharyngeal Swab     Status: None   Collection Time: 05/31/20  8:42 PM   Specimen: Nasopharyngeal Swab  Result Value Ref Range Status   SARS Coronavirus 2 NEGATIVE NEGATIVE Final    Comment: (NOTE) SARS-CoV-2 target nucleic acids are NOT DETECTED.  The SARS-CoV-2 RNA is generally detectable in upper and lower respiratory specimens during the acute phase of infection. The lowest concentration of SARS-CoV-2 viral copies this assay can detect is 250 copies / mL. A negative result does not preclude SARS-CoV-2 infection and should not be used as the sole basis for treatment or other patient management decisions.  A negative result may occur with improper specimen collection / handling, submission of specimen other than nasopharyngeal swab, presence of viral mutation(s) within the areas targeted by this assay, and inadequate number of viral copies (<250 copies / mL). A negative result must be combined with clinical observations, patient history, and epidemiological information.  Fact Sheet for Patients:   StrictlyIdeas.no  Fact Sheet for Healthcare Providers: BankingDealers.co.za  This test is not yet approved or  cleared by the [place] FDA and has been authorized for detection and/or diagnosis of SARS-CoV-2 by FDA under an Emergency Use Authorization (EUA).  This EUA will remain in effect (meaning this test can be used) for the duration of the COVID-19 declaration under Section 564(b)(1) of the Act, 21 U.S.C. section 360bbb-3(b)(1), unless the  authorization is terminated or revoked sooner.  Performed at [facility], [address] [place], [place] [address]      Coagulation Studies: No results for input(s): LABPROT, INR in the last 72 hours.  Urinalysis: Recent Labs    06/01/20 0126  COLORURINE YELLOW*  LABSPEC 1.013  PHURINE 5.0  GLUCOSEU NEGATIVE  HGBUR NEGATIVE  BILIRUBINUR NEGATIVE  KETONESUR NEGATIVE  PROTEINUR NEGATIVE  NITRITE NEGATIVE  LEUKOCYTESUR MODERATE*  Imaging: DG Chest 2 View  Result Date: 05/31/2020 CLINICAL DATA:  Abnormal CT EXAM: CHEST - 2 VIEW COMPARISON:  CT 05/31/2020, chest x-ray 05/23/2020 FINDINGS: Tiny pleural effusions on lateral view. No consolidation or effusion. Normal heart size. No pneumothorax. Aortic atherosclerosis. IMPRESSION: Tiny pleural effusion on lateral view. Electronically Signed   By: Donavan Foil M.D.   On: 05/31/2020 21:56   DG Abd 1 View  Result Date: 05/31/2020 CLINICAL DATA:  Lower abdominal pain. EXAM: ABDOMEN - 1 VIEW COMPARISON:  Subsequent abdominal CT, available at time of radiograph interpretation. FINDINGS: No bowel dilatation to suggest obstruction. Small volume of colonic stool. No evidence of free air. No radiopaque calculi. Right pelvic phlebolith. Aortic atherosclerosis. Scoliosis and degenerative change in the spine. IMPRESSION: Normal bowel gas pattern. Electronically Signed   By: Keith Rake M.D.   On: 05/31/2020 19:49   CT Renal Stone Study  Result Date: 05/31/2020 CLINICAL DATA:  Flank pain, kidney stone suspected. Worsening kidney function. Weakness for 2 weeks. White blood cell count 75. EXAM: CT ABDOMEN AND PELVIS WITHOUT CONTRAST TECHNIQUE: Multidetector CT imaging of the abdomen and pelvis was performed following the standard protocol without IV contrast. COMPARISON:  Radiograph earlier today. FINDINGS: Lower chest: Anterior lingular opacity abutting the left heart border is only partially included in the field of view.  There is additional streaky atelectasis in the lower lobes. Trace left pleural effusion. Small to moderate hiatal hernia. Coronary artery calcifications. There are small epicardial nodes. Hepatobiliary: No evidence of focal hepatic lesion on noncontrast exam. No calcified gallstone or pericholecystic inflammation. No biliary dilatation. Pancreas: No ductal dilatation or inflammation. Fullness in the region the pancreatic head which is adjacent to retroperitoneal fullness/adenopathy. This is not well assessed on noncontrast exam. Spleen: Normal in size. Greatest splenic dimension 12.6 cm. No focal abnormality on noncontrast exam. Adrenals/Urinary Tract: Bilateral adrenal thickening. No hydronephrosis. No renal or ureteral calculi. Urinary bladder is completely empty and not well assessed. Stomach/Bowel: Bowel evaluation is limited in the absence of contrast and presence of intra-abdominal ascites. Small to moderate hiatal hernia. Stomach is decompressed. There is no evidence of small-bowel obstruction. Equivocal wall thickening of the distal and terminal ileum versus nondistention. Normal appendix. Majority of the colon is nondistended and not well assessed. There is no colonic inflammation. Small volume of stool in the sigmoid colon and rectum. Vascular/Lymphatic: Aortic atherosclerosis. No aortic aneurysm. Multifocal adenopathy throughout the abdomen and pelvis. Majority of the lymph nodes are 10-15 mm. There are enlarged retrocrural, retroperitoneal, mesenteric and omental nodes. Retroperitoneal adenopathy is difficult to delineate from the adjacent vascular structures. Representative left periaortic node at the level of the left kidney measures 14 mm, series 2, image 36. Representative ileocolic node measures 10 mm., series 2, image 55. Central mesenteric node measures 15 mm, series 2, image 48. There is no inguinal adenopathy. There is generalized mesenteric and omental edema and fat stranding. Reproductive:  Uterus is grossly normal. Limited adnexal assessment. Other: Generalized mesenteric and omental edema and fat stranding. There is a small volume of abdominopelvic ascites. No free air. Mild subcutaneous edema of the anterior abdominal wall. Musculoskeletal: Multilevel degenerative change throughout the spine. Near complete disc space loss at L1-L2. No focal osseous abnormality. IMPRESSION: 1. No renal stones or obstructive uropathy. 2. Multifocal adenopathy throughout the abdomen and pelvis, including retrocrural, retroperitoneal, mesenteric, and omental enlarged nodes. Majority of these nodes measure 10-15 mm. In the setting of markedly elevated white blood cell count findings are suspicious for lymphoproliferative  disorder. Metastatic disease is also considered. 3. Equivocal wall thickening of the distal and terminal ileum versus nondistention. 4. Generalized edema of the mesentery in the anterior omentum. Small volume abdominopelvic ascites. 5. Anterior lingular opacity abutting the left heart border is only partially included in the field of view. This is nonspecific in the setting. Consider completion imaging with chest CT. 6. Small to moderate hiatal hernia. Aortic Atherosclerosis (ICD10-I70.0). Electronically Signed   By: Keith Rake M.D.   On: 05/31/2020 19:48      Assessment & Plan: Pt is a 86 y.o.   female with  Aggressive chronic lymphocytic leukemia Diabetes Hypertension Hyperlipidemia  was admitted on 05/31/2020 with CLL (chronic lymphocytic leukemia) ([facility]) [C91.10] Abnormal findings on diagnostic imaging of abdomen [R93.5] AKI (acute kidney injury) ([place]) [N17.9]  1.  Acute kidney injury 2.  Aggressive CLL  3.  Concern about tumor lysis syndrome with hyper uricemia, hyperkalemia  Acute kidney injury is likely multifactorial with contribution from volume shifts, dehydration, contribution from telmisartan and volume depleted state  She has mild elevation of potassium, BUN, uric  acid and LDH.  However phosphorus level is normal.  There is concern about low-grade tumor lysis syndrome.  Plan: Agree with IV hydration with normal saline Agree with allopurinol.  Can be given upto 100 mg/day [place] to manage hyperkalemia Supportive care for now Electrolytes and volume status are acceptable.  No acute indication for dialysis at present.    LOS: 1 [place] 7/9/20215:39 PM    Note: This note was prepared with Dragon dictation. Any transcription errors are unintentional

## 2020-06-01 NOTE — Progress Notes (Deleted)
I was contacted this afternoon by my partner Dr. Janese Banks in [place] to let me know Ms. Haley Love had been admitted there for what initially appeared to be unrelated problems to her T-cell leukemia issue.  Dr. Janese Banks suggested I follow-up a little bit sooner than planned and I scheduled Ms. Haley Love to see me next Friday.  On further evaluation Dr. Janese Banks feels the leukemia is actually the reason for the patient's acute problems leading to admission.  From her comments and concerns it appears tumor lysis syndrome may be ongoing.  My recommendation was that the patient be stabilized and transferred to [facility] or [facility] for definitive treatment if the patient desires treatment.

## 2020-06-01 NOTE — Telephone Encounter (Signed)
Scheduled apt per 7/9 sch msg - left message with appt date and time , unable to reach pt . ;

## 2020-06-01 NOTE — Progress Notes (Signed)
PROGRESS NOTE    Haley Love  XTK:[id number] DOB: Mar 21, 1934 DOA: 05/31/2020 PCP: Crecencio Mc, MD    Assessment & Plan:   Active Problems:   Hyperlipidemia with target LDL less than 100   Leukocytosis   Essential hypertension   Diabetes mellitus type 2, diet-controlled (HCC)   T-cell chronic lymphocytic leukemia ([place])   AKI (acute kidney injury) ([place])    Haley Love is 86 y.o. female with CLL, DM, HTN, HLD who presents to ED per PCP recommendation for Cr 3.38 and WBC 75. Patient endorsing intermittent generalized weakness for the past week.    AKI Cr 3.92. BUN 61. GFR 10. K 5.2. Baseline Cr appears to be around 1.1. At ED visit on 6/30, had uptrended to 1.6. CT renal study negative for stones, hydronephrosis, urinary obstruction. Bladder scan did not show increased PVR. Suspect related to dehydration from poor PO intake along with use of thiazide diuretic and ARB.  Denies NSAID use. Given 1L IVF bolus in ED. Electrolytes are stable. While nausea and anorexia may be signs of uremia, patient does not have any symptoms consistent with severe uremia that would warrant more urgent intervention.  PLAN: -continue IVFs at 125 cc/hr  --consult nephrology today  Concern for tumor lysis syndrome Leukocytosis with history of T cell CLL  Recent WBC trend 54>75>88. CT renal study showed multifocal adenopathy which is most likely consistent with lymphoproliferative disorder. ED provider discussed case with Oncology who stated unlikely to have blast crisis with CLL.  --LDH increased to 1247 from 436 2 weeks ago, and K elevated, concerning for tumor lysis syndrome. PLAN: --follow up oncology rec --continue MIVF@125  --consult nephrology --BMP BID  HTN  Normotensive to slightly elevated.  -continue Metoprolol  -hold Telmisartan due to AKI   T2DM Diet controlled. Last A1c 6.5% on 05/10/20.  -monitor glucose on AM labs  -add SSI if needed    DVT prophylaxis: Heparin  SQ Code Status: Full code  Family Communication: daughter updated at bedside today Status is: inpatient Dispo:   The patient is from: home Anticipated d/c is to: home Anticipated d/c date is: unclear Patient currently is not medically stable to d/c due to: AKI with concern for tumor lysis, on aggressive IVF.   Subjective and Interval History:  Pt complained of feeling weak and unwell, and abdominal distention.  No dysuria.     Objective: Vitals:   06/01/20 0619 06/01/20 0745 06/01/20 0900 06/01/20 1220  BP: (!) 125/45 (!) 140/45 (!) 131/40 (!) 138/52  Pulse: 88 86 86 72  Resp: 18 18  18   Temp: 98.6 F (37 C) 97.9 F (36.6 C) 98.1 F (36.7 C) 97.7 F (36.5 C)  TempSrc: Oral Oral Oral Oral  SpO2: 96% 95% 96% 97%  Weight:      Height:        Intake/Output Summary (Last 24 hours) at 06/01/2020 1545 Last data filed at 06/01/2020 1500 Gross per 24 hour  Intake 3217.91 ml  Output 700 ml  Net 2517.91 ml   Filed Weights   05/31/20 1735  Weight: 77 kg    Examination:   Constitutional: NAD, AAOx3 HEENT: conjunctivae and lids normal, EOMI CV: RRR no M,R,G. Distal pulses +2.  No cyanosis.   RESP: CTA B/L, normal respiratory effort  GI: +BS, large, distended, but soft Extremities: No effusions, edema, or tenderness in BLE SKIN: warm, dry and intact Neuro: II - XII grossly intact.  Sensation intact Psych: Depressed mood and affect.  Data Reviewed: I have personally reviewed following labs and imaging studies  CBC: Recent Labs  Lab 05/31/20 1245 05/31/20 1804 06/01/20 0623  WBC 75.5 Repeated and verified X2.* 86.9*   88.0* 91.7*  NEUTROABS 11.8* 11.3*  --   HGB 11.9* 12.3   12.2 10.2*  HCT 37.4 35.1*   35.2* 29.7*  MCV 94.6 89.3   88.0 89.2  PLT 257.0 271   269 277   Basic Metabolic Panel: Recent Labs  Lab 05/31/20 1245 05/31/20 1804 06/01/20 0623 06/01/20 1141  NA 128* 128* 130*  --   K 5.2* 5.2* 5.4*  --   CL 93* 93* 99  --   CO2 27 22 21*  --     GLUCOSE 102* 111* 72  --   BUN 55* 61* 64*  --   CREATININE 3.38* 3.92* 3.53*  --   CALCIUM 12.2* 11.9* 10.9*  --   MG 1.7  --   --   --   PHOS  --   --   --  3.5   GFR: Estimated Creatinine Clearance: 12.4 mL/min (A) (by C-G formula based on SCr of 3.53 mg/dL (H)). Liver Function Tests: Recent Labs  Lab 05/31/20 1245 05/31/20 1810  AST 35 43*  ALT 24 28  ALKPHOS 79 75  BILITOT 0.8 1.1  PROT 6.0 6.3*  ALBUMIN 3.6 3.2*   Recent Labs  Lab 05/31/20 1804  LIPASE 40   No results for input(s): AMMONIA in the last 168 hours. Coagulation Profile: No results for input(s): INR, PROTIME in the last 168 hours. Cardiac Enzymes: No results for input(s): CKTOTAL, CKMB, CKMBINDEX, TROPONINI in the last 168 hours. BNP (last 3 results) No results for input(s): PROBNP in the last 8760 hours. HbA1C: No results for input(s): HGBA1C in the last 72 hours. CBG: Recent Labs  Lab 05/31/20 1819  GLUCAP 104*   Lipid Profile: No results for input(s): CHOL, HDL, LDLCALC, TRIG, CHOLHDL, LDLDIRECT in the last 72 hours. Thyroid Function Tests: No results for input(s): TSH, T4TOTAL, FREET4, T3FREE, THYROIDAB in the last 72 hours. Anemia Panel: No results for input(s): VITAMINB12, FOLATE, FERRITIN, TIBC, IRON, RETICCTPCT in the last 72 hours. Sepsis Labs: No results for input(s): PROCALCITON, LATICACIDVEN in the last 168 hours.  Recent Results (from the past 240 hour(s))  SARS Coronavirus 2 by RT PCR (hospital order, performed in [facility] hospital lab) Nasopharyngeal Nasopharyngeal Swab     Status: None   Collection Time: 05/31/20  8:42 PM   Specimen: Nasopharyngeal Swab  Result Value Ref Range Status   SARS Coronavirus 2 NEGATIVE NEGATIVE Final    Comment: (NOTE) SARS-CoV-2 target nucleic acids are NOT DETECTED.  The SARS-CoV-2 RNA is generally detectable in upper and lower respiratory specimens during the acute phase of infection. The lowest concentration of SARS-CoV-2 viral copies  this assay can detect is 250 copies / mL. A negative result does not preclude SARS-CoV-2 infection and should not be used as the sole basis for treatment or other patient management decisions.  A negative result may occur with improper specimen collection / handling, submission of specimen other than nasopharyngeal swab, presence of viral mutation(s) within the areas targeted by this assay, and inadequate number of viral copies (<250 copies / mL). A negative result must be combined with clinical observations, patient history, and epidemiological information.  Fact Sheet for Patients:   StrictlyIdeas.no  Fact Sheet for Healthcare Providers: BankingDealers.co.za  This test is not yet approved or  cleared by the [place]  States FDA and has been authorized for detection and/or diagnosis of SARS-CoV-2 by FDA under an Emergency Use Authorization (EUA).  This EUA will remain in effect (meaning this test can be used) for the duration of the COVID-19 declaration under Section 564(b)(1) of the Act, 21 U.S.C. section 360bbb-3(b)(1), unless the authorization is terminated or revoked sooner.  Performed at [facility], [address], [place], [place] [address]       Radiology Studies: DG Chest 2 View  Result Date: 05/31/2020 CLINICAL DATA:  Abnormal CT EXAM: CHEST - 2 VIEW COMPARISON:  CT 05/31/2020, chest x-ray 05/23/2020 FINDINGS: Tiny pleural effusions on lateral view. No consolidation or effusion. Normal heart size. No pneumothorax. Aortic atherosclerosis. IMPRESSION: Tiny pleural effusion on lateral view. Electronically Signed   By: Donavan Foil M.D.   On: 05/31/2020 21:56   DG Abd 1 View  Result Date: 05/31/2020 CLINICAL DATA:  Lower abdominal pain. EXAM: ABDOMEN - 1 VIEW COMPARISON:  Subsequent abdominal CT, available at time of radiograph interpretation. FINDINGS: No bowel dilatation to suggest obstruction. Small volume of colonic  stool. No evidence of free air. No radiopaque calculi. Right pelvic phlebolith. Aortic atherosclerosis. Scoliosis and degenerative change in the spine. IMPRESSION: Normal bowel gas pattern. Electronically Signed   By: Keith Rake M.D.   On: 05/31/2020 19:49   CT Renal Stone Study  Result Date: 05/31/2020 CLINICAL DATA:  Flank pain, kidney stone suspected. Worsening kidney function. Weakness for 2 weeks. White blood cell count 75. EXAM: CT ABDOMEN AND PELVIS WITHOUT CONTRAST TECHNIQUE: Multidetector CT imaging of the abdomen and pelvis was performed following the standard protocol without IV contrast. COMPARISON:  Radiograph earlier today. FINDINGS: Lower chest: Anterior lingular opacity abutting the left heart border is only partially included in the field of view. There is additional streaky atelectasis in the lower lobes. Trace left pleural effusion. Small to moderate hiatal hernia. Coronary artery calcifications. There are small epicardial nodes. Hepatobiliary: No evidence of focal hepatic lesion on noncontrast exam. No calcified gallstone or pericholecystic inflammation. No biliary dilatation. Pancreas: No ductal dilatation or inflammation. Fullness in the region the pancreatic head which is adjacent to retroperitoneal fullness/adenopathy. This is not well assessed on noncontrast exam. Spleen: Normal in size. Greatest splenic dimension 12.6 cm. No focal abnormality on noncontrast exam. Adrenals/Urinary Tract: Bilateral adrenal thickening. No hydronephrosis. No renal or ureteral calculi. Urinary bladder is completely empty and not well assessed. Stomach/Bowel: Bowel evaluation is limited in the absence of contrast and presence of intra-abdominal ascites. Small to moderate hiatal hernia. Stomach is decompressed. There is no evidence of small-bowel obstruction. Equivocal wall thickening of the distal and terminal ileum versus nondistention. Normal appendix. Majority of the colon is nondistended and not well  assessed. There is no colonic inflammation. Small volume of stool in the sigmoid colon and rectum. Vascular/Lymphatic: Aortic atherosclerosis. No aortic aneurysm. Multifocal adenopathy throughout the abdomen and pelvis. Majority of the lymph nodes are 10-15 mm. There are enlarged retrocrural, retroperitoneal, mesenteric and omental nodes. Retroperitoneal adenopathy is difficult to delineate from the adjacent vascular structures. Representative left periaortic node at the level of the left kidney measures 14 mm, series 2, image 36. Representative ileocolic node measures 10 mm., series 2, image 55. Central mesenteric node measures 15 mm, series 2, image 48. There is no inguinal adenopathy. There is generalized mesenteric and omental edema and fat stranding. Reproductive: Uterus is grossly normal. Limited adnexal assessment. Other: Generalized mesenteric and omental edema and fat stranding. There is a small volume  of abdominopelvic ascites. No free air. Mild subcutaneous edema of the anterior abdominal wall. Musculoskeletal: Multilevel degenerative change throughout the spine. Near complete disc space loss at L1-L2. No focal osseous abnormality. IMPRESSION: 1. No renal stones or obstructive uropathy. 2. Multifocal adenopathy throughout the abdomen and pelvis, including retrocrural, retroperitoneal, mesenteric, and omental enlarged nodes. Majority of these nodes measure 10-15 mm. In the setting of markedly elevated white blood cell count findings are suspicious for lymphoproliferative disorder. Metastatic disease is also considered. 3. Equivocal wall thickening of the distal and terminal ileum versus nondistention. 4. Generalized edema of the mesentery in the anterior omentum. Small volume abdominopelvic ascites. 5. Anterior lingular opacity abutting the left heart border is only partially included in the field of view. This is nonspecific in the setting. Consider completion imaging with chest CT. 6. Small to moderate  hiatal hernia. Aortic Atherosclerosis (ICD10-I70.0). Electronically Signed   By: Keith Rake M.D.   On: 05/31/2020 19:48     Scheduled Meds:  aspirin EC  81 mg Oral Daily   heparin  5,000 Units Subcutaneous Q8H   metoprolol tartrate  25 mg Oral BID   pantoprazole  40 mg Oral Daily   Continuous Infusions:  sodium chloride 125 mL/hr at 06/01/20 1544     LOS: 1 day     Enzo Bi, MD [facility] If 7PM-7AM, please contact night-coverage 06/01/2020, 3:45 PM

## 2020-06-01 NOTE — Progress Notes (Signed)
Haley Love was recently admitted.  Her counts are up a bit.  Her hemoglobin was a little bit down

## 2020-06-01 NOTE — Consult Note (Signed)
Hematology/Oncology Consult note [facility] Telephone:([phone] [phone]  Patient Care Team: Crecencio Mc, MD as PCP - General (Internal Medicine) Magrinat, Virgie Dad, MD as Consulting Physician (Oncology) Marry Guan Laurice Record, MD (Orthopedic Surgery)   Name of the patient: Haley Love  [id number]  1934-10-08    Reason for consult: leucocytosis   Requesting physician: DR. Jari Pigg  Date of visit: 06/01/2020   History of presenting illness-patient is a 86 year old female with history of T-cell prolymphocytic leukemia for which she sees Dr. [name].  She has not required any treatment for this so far and was last seen by him in January 2021.Back then her white cell count was fluctuating between 33-37.  More recently she was noted to have a white cell count of 48 on 03/02/2020, 54 on 05/17/2020 and 77 on 05/23/2020.  She was seen by Dr. Derrel Nip in the outpatient and was noted to have AKI with a creatinine of 3.3 and a calcium of 12.2 and was sent to the hospital.  She has been receiving IV hydration since then and serum creatinine has mildly improved to 3.5 after it went up to 3.9.  Calcium is better at 10.9.  Given the renal failure she underwent CT renal stone study which did not show any renal stones but showed multifocal adenopathy in the abdomen and pelvis measuring 10 to 15 mm.  Patient has not had any prior CT abdomens for comparison to know how long she has had these lymph nodes.  Patient states she has been feeling poorly over the last 2 months. Reports having a poor appetite and ongoing weight loss. Abdomen feels bloated  ECOG PS- 3  Pain scale- 3   Review of systems- Review of Systems  Constitutional: Positive for malaise/fatigue and weight loss. Negative for chills and fever.  HENT: Negative for congestion, ear discharge and nosebleeds.   Eyes: Negative for blurred vision.  Respiratory: Negative for cough, hemoptysis, sputum production,  shortness of breath and wheezing.   Cardiovascular: Negative for chest pain, palpitations, orthopnea and claudication.  Gastrointestinal: Negative for abdominal pain, blood in stool, constipation, diarrhea, heartburn, melena, nausea and vomiting.       Abdominal bloating  Genitourinary: Negative for dysuria, flank pain, frequency, hematuria and urgency.  Musculoskeletal: Negative for back pain, joint pain and myalgias.  Skin: Negative for rash.  Neurological: Negative for dizziness, tingling, focal weakness, seizures, weakness and headaches.  Endo/Heme/Allergies: Does not bruise/bleed easily.  Psychiatric/Behavioral: Negative for depression and suicidal ideas. The patient does not have insomnia.     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) ([place]) 08/10/2018   Myalgia due to statin 08/09/2017   Goals of care, counseling/discussion 08/06/2017   Diabetes mellitus type 2, diet-controlled ([place]) 03/01/2016   Essential hypertension 02/27/2016   MGUS (monoclonal gammopathy of unknown significance) 08/19/2014   CKD (chronic kidney disease) stage 3, GFR 30-59 ml/min 08/13/2014   Leukocytosis 08/13/2014   Malignant neoplasm of lower-outer quadrant of left breast of female, estrogen receptor  positive ([place]) 10/31/2013   S/P TKR (total knee replacement) 08/04/2013   Encounter for Medicare annual wellness exam 08/04/2013   History of scabies 08/03/2013   Hyperlipidemia with target  LDL less than 100 12/22/2012   Tubular adenoma of colon 12/21/2012   Dermatitis 12/14/2012     Past Medical History:  Diagnosis Date   Breast cancer ([place])    Cataract    Chickenpox    Contact lens/glasses fitting    Diabetes mellitus without complication ([place])    Hyperlipidemia    Hypertension    Inflammatory polyps of colon [facility])      Past Surgical History:  Procedure Laterality Date   BREAST LUMPECTOMY Left 07/28/2012   BREAST SURGERY  9/13   lt lump   cataracts  2014   COLONOSCOPY WITH PROPOFOL N/A 01/05/2017   Procedure: COLONOSCOPY WITH PROPOFOL;  Surgeon: Manya Silvas, MD;  Location: [place];  Service: Endoscopy;  Laterality: N/A;   JOINT REPLACEMENT Right July 2015   [place], right knee   KNEE ARTHROSCOPY W/ MENISCAL REPAIR  09/23/11   Right   MASTECTOMY, PARTIAL  08/06/2012   Procedure: MASTECTOMY PARTIAL;  Surgeon: Adin Hector, MD;  Location: [place];  Service: General;  Laterality: Left;  Left partial mastectomy with re-excision of margins    Social History   Socioeconomic History   Marital status: Divorced    Spouse name: Not on file   Number of children: Not on file   Years of education: Not on file   Highest education level: Not on file  Occupational History   Not on file  Tobacco Use   Smoking status: Never Smoker   Smokeless tobacco: Never Used  Vaping Use   Vaping Use: Never used  Substance and Sexual Activity   Alcohol use: Yes    Comment: 1 glass wine week   Drug use: No   Sexual activity: Never    Birth control/protection: Post-menopausal  Other Topics Concern   Not on file  Social History Narrative   Lives alone ,     Social Determinants of Health   Financial Resource Strain:    Difficulty of Paying Living Expenses:   Food Insecurity:    Worried About Charity fundraiser in the Last Year:    Arboriculturist in the Last Year:   Transportation Needs:    Lexicographer (Medical):    Lack of Transportation (Non-Medical):   Physical Activity:    Days of Exercise per Week:    Minutes of Exercise per Session:   Stress:    Feeling of Stress :   Social Connections:    Frequency of Communication with Friends and Family:    Frequency of Social Gatherings with Friends and Family:    Attends Religious Services:    Active Member of Clubs or Organizations:    Attends Music therapist:    Marital Status:   Intimate Partner Violence:    Fear of Current or Ex-Partner:    Emotionally Abused:    Physically Abused:    Sexually Abused:      Family History  Problem Relation Age of Onset   Lung cancer Mother    Arthritis Mother    Hyperlipidemia Mother    Hypertension Mother    Throat cancer Father    Hyperlipidemia Father    Cancer Paternal Aunt        breast   Dementia Brother    Leukemia Brother  lymphatic /CLL     Current Facility-Administered Medications:    0.9 %  sodium chloride infusion, , Intravenous, Continuous, Enzo Bi, MD, Last Rate: 75 mL/hr at 06/01/20 1013, Rate Change at 06/01/20 1013   acetaminophen (TYLENOL) tablet 650 mg, 650 mg, Oral, Q6H PRN **OR** acetaminophen (TYLENOL) suppository 650 mg, 650 mg, Rectal, Q6H PRN, Nicolette Bang, DO   aspirin EC tablet 81 mg, 81 mg, Oral, Daily, Enzo Bi, MD, 81 mg at 06/01/20 1311   heparin injection 5,000 Units, 5,000 Units, Subcutaneous, Q8H, Nicolette Bang, DO, 5,000 Units at 06/01/20 1311   metoprolol tartrate (LOPRESSOR) tablet 25 mg, 25 mg, Oral, BID, Nicolette Bang, DO, 25 mg at 06/01/20 0924   pantoprazole (PROTONIX) EC tablet 40 mg, 40 mg, Oral, Daily, Nicolette Bang, DO, 40 mg at 06/01/20 0924   Physical exam:  Vitals:   06/01/20 0619 06/01/20 0745 06/01/20 0900 06/01/20 1220  BP: (!) 125/45 (!) 140/45 (!) 131/40 (!) 138/52  Pulse: 88 86 86 72  Resp: _0 Temp: 98.6 F  (37 C) 97.9 F (36.6 C) 98.1 F (36.7 C) 97.7 F (36.5 C)  TempSrc: Oral Oral Oral Oral  SpO2: 96% 95% 96% 97%  Weight:      Height:       Physical Exam Constitutional:      Comments: Appears fatigued  Cardiovascular:     Rate and Rhythm: Normal rate and regular rhythm.     Heart sounds: Normal heart sounds.  Pulmonary:     Effort: Pulmonary effort is normal.     Breath sounds: Normal breath sounds.  Abdominal:     General: Bowel sounds are normal.     Palpations: Abdomen is soft.     Comments: Mildly Distended. Non tender  Lymphadenopathy:     Comments: No palpable cervical, supraclavicular, axillary or inguinal adenopathy   Skin:    General: Skin is warm and dry.  Neurological:     Mental Status: She is alert and oriented to person, place, and time.        CMP Latest Ref Rng & Units 06/01/2020  Glucose 70 - 99 mg/dL 72  BUN 8 - 23 mg/dL 64(H)  Creatinine 0.44 - 1.00 mg/dL 3.53(H)  Sodium 135 - 145 mmol/L 130(L)  Potassium 3.5 - 5.1 mmol/L 5.4(H)  Chloride 98 - 111 mmol/L 99  CO2 22 - 32 mmol/L 21(L)  Calcium 8.9 - 10.3 mg/dL 10.9(H)  Total Protein 6.5 - 8.1 g/dL -  Total Bilirubin 0.3 - 1.2 mg/dL -  Alkaline Phos 38 - 126 U/L -  AST 15 - 41 U/L -  ALT 0 - 44 U/L -   CBC Latest Ref Rng & Units 06/01/2020  WBC 4.0 - 10.5 K/uL 91.7(HH)  Hemoglobin 12.0 - 15.0 g/dL 10.2(L)  Hematocrit 36 - 46 % 29.7(L)  Platelets 150 - 400 K/uL 216    _1 @  DG Chest 2 View  Result Date: 05/31/2020 CLINICAL DATA:  Abnormal CT EXAM: CHEST - 2 VIEW COMPARISON:  CT 05/31/2020, chest x-ray 05/23/2020 FINDINGS: Tiny pleural effusions on lateral view. No consolidation or effusion. Normal heart size. No pneumothorax. Aortic atherosclerosis. IMPRESSION: Tiny pleural effusion on lateral view. Electronically Signed   By: Donavan Foil M.D.   On: 05/31/2020 21:56   DG Chest 2 View  Result Date: 05/23/2020 CLINICAL DATA:  Chest burning.  Chest pain. EXAM: CHEST - 2 VIEW COMPARISON:   Radiograph 06/23/2013 FINDINGS: The cardiomediastinal contours are  normal. The lungs are clear. Aortic atherosclerosis. Pulmonary vasculature is normal. No consolidation, pleural effusion, or pneumothorax. No acute osseous abnormalities are seen. Degenerative change in the thoracic spine. Surgical clips project over the left breast/chest wall. IMPRESSION: No acute chest findings. Electronically Signed   By: Keith Rake M.D.   On: 05/23/2020 04:16   DG Abd 1 View  Result Date: 05/31/2020 CLINICAL DATA:  Lower abdominal pain. EXAM: ABDOMEN - 1 VIEW COMPARISON:  Subsequent abdominal CT, available at time of radiograph interpretation. FINDINGS: No bowel dilatation to suggest obstruction. Small volume of colonic stool. No evidence of free air. No radiopaque calculi. Right pelvic phlebolith. Aortic atherosclerosis. Scoliosis and degenerative change in the spine. IMPRESSION: Normal bowel gas pattern. Electronically Signed   By: Keith Rake M.D.   On: 05/31/2020 19:49   CT Renal Stone Study  Result Date: 05/31/2020 CLINICAL DATA:  Flank pain, kidney stone suspected. Worsening kidney function. Weakness for 2 weeks. White blood cell count 75. EXAM: CT ABDOMEN AND PELVIS WITHOUT CONTRAST TECHNIQUE: Multidetector CT imaging of the abdomen and pelvis was performed following the standard protocol without IV contrast. COMPARISON:  Radiograph earlier today. FINDINGS: Lower chest: Anterior lingular opacity abutting the left heart border is only partially included in the field of view. There is additional streaky atelectasis in the lower lobes. Trace left pleural effusion. Small to moderate hiatal hernia. Coronary artery calcifications. There are small epicardial nodes. Hepatobiliary: No evidence of focal hepatic lesion on noncontrast exam. No calcified gallstone or pericholecystic inflammation. No biliary dilatation. Pancreas: No ductal dilatation or inflammation. Fullness in the region the pancreatic head which is  adjacent to retroperitoneal fullness/adenopathy. This is not well assessed on noncontrast exam. Spleen: Normal in size. Greatest splenic dimension 12.6 cm. No focal abnormality on noncontrast exam. Adrenals/Urinary Tract: Bilateral adrenal thickening. No hydronephrosis. No renal or ureteral calculi. Urinary bladder is completely empty and not well assessed. Stomach/Bowel: Bowel evaluation is limited in the absence of contrast and presence of intra-abdominal ascites. Small to moderate hiatal hernia. Stomach is decompressed. There is no evidence of small-bowel obstruction. Equivocal wall thickening of the distal and terminal ileum versus nondistention. Normal appendix. Majority of the colon is nondistended and not well assessed. There is no colonic inflammation. Small volume of stool in the sigmoid colon and rectum. Vascular/Lymphatic: Aortic atherosclerosis. No aortic aneurysm. Multifocal adenopathy throughout the abdomen and pelvis. Majority of the lymph nodes are 10-15 mm. There are enlarged retrocrural, retroperitoneal, mesenteric and omental nodes. Retroperitoneal adenopathy is difficult to delineate from the adjacent vascular structures. Representative left periaortic node at the level of the left kidney measures 14 mm, series 2, image 36. Representative ileocolic node measures 10 mm., series 2, image 55. Central mesenteric node measures 15 mm, series 2, image 48. There is no inguinal adenopathy. There is generalized mesenteric and omental edema and fat stranding. Reproductive: Uterus is grossly normal. Limited adnexal assessment. Other: Generalized mesenteric and omental edema and fat stranding. There is a small volume of abdominopelvic ascites. No free air. Mild subcutaneous edema of the anterior abdominal wall. Musculoskeletal: Multilevel degenerative change throughout the spine. Near complete disc space loss at L1-L2. No focal osseous abnormality. IMPRESSION: 1. No renal stones or obstructive uropathy. 2.  Multifocal adenopathy throughout the abdomen and pelvis, including retrocrural, retroperitoneal, mesenteric, and omental enlarged nodes. Majority of these nodes measure 10-15 mm. In the setting of markedly elevated white blood cell count findings are suspicious for lymphoproliferative disorder. Metastatic disease is also considered. 3. Equivocal wall  thickening of the distal and terminal ileum versus nondistention. 4. Generalized edema of the mesentery in the anterior omentum. Small volume abdominopelvic ascites. 5. Anterior lingular opacity abutting the left heart border is only partially included in the field of view. This is nonspecific in the setting. Consider completion imaging with chest CT. 6. Small to moderate hiatal hernia. Aortic Atherosclerosis (ICD10-I70.0). Electronically Signed   By: Keith Rake M.D.   On: 05/31/2020 19:48    Assessment and plan- Patient is a 86 y.o. female with history of T-cell prolymphocytic leukemia admitted for AKI  1.  AKI and hypercalcemia: suspect this is secondary to spontaneous TLS in the setting of progressive T cell pro lymphocytic lymhoma.  She is on IV Fluids maintenance at 75 cc an hour.  Values are improving and I would recommend increasing her hydration rate given that her hypercalcemia is still mild and renal failure persists.  Recommend nephrology consult.  She does have a history of MGUS and her M spike is typically around 0.3.  I will plan to check a repeat myeloma panel and serum free light chain at this time.  Patient was found to have moderate leukocyte esterase and greater than 50 WBCs on her urine.  Urine culture is pending.    2.  T-cell prolymphocytic leukemia: she appears to have progression of her t cell pro lymphocytic leukemia based on elevated WBC, elevated LDH and features of spontantaneous TLS. She has diffuse intra-abdominal adenopathy which is nonbulky with lymph nodes measuring 1 to 1.5 cm and it is unclear as to how long she has had  these lymph nodes.  No splenomegaly or liver abnormality noted on CT.  However her white cell count has. Rapidly increased over the last 2 months from her baseline of 37-91 presently.  I have ordered another flow cytometry at this time.  Also her anemia is mildly worse at 10.2.    LDH is significantly elevated at 1247. Uric acid elevated at 9.8. she has been started on allopurinol.G6PD level pending. Hold off on rasburicase until then. Check BMP BID  I met with patients son and daughter later after I spoke to the patient and presented the following options:  1. T cell leukemia in its progressive phase carries overall poor prognosis without treatment < 3 months. Given her age and fraility, best supportive care/ hospice is reasonable approach.  2. Treatments such as alemtuzumab if attempted- need to be given at a tertiary center and is associated with increased risk of infusion reactions, infection risk. If active treatment is considered she will need transfer to [facility] or [place]. Overall prognosis despite treatment (if she tolerates it) could be 1-3 years but she is not a transplant candidate. As per my discussion with DR. Magrinat he recommends that as well over transfer to [facility]. Also it remains to be seen if she would be deemed a treatment candidate when she goes to tertiary center.   3. Continue supportive care here and attempt to reverse her renal failure, potential discharge home and discussion with Dr. [name] as an outpatient on a fairly urgent basis  Patients children would like to discuss all this among themselves and present these options to the patient tomorrow in a staged fashion. They understand that while she is here, only supportive treatment can be offered but her leukemia cannot be treated as such.   Total face to face encounter time for this patient and family visit was 45 min.  Total non face to face encounter  time for this patient on the day of the visit was 20 min. Time spent  discussing patients case with Dr. Billie Ruddy and Dr. Candiss Norse      Visit Diagnosis 1. CLL (chronic lymphocytic leukemia) ([place])   2. AKI (acute kidney injury) ([place])   3. Abnormal findings on diagnostic imaging of abdomen                                                                                  4. Tumor lysis syndrome Dr. Randa Evens, MD, MPH [facility] [phone] 06/01/2020  9:22 PM

## 2020-06-02 ENCOUNTER — Inpatient Hospital Stay: Payer: Medicare HMO

## 2020-06-02 ENCOUNTER — Other Ambulatory Visit: Payer: Self-pay | Admitting: Internal Medicine

## 2020-06-02 LAB — GLUCOSE, CAPILLARY
Glucose-Capillary: 74 mg/dL (ref 70–99)
Glucose-Capillary: 58 mg/dL — ABNORMAL LOW (ref 70–99)
Glucose-Capillary: 70 mg/dL (ref 70–99)
Glucose-Capillary: 74 mg/dL (ref 70–99)

## 2020-06-02 LAB — BASIC METABOLIC PANEL
Anion gap: 7 (ref 5–15)
Anion gap: 10 (ref 5–15)
BUN: 62 mg/dL — ABNORMAL HIGH (ref 8–23)
BUN: 59 mg/dL — ABNORMAL HIGH (ref 8–23)
CO2: 21 mmol/L — ABNORMAL LOW (ref 22–32)
CO2: 18 mmol/L — ABNORMAL LOW (ref 22–32)
Calcium: 11.4 mg/dL — ABNORMAL HIGH (ref 8.9–10.3)
Calcium: 11.6 mg/dL — ABNORMAL HIGH (ref 8.9–10.3)
Chloride: 102 mmol/L (ref 98–111)
Chloride: 98 mmol/L (ref 98–111)
Creatinine, Ser: 2.39 mg/dL — ABNORMAL HIGH (ref 0.44–1.00)
Creatinine, Ser: 2.35 mg/dL — ABNORMAL HIGH (ref 0.44–1.00)
GFR calc Af Amer: 21 mL/min — ABNORMAL LOW (ref >60)
GFR calc Af Amer: 21 mL/min — ABNORMAL LOW (ref >60)
GFR calc non Af Amer: 18 mL/min — ABNORMAL LOW (ref >60)
GFR calc non Af Amer: 19 mL/min — ABNORMAL LOW (ref >60)
Glucose, Bld: 66 mg/dL — ABNORMAL LOW (ref 70–99)
Glucose, Bld: 84 mg/dL (ref 70–99)
Potassium: 5.2 mmol/L — ABNORMAL HIGH (ref 3.5–5.1)
Potassium: 5.3 mmol/L — ABNORMAL HIGH (ref 3.5–5.1)
Sodium: 130 mmol/L — ABNORMAL LOW (ref 135–145)
Sodium: 126 mmol/L — ABNORMAL LOW (ref 135–145)

## 2020-06-02 LAB — HIV ANTIBODY (ROUTINE TESTING W REFLEX): HIV Screen 4th Generation wRfx: NONREACTIVE

## 2020-06-02 LAB — IRON AND TIBC
Iron: 49 ug/dL (ref 28–170)
Saturation Ratios: 20 % (ref 10.4–31.8)
TIBC: 249 ug/dL — ABNORMAL LOW (ref 250–450)
UIBC: 200 ug/dL

## 2020-06-02 LAB — CBC
HCT: 32.2 % — ABNORMAL LOW (ref 36.0–46.0)
Hemoglobin: 11 g/dL — ABNORMAL LOW (ref 12.0–15.0)
MCH: 31.1 pg (ref 26.0–34.0)
MCHC: 34.2 g/dL (ref 30.0–36.0)
MCV: 91 fL (ref 80.0–100.0)
Platelets: 219 10*3/uL (ref 150–400)
RBC: 3.54 MIL/uL — ABNORMAL LOW (ref 3.87–5.11)
RDW: 13.7 % (ref 11.5–15.5)
WBC: 89.9 10*3/uL (ref 4.0–10.5)
nRBC: 0 % (ref 0.0–0.2)

## 2020-06-02 LAB — MAGNESIUM: Magnesium: 1.5 mg/dL — ABNORMAL LOW (ref 1.7–2.4)

## 2020-06-02 LAB — RETICULOCYTES
Immature Retic Fract: 22.4 % — ABNORMAL HIGH (ref 2.3–15.9)
RBC.: 3.54 MIL/uL — ABNORMAL LOW (ref 3.87–5.11)
Retic Count, Absolute: 98.1 10*3/uL (ref 19.0–186.0)
Retic Ct Pct: 2.8 % (ref 0.4–3.1)

## 2020-06-02 LAB — HEPATITIS C ANTIBODY: HCV Ab: NONREACTIVE

## 2020-06-02 LAB — URINE CULTURE: Culture: 80000 — AB

## 2020-06-02 LAB — LACTATE DEHYDROGENASE: LDH: 1290 U/L — ABNORMAL HIGH (ref 98–192)

## 2020-06-02 LAB — VITAMIN B12: Vitamin B-12: 1118 pg/mL — ABNORMAL HIGH (ref 180–914)

## 2020-06-02 LAB — URIC ACID: Uric Acid, Serum: 9.3 mg/dL — ABNORMAL HIGH (ref 2.5–7.1)

## 2020-06-02 LAB — FERRITIN: Ferritin: 238 ng/mL (ref 11–307)

## 2020-06-02 LAB — HEPATITIS B SURFACE ANTIBODY,QUALITATIVE: Hep B S Ab: NONREACTIVE

## 2020-06-02 LAB — FOLATE: Folate: 25 ng/mL (ref >5.9)

## 2020-06-02 MED ORDER — PATIROMER SORBITEX CALCIUM 8.4 G PO PACK
8.4000 g | PACK | Freq: Every day | ORAL | Status: DC
  Administered 2020-06-02 – 2020-06-04 (×3): 8.4 g via ORAL
  Filled 2020-06-02 (×4): qty 1

## 2020-06-02 MED ORDER — GUAIFENESIN-DM 100-10 MG/5ML PO SYRP: 10.0000 mL | ORAL_SOLUTION | Freq: Four times a day (QID) | ORAL | Status: DC | PRN

## 2020-06-02 MED ORDER — CALCIUM CARBONATE ANTACID 500 MG PO CHEW
1.0000 | CHEWABLE_TABLET | Freq: Three times a day (TID) | ORAL | Status: DC | PRN
  Administered 2020-06-02: 200 mg via ORAL
  Filled 2020-06-02: qty 1

## 2020-06-02 MED ORDER — ONDANSETRON HCL 4 MG/2ML IJ SOLN
4.0000 mg | Freq: Four times a day (QID) | INTRAMUSCULAR | Status: DC | PRN
  Filled 2020-06-02: qty 2

## 2020-06-02 MED ORDER — ALLOPURINOL 100 MG PO TABS
100.0000 mg | ORAL_TABLET | Freq: Every day | ORAL | Status: AC
Start: 2020-06-02 — End: ?

## 2020-06-02 MED ORDER — POLYETHYLENE GLYCOL 3350 17 G PO PACK: 17.0000 g | PACK | Freq: Two times a day (BID) | ORAL | Status: DC | PRN

## 2020-06-02 MED ORDER — DEXTROSE-NACL 5-0.9 % IV SOLN: 125.0000 mL/h | INTRAVENOUS | Status: AC

## 2020-06-02 MED ORDER — ONDANSETRON 4 MG PO TBDP
4.0000 mg | ORAL_TABLET | Freq: Three times a day (TID) | ORAL | Status: DC | PRN
  Administered 2020-06-02: 4 mg via ORAL
  Filled 2020-06-02: qty 1

## 2020-06-02 MED ORDER — DOCUSATE SODIUM 100 MG PO CAPS
100.0000 mg | ORAL_CAPSULE | Freq: Two times a day (BID) | ORAL | Status: DC | PRN
  Administered 2020-06-02: 100 mg via ORAL
  Filled 2020-06-02: qty 1

## 2020-06-02 MED ORDER — DEXTROSE-NACL 5-0.9 % IV SOLN: INTRAVENOUS | Status: DC

## 2020-06-02 MED ORDER — ALLOPURINOL 100 MG PO TABS
100.0000 mg | ORAL_TABLET | Freq: Every day | ORAL | Status: DC
  Administered 2020-06-02 – 2020-06-03 (×2): 100 mg via ORAL
  Filled 2020-06-02 (×3): qty 1

## 2020-06-02 MED ORDER — PATIROMER SORBITEX CALCIUM 8.4 G PO PACK: 8.4000 g | PACK | Freq: Every day | ORAL | Status: AC

## 2020-06-02 MED ORDER — SODIUM POLYSTYRENE SULFONATE 15 GM/60ML PO SUSP
15.0000 g | Freq: Once | ORAL | Status: DC
  Filled 2020-06-02: qty 60

## 2020-06-02 MED ORDER — ALUM & MAG HYDROXIDE-SIMETH 200-200-20 MG/5ML PO SUSP
15.0000 mL | Freq: Four times a day (QID) | ORAL | Status: DC | PRN
  Administered 2020-06-02: 15 mL via ORAL
  Filled 2020-06-02: qty 30

## 2020-06-02 MED ORDER — MAGNESIUM SULFATE 2 GM/50ML IV SOLN
2.0000 g | Freq: Once | INTRAVENOUS | Status: AC
  Administered 2020-06-02: 2 g via INTRAVENOUS
  Filled 2020-06-02: qty 50

## 2020-06-02 MED ORDER — PROCHLORPERAZINE EDISYLATE 10 MG/2ML IJ SOLN
10.0000 mg | Freq: Four times a day (QID) | INTRAMUSCULAR | Status: DC | PRN
  Administered 2020-06-02: 10 mg via INTRAVENOUS
  Filled 2020-06-02: qty 2

## 2020-06-02 NOTE — Discharge Summary (Signed)
Physician Discharge Summary   Haley Love  female DOB: 1934-03-17  POE:[id number]  PCP: Crecencio Mc, MD  Admit date: 05/31/2020 Discharge date: 06/02/2020  Admitted From: home Disposition:  Transfer to leukemia service at [facility].  Daughter updated at the bedside today prior to transfer.  CODE STATUS: Full code     Hospital Course:  For full details, please see H&P, progress notes, consult notes and ancillary notes.  Briefly,  Haley Belding Ferrellis85 y.o.Caucasian female with hx of CLL, DM, HTN, HLD who presented to ED per PCP recommendation for Cr 3.38 and WBC 75. Patient endorsing intermittent generalized weakness for the past week.  Pt was seen in ED on 6/30 for similar complaints. No improvement since then.   AKI Hyperkalemia 2/2 AKI Cr 3.92. BUN 61. GFR 10. K 5.2. Baseline Cr appears to be around 1.1.  CT renal study negative for stones, hydronephrosis, urinary obstruction.  Bladder scan did not show increased PVR.  AKI suspect related to dehydration from poor PO intake along with use of thiazide diuretic and ARB.  Denied NSAID use. Given 1L IVF bolus in ED f/b MIVF@125  ml/hr.  Nephrology was consulted who recommended continuing MIVF, and starting Veltassa for mild hyperkalemia.  Cr did improve after IVF hydration, and was 2.35 prior to transfer.  Concern for tumor lysis syndrome Leukocytosis with history of T cell CLL  Hyperuricemia Recent WBC trend 54>75>88. CT renal study showed multifocal adenopathy which is most likely consistent with lymphoproliferative disorder.  ED provider discussed case with Oncology who initially stated unlikely to have blast crisis with CL, therefore pt was admitted.  Subsequent labs showed LDH increased to 1247 from 436 about 2 weeks ago, uric acid 9.8, and K elevated, concerning for mild tumor lysis syndrome.  Pt was continued on allopurinol 100 mg daily and MIVF@125  ml/hr.  After discussion between Oncology Dr. Janese Banks and pt and family,  decision was made to transfer to leukemia service in [facility] for more treatment options.  HTN  Systolic wnl, though diastolic consistently low.  Continued home Metoprolol.  Home Telmisartan held due to AKI.  Hx of T2DM Hypoglycemic episode Diet controlled. Last A1c 6.5% on 05/10/20.  Pt was found to be hypoglycemic at times due to poor PO intake.  Pt was therefore placed on BG check q4h and MIVF changed from NS to D5 NS afternoon of 7/10.  Pyuria UA on presentation has mod leuk, >50 WBC, but no bacteria.  Pt had no urinary symptoms.  Abx was not started.   Discharge Diagnoses:  Active Problems:   Hyperlipidemia with target LDL less than 100   Leukocytosis   Essential hypertension   Diabetes mellitus type 2, diet-controlled (HCC)   T-cell chronic lymphocytic leukemia (HCC)   AKI (acute kidney injury) [facility])    Discharge Instructions:  Allergies as of 06/02/2020      Reactions   No Known Allergies       Medication List    STOP taking these medications   Lactulose 20 GM/30ML Soln     TAKE these medications   allopurinol 100 MG tablet Commonly known as: ZYLOPRIM Take 1 tablet (100 mg total) by mouth daily. What changed:   medication strength  how much to take   aspirin 81 MG tablet Take 81 mg by mouth daily.   BORAGE OIL PO Take 1,000 mg by mouth daily.   CINNAMON PO Take 2,000 mg by mouth at bedtime.   Co Q-10 200 MG Caps Take 1  capsule by mouth at bedtime.   Collagenase Powd by Does not apply route daily at 6 (six) AM.   dextrose 5 % and 0.9% NaCl 5-0.9 % infusion Inject 125 mL/hr into the vein continuous.   Flax Seed Oil 1000 MG Caps Take by mouth.   Hyaluronic Acid 100 MG Caps Take 1 capsule by mouth.   Krill Oil 1500 MG Caps Take by mouth.   Magnesium 500 MG Caps Take 1 capsule by mouth daily.   metoprolol tartrate 25 MG tablet Commonly known as: LOPRESSOR Take 1 tablet (25 mg total) by mouth 2 (two) times daily.   Milk Thistle 300 MG  Caps Take by mouth daily at 6 (six) AM.   NARCOSOFT HERBAL LAX PO Take 2 tablets by mouth at bedtime.   omeprazole 20 MG capsule Commonly known as: PRILOSEC Take 1 capsule (20 mg total) by mouth 2 (two) times daily before a meal.   ondansetron 4 MG disintegrating tablet Commonly known as: Zofran ODT Take 1 tablet (4 mg total) by mouth every 8 (eight) hours as needed for nausea or vomiting.   patiromer 8.4 g packet Commonly known as: VELTASSA Take 1 packet (8.4 g total) by mouth daily. Start taking on: June 03, 2020   RA B-Complex/Vitamin C CR Tbcr Take by mouth.   RA Calcium 600/Vit D/Minerals 600-200 MG-UNIT Tabs Take tablets by mouth once a day.   RED YEAST RICE PO Take 1,000 mg by mouth 2 (two) times daily.   telmisartan 80 MG tablet Commonly known as: MICARDIS TAKE 1 TABLET BY MOUTH EVERY DAY What changed: when to take this   Vitamin D-3 25 MCG (1000 UT) Caps Take 1 capsule by mouth at bedtime.   vitamin E 180 MG (400 UNITS) capsule Take 400 Units by mouth daily.         Allergies  Allergen Reactions  . No Known Allergies      The results of significant diagnostics from this hospitalization (including imaging, microbiology, ancillary and laboratory) are listed below for reference.   Consultations:   Procedures/Studies: DG Chest 2 View  Result Date: 05/31/2020 CLINICAL DATA:  Abnormal CT EXAM: CHEST - 2 VIEW COMPARISON:  CT 05/31/2020, chest x-ray 05/23/2020 FINDINGS: Tiny pleural effusions on lateral view. No consolidation or effusion. Normal heart size. No pneumothorax. Aortic atherosclerosis. IMPRESSION: Tiny pleural effusion on lateral view. Electronically Signed   By: Donavan Foil M.D.   On: 05/31/2020 21:56   DG Chest 2 View  Result Date: 05/23/2020 CLINICAL DATA:  Chest burning.  Chest pain. EXAM: CHEST - 2 VIEW COMPARISON:  Radiograph 06/23/2013 FINDINGS: The cardiomediastinal contours are normal. The lungs are clear. Aortic atherosclerosis.  Pulmonary vasculature is normal. No consolidation, pleural effusion, or pneumothorax. No acute osseous abnormalities are seen. Degenerative change in the thoracic spine. Surgical clips project over the left breast/chest wall. IMPRESSION: No acute chest findings. Electronically Signed   By: Keith Rake M.D.   On: 05/23/2020 04:16   DG Abd 1 View  Result Date: 05/31/2020 CLINICAL DATA:  Lower abdominal pain. EXAM: ABDOMEN - 1 VIEW COMPARISON:  Subsequent abdominal CT, available at time of radiograph interpretation. FINDINGS: No bowel dilatation to suggest obstruction. Small volume of colonic stool. No evidence of free air. No radiopaque calculi. Right pelvic phlebolith. Aortic atherosclerosis. Scoliosis and degenerative change in the spine. IMPRESSION: Normal bowel gas pattern. Electronically Signed   By: Keith Rake M.D.   On: 05/31/2020 19:49   CT Renal Stone Study  Result  Date: 05/31/2020 CLINICAL DATA:  Flank pain, kidney stone suspected. Worsening kidney function. Weakness for 2 weeks. White blood cell count 75. EXAM: CT ABDOMEN AND PELVIS WITHOUT CONTRAST TECHNIQUE: Multidetector CT imaging of the abdomen and pelvis was performed following the standard protocol without IV contrast. COMPARISON:  Radiograph earlier today. FINDINGS: Lower chest: Anterior lingular opacity abutting the left heart border is only partially included in the field of view. There is additional streaky atelectasis in the lower lobes. Trace left pleural effusion. Small to moderate hiatal hernia. Coronary artery calcifications. There are small epicardial nodes. Hepatobiliary: No evidence of focal hepatic lesion on noncontrast exam. No calcified gallstone or pericholecystic inflammation. No biliary dilatation. Pancreas: No ductal dilatation or inflammation. Fullness in the region the pancreatic head which is adjacent to retroperitoneal fullness/adenopathy. This is not well assessed on noncontrast exam. Spleen: Normal in size.  Greatest splenic dimension 12.6 cm. No focal abnormality on noncontrast exam. Adrenals/Urinary Tract: Bilateral adrenal thickening. No hydronephrosis. No renal or ureteral calculi. Urinary bladder is completely empty and not well assessed. Stomach/Bowel: Bowel evaluation is limited in the absence of contrast and presence of intra-abdominal ascites. Small to moderate hiatal hernia. Stomach is decompressed. There is no evidence of small-bowel obstruction. Equivocal wall thickening of the distal and terminal ileum versus nondistention. Normal appendix. Majority of the colon is nondistended and not well assessed. There is no colonic inflammation. Small volume of stool in the sigmoid colon and rectum. Vascular/Lymphatic: Aortic atherosclerosis. No aortic aneurysm. Multifocal adenopathy throughout the abdomen and pelvis. Majority of the lymph nodes are 10-15 mm. There are enlarged retrocrural, retroperitoneal, mesenteric and omental nodes. Retroperitoneal adenopathy is difficult to delineate from the adjacent vascular structures. Representative left periaortic node at the level of the left kidney measures 14 mm, series 2, image 36. Representative ileocolic node measures 10 mm., series 2, image 55. Central mesenteric node measures 15 mm, series 2, image 48. There is no inguinal adenopathy. There is generalized mesenteric and omental edema and fat stranding. Reproductive: Uterus is grossly normal. Limited adnexal assessment. Other: Generalized mesenteric and omental edema and fat stranding. There is a small volume of abdominopelvic ascites. No free air. Mild subcutaneous edema of the anterior abdominal wall. Musculoskeletal: Multilevel degenerative change throughout the spine. Near complete disc space loss at L1-L2. No focal osseous abnormality. IMPRESSION: 1. No renal stones or obstructive uropathy. 2. Multifocal adenopathy throughout the abdomen and pelvis, including retrocrural, retroperitoneal, mesenteric, and omental  enlarged nodes. Majority of these nodes measure 10-15 mm. In the setting of markedly elevated white blood cell count findings are suspicious for lymphoproliferative disorder. Metastatic disease is also considered. 3. Equivocal wall thickening of the distal and terminal ileum versus nondistention. 4. Generalized edema of the mesentery in the anterior omentum. Small volume abdominopelvic ascites. 5. Anterior lingular opacity abutting the left heart border is only partially included in the field of view. This is nonspecific in the setting. Consider completion imaging with chest CT. 6. Small to moderate hiatal hernia. Aortic Atherosclerosis (ICD10-I70.0). Electronically Signed   By: Keith Rake M.D.   On: 05/31/2020 19:48      Labs: BNP (last 3 results) No results for input(s): BNP in the last 8760 hours. Basic Metabolic Panel: Recent Labs  Lab 05/31/20 1245 05/31/20 1245 05/31/20 1804 06/01/20 0623 06/01/20 1141 06/01/20 1733 06/02/20 0548 06/02/20 1710  NA 128*   < > 128* 130*  --  127* 130* 126*  K 5.2*   < > 5.2* 5.4*  --  5.5*  5.2* 5.3*  CL 93*   < > 93* 99  --  99 102 98  CO2 27   < > 22 21*  --  23 21* 18*  GLUCOSE 102*   < > 111* 72  --  78 66* 84  BUN 55*   < > 61* 64*  --  60* 62* 59*  CREATININE 3.38*   < > 3.92* 3.53*  --  2.85* 2.39* 2.35*  CALCIUM 12.2*   < > 11.9* 10.9*  --  10.9* 11.4* 11.6*  MG 1.7  --   --   --   --   --  1.5*  --   PHOS  --   --   --   --  3.5  --   --   --    < > = values in this interval not displayed.   Liver Function Tests: Recent Labs  Lab 05/31/20 1245 05/31/20 1810  AST 35 43*  ALT 24 28  ALKPHOS 79 75  BILITOT 0.8 1.1  PROT 6.0 6.3*  ALBUMIN 3.6 3.2*   Recent Labs  Lab 05/31/20 1804  LIPASE 40   No results for input(s): AMMONIA in the last 168 hours. CBC: Recent Labs  Lab 05/31/20 1245 05/31/20 1804 06/01/20 0623 06/02/20 0548  WBC 75.5 Repeated and verified X2.* 86.9*  88.0* 91.7* 89.9*  NEUTROABS 11.8* 11.3*  --    --   HGB 11.9* 12.3  12.2 10.2* 11.0*  HCT 37.4 35.1*  35.2* 29.7* 32.2*  MCV 94.6 89.3  88.0 89.2 91.0  PLT 257.0 271  269 216 219   Cardiac Enzymes: No results for input(s): CKTOTAL, CKMB, CKMBINDEX, TROPONINI in the last 168 hours. BNP: Invalid input(s): POCBNP CBG: Recent Labs  Lab 05/31/20 1819 06/02/20 0735 06/02/20 1308 06/02/20 1345  GLUCAP 104* 74 58* 70   D-Dimer No results for input(s): DDIMER in the last 72 hours. Hgb A1c No results for input(s): HGBA1C in the last 72 hours. Lipid Profile No results for input(s): CHOL, HDL, LDLCALC, TRIG, CHOLHDL, LDLDIRECT in the last 72 hours. Thyroid function studies No results for input(s): TSH, T4TOTAL, T3FREE, THYROIDAB in the last 72 hours.  Invalid input(s): FREET3 Anemia work up Recent Labs    06/02/20 0548  VITAMINB12 1,118*  FOLATE 25.0  FERRITIN 238  TIBC 249*  IRON 49  RETICCTPCT 2.8   Urinalysis    Component Value Date/Time   COLORURINE YELLOW (A) 06/01/2020 0126   APPEARANCEUR HAZY (A) 06/01/2020 0126   APPEARANCEUR Hazy 05/31/2014 0926   LABSPEC 1.013 06/01/2020 0126   LABSPEC 1.017 05/31/2014 0926   PHURINE 5.0 06/01/2020 0126   GLUCOSEU NEGATIVE 06/01/2020 0126   GLUCOSEU Negative 05/31/2014 0926   HGBUR NEGATIVE 06/01/2020 0126   BILIRUBINUR NEGATIVE 06/01/2020 0126   BILIRUBINUR Negative 05/31/2014 0926   KETONESUR NEGATIVE 06/01/2020 0126   PROTEINUR NEGATIVE 06/01/2020 0126   NITRITE NEGATIVE 06/01/2020 0126   LEUKOCYTESUR MODERATE (A) 06/01/2020 0126   LEUKOCYTESUR 2+ 05/31/2014 0926   Sepsis Labs Invalid input(s): PROCALCITONIN,  WBC,  LACTICIDVEN Microbiology Recent Results (from the past 240 hour(s))  SARS Coronavirus 2 by RT PCR (hospital order, performed in [place] hospital lab) Nasopharyngeal Nasopharyngeal Swab     Status: None   Collection Time: 05/31/20  8:42 PM   Specimen: Nasopharyngeal Swab  Result Value Ref Range Status   SARS Coronavirus 2 NEGATIVE NEGATIVE  Final    Comment: (NOTE) SARS-CoV-2 target nucleic acids are NOT DETECTED.  The  SARS-CoV-2 RNA is generally detectable in upper and lower respiratory specimens during the acute phase of infection. The lowest concentration of SARS-CoV-2 viral copies this assay can detect is 250 copies / mL. A negative result does not preclude SARS-CoV-2 infection and should not be used as the sole basis for treatment or other patient management decisions.  A negative result may occur with improper specimen collection / handling, submission of specimen other than nasopharyngeal swab, presence of viral mutation(s) within the areas targeted by this assay, and inadequate number of viral copies (<250 copies / mL). A negative result must be combined with clinical observations, patient history, and epidemiological information.  Fact Sheet for Patients:   StrictlyIdeas.no  Fact Sheet for Healthcare Providers: BankingDealers.co.za  This test is not yet approved or  cleared by the [place] FDA and has been authorized for detection and/or diagnosis of SARS-CoV-2 by FDA under an Emergency Use Authorization (EUA).  This EUA will remain in effect (meaning this test can be used) for the duration of the COVID-19 declaration under Section 564(b)(1) of the Act, 21 U.S.C. section 360bbb-3(b)(1), unless the authorization is terminated or revoked sooner.  Performed at [facility], [address], [place], [place] [address]   Urine Culture     Status: Abnormal   Collection Time: 06/01/20  1:26 AM   Specimen: Urine, Random  Result Value Ref Range Status   Specimen Description   Final    URINE, RANDOM Performed at [facility], [address], [place], [place] [address]    Special Requests   Final    NONE Performed at [facility], [address] [place], [place] [address]    Culture (A)  Final    80,000 COLONIES/mL GROUP B  STREP(S.AGALACTIAE)ISOLATED TESTING AGAINST S. AGALACTIAE NOT ROUTINELY PERFORMED DUE TO PREDICTABILITY OF AMP/PEN/VAN SUSCEPTIBILITY. Performed at [facility], [place] [address], [place], [place] [address]    Report Status 06/02/2020 FINAL  Final     Total time spend on discharging this patient, including the last patient exam, discussing the hospital stay, instructions for ongoing care as it relates to all pertinent caregivers, as well as preparing the medical discharge records, prescriptions, and/or referrals as applicable, is 80 minutes.    Enzo Bi, MD  [facility] 06/02/2020, 6:42 PM  If 7PM-7AM, please contact night-coverage

## 2020-06-02 NOTE — Progress Notes (Signed)
[facility]  ROUNDING NOTE   Subjective:  Patient sitting up in chair this a.m. Complaining of some abdominal bloating and distention. Renal function does appear to be improving. Creatinine down to 2.39. Potassium still slightly high at 5.2.   Objective:  Vital signs in last 24 hours:  Temp:  [97.8 F (36.6 C)-98.5 F (36.9 C)] 97.8 F (36.6 C) (07/10 1249) Pulse Rate:  [81-91] 83 (07/10 1249) Resp:  [16-20] 20 (07/10 1249) BP: (125-136)/(35-46) 126/37 (07/10 1249) SpO2:  [96 %-99 %] 96 % (07/10 1249)  Weight change:  Filed Weights   05/31/20 1735  Weight: 77 kg    Intake/Output: I/O last 3 completed shifts: In: 3697.9 [P.O.:840; I.V.:1857.9; IV Piggyback:1000] Out: 1400 [Urine:1400]   Intake/Output this shift:  Total I/O In: -  Out: 200 [Urine:200]  Physical Exam: General: No acute distress  Head: Normocephalic, atraumatic. Moist oral mucosal membranes  Eyes: Anicteric  Neck: Supple, trachea midline  Lungs:  Clear to auscultation, normal effort  Heart: S1S2 no rubs  Abdomen:  Soft, nontender, mild distention  Extremities: No peripheral edema.  Neurologic: Awake, alert, following commands  Skin: No lesions       Basic Metabolic Panel: Recent Labs  Lab 05/31/20 1245 05/31/20 1245 05/31/20 1804 05/31/20 1804 06/01/20 0623 06/01/20 1141 06/01/20 1733 06/02/20 0548  NA 128*  --  128*  --  130*  --  127* 130*  K 5.2*  --  5.2*  --  5.4*  --  5.5* 5.2*  CL 93*  --  93*  --  99  --  99 102  CO2 27  --  22  --  21*  --  23 21*  GLUCOSE 102*  --  111*  --  72  --  78 66*  BUN 55*  --  61*  --  64*  --  60* 62*  CREATININE 3.38*  --  3.92*  --  3.53*  --  2.85* 2.39*  CALCIUM 12.2*   < > 11.9*   < > 10.9*  --  10.9* 11.4*  MG 1.7  --   --   --   --   --   --  1.5*  PHOS  --   --   --   --   --  3.5  --   --    < > = values in this interval not displayed.    Liver Function Tests: Recent Labs  Lab 05/31/20 1245 05/31/20 1810  AST 35  43*  ALT 24 28  ALKPHOS 79 75  BILITOT 0.8 1.1  PROT 6.0 6.3*  ALBUMIN 3.6 3.2*   Recent Labs  Lab 05/31/20 1804  LIPASE 40   No results for input(s): AMMONIA in the last 168 hours.  CBC: Recent Labs  Lab 05/31/20 1245 05/31/20 1804 06/01/20 0623 06/02/20 0548  WBC 75.5 Repeated and verified X2.* 86.9*  88.0* 91.7* 89.9*  NEUTROABS 11.8* 11.3*  --   --   HGB 11.9* 12.3  12.2 10.2* 11.0*  HCT 37.4 35.1*  35.2* 29.7* 32.2*  MCV 94.6 89.3  88.0 89.2 91.0  PLT 257.0 271  269 216 219    Cardiac Enzymes: No results for input(s): CKTOTAL, CKMB, CKMBINDEX, TROPONINI in the last 168 hours.  BNP: Invalid input(s): POCBNP  CBG: Recent Labs  Lab 05/31/20 1819 06/02/20 0735 06/02/20 1308 06/02/20 1345  GLUCAP 104* 74 58* 70    Microbiology: Results for orders placed or performed during the hospital  encounter of 05/31/20  SARS Coronavirus 2 by RT PCR (hospital order, performed in [facility] hospital lab) Nasopharyngeal Nasopharyngeal Swab     Status: None   Collection Time: 05/31/20  8:42 PM   Specimen: Nasopharyngeal Swab  Result Value Ref Range Status   SARS Coronavirus 2 NEGATIVE NEGATIVE Final    Comment: (NOTE) SARS-CoV-2 target nucleic acids are NOT DETECTED.  The SARS-CoV-2 RNA is generally detectable in upper and lower respiratory specimens during the acute phase of infection. The lowest concentration of SARS-CoV-2 viral copies this assay can detect is 250 copies / mL. A negative result does not preclude SARS-CoV-2 infection and should not be used as the sole basis for treatment or other patient management decisions.  A negative result may occur with improper specimen collection / handling, submission of specimen other than nasopharyngeal swab, presence of viral mutation(s) within the areas targeted by this assay, and inadequate number of viral copies (<250 copies / mL). A negative result must be combined with clinical observations, patient history,  and epidemiological information.  Fact Sheet for Patients:   StrictlyIdeas.no  Fact Sheet for Healthcare Providers: BankingDealers.co.za  This test is not yet approved or  cleared by the [place] FDA and has been authorized for detection and/or diagnosis of SARS-CoV-2 by FDA under an Emergency Use Authorization (EUA).  This EUA will remain in effect (meaning this test can be used) for the duration of the COVID-19 declaration under Section 564(b)(1) of the Act, 21 U.S.C. section 360bbb-3(b)(1), unless the authorization is terminated or revoked sooner.  Performed at [facility], [address] [place], [place] [address]   Urine Culture     Status: Abnormal   Collection Time: 06/01/20  1:26 AM   Specimen: Urine, Random  Result Value Ref Range Status   Specimen Description   Final    URINE, RANDOM Performed at [facility], [address] [place], [place] [address]    Special Requests   Final    NONE Performed at [facility], [address] [place], [place] [address]    Culture (A)  Final    80,000 COLONIES/mL GROUP B STREP(S.AGALACTIAE)ISOLATED TESTING AGAINST S. AGALACTIAE NOT ROUTINELY PERFORMED DUE TO PREDICTABILITY OF AMP/PEN/VAN SUSCEPTIBILITY. Performed at [facility], [place] [address], [place], [place] [address]    Report Status 06/02/2020 FINAL  Final    Coagulation Studies: No results for input(s): LABPROT, INR in the last 72 hours.  Urinalysis: Recent Labs    06/01/20 0126  COLORURINE YELLOW*  LABSPEC 1.013  PHURINE 5.0  GLUCOSEU NEGATIVE  HGBUR NEGATIVE  BILIRUBINUR NEGATIVE  KETONESUR NEGATIVE  PROTEINUR NEGATIVE  NITRITE NEGATIVE  LEUKOCYTESUR MODERATE*      Imaging: DG Chest 2 View  Result Date: 05/31/2020 CLINICAL DATA:  Abnormal CT EXAM: CHEST - 2 VIEW COMPARISON:  CT 05/31/2020, chest x-ray 05/23/2020 FINDINGS: Tiny pleural effusions on lateral view. No  consolidation or effusion. Normal heart size. No pneumothorax. Aortic atherosclerosis. IMPRESSION: Tiny pleural effusion on lateral view. Electronically Signed   By: Donavan Foil M.D.   On: 05/31/2020 21:56   CT Renal Stone Study  Result Date: 05/31/2020 CLINICAL DATA:  Flank pain, kidney stone suspected. Worsening kidney function. Weakness for 2 weeks. White blood cell count 75. EXAM: CT ABDOMEN AND PELVIS WITHOUT CONTRAST TECHNIQUE: Multidetector CT imaging of the abdomen and pelvis was performed following the standard protocol without IV contrast. COMPARISON:  Radiograph earlier today. FINDINGS: Lower chest: Anterior lingular opacity abutting the  left heart border is only partially included in the field of view. There is additional streaky atelectasis in the lower lobes. Trace left pleural effusion. Small to moderate hiatal hernia. Coronary artery calcifications. There are small epicardial nodes. Hepatobiliary: No evidence of focal hepatic lesion on noncontrast exam. No calcified gallstone or pericholecystic inflammation. No biliary dilatation. Pancreas: No ductal dilatation or inflammation. Fullness in the region the pancreatic head which is adjacent to retroperitoneal fullness/adenopathy. This is not well assessed on noncontrast exam. Spleen: Normal in size. Greatest splenic dimension 12.6 cm. No focal abnormality on noncontrast exam. Adrenals/Urinary Tract: Bilateral adrenal thickening. No hydronephrosis. No renal or ureteral calculi. Urinary bladder is completely empty and not well assessed. Stomach/Bowel: Bowel evaluation is limited in the absence of contrast and presence of intra-abdominal ascites. Small to moderate hiatal hernia. Stomach is decompressed. There is no evidence of small-bowel obstruction. Equivocal wall thickening of the distal and terminal ileum versus nondistention. Normal appendix. Majority of the colon is nondistended and not well assessed. There is no colonic inflammation. Small  volume of stool in the sigmoid colon and rectum. Vascular/Lymphatic: Aortic atherosclerosis. No aortic aneurysm. Multifocal adenopathy throughout the abdomen and pelvis. Majority of the lymph nodes are 10-15 mm. There are enlarged retrocrural, retroperitoneal, mesenteric and omental nodes. Retroperitoneal adenopathy is difficult to delineate from the adjacent vascular structures. Representative left periaortic node at the level of the left kidney measures 14 mm, series 2, image 36. Representative ileocolic node measures 10 mm., series 2, image 55. Central mesenteric node measures 15 mm, series 2, image 48. There is no inguinal adenopathy. There is generalized mesenteric and omental edema and fat stranding. Reproductive: Uterus is grossly normal. Limited adnexal assessment. Other: Generalized mesenteric and omental edema and fat stranding. There is a small volume of abdominopelvic ascites. No free air. Mild subcutaneous edema of the anterior abdominal wall. Musculoskeletal: Multilevel degenerative change throughout the spine. Near complete disc space loss at L1-L2. No focal osseous abnormality. IMPRESSION: 1. No renal stones or obstructive uropathy. 2. Multifocal adenopathy throughout the abdomen and pelvis, including retrocrural, retroperitoneal, mesenteric, and omental enlarged nodes. Majority of these nodes measure 10-15 mm. In the setting of markedly elevated white blood cell count findings are suspicious for lymphoproliferative disorder. Metastatic disease is also considered. 3. Equivocal wall thickening of the distal and terminal ileum versus nondistention. 4. Generalized edema of the mesentery in the anterior omentum. Small volume abdominopelvic ascites. 5. Anterior lingular opacity abutting the left heart border is only partially included in the field of view. This is nonspecific in the setting. Consider completion imaging with chest CT. 6. Small to moderate hiatal hernia. Aortic Atherosclerosis  (ICD10-I70.0). Electronically Signed   By: Keith Rake M.D.   On: 05/31/2020 19:48     Medications:   . sodium chloride 125 mL/hr at 06/02/20 0710   . allopurinol  100 mg Oral Daily  . aspirin EC  81 mg Oral Daily  . heparin  5,000 Units Subcutaneous Q8H  . metoprolol tartrate  25 mg Oral BID  . pantoprazole  40 mg Oral Daily  . sodium polystyrene  15 g Oral Once   acetaminophen **OR** acetaminophen, alum & mag hydroxide-simeth, calcium carbonate, docusate sodium, guaiFENesin-dextromethorphan, ondansetron (ZOFRAN) IV, ondansetron, polyethylene glycol  Assessment/ Plan:  86 y.o. female with past medical history of aggressive chronic lymphocytic leukemia, diabetes mellitus type 2, hypertension, hyperlipidemia admitted with acute kidney injury, concerns for tumor lysis syndrome.  1.  Acute kidney injury likely multifactorial with contributions from dehydration,  possible tumor lysis syndrome, volume shifts.  Renal function does appear to be improving.  Continue 0.9 normal saline at 125 cc/h.  2.  Hyperkalemia.  Discontinue sodium polystyrene and transition to [place] for better GI tolerability.  3.  Hyperuricemia.  Likely related to tumor lysis.  Agree with allopurinol 100 mg daily.  Continue to monitor serum uric acid level.  If coming down no need for rasburicase at the moment.   LOS: 2 Makenley Shimp 7/10/20212:05 PM

## 2020-06-02 NOTE — Progress Notes (Signed)
Patient verbalizes increased pressure to abdomen area. She also complains of nausea. MD Billie Ruddy made aware. Orders in.

## 2020-06-02 NOTE — Progress Notes (Signed)
Hematology/Oncology Consult note [facility]  [phone] [phone]  Patient Care Team: Crecencio Mc, MD as PCP - General (Internal Medicine) Magrinat, Virgie Dad, MD as Consulting Physician (Oncology) Marry Guan Laurice Record, MD (Orthopedic Surgery)   Name of the patient: Haley Love  [id number]  02/22/1934   Date of visit:06/02/2020  Interval history- feels fatigued and nauseous. Has only been able to take sips of gingerale. Abdomen feels more distended   Review of systems- Review of Systems  Constitutional: Positive for malaise/fatigue and weight loss. Negative for chills and fever.  HENT: Negative for congestion, ear discharge and nosebleeds.   Eyes: Negative for blurred vision.  Respiratory: Negative for cough, hemoptysis, sputum production, shortness of breath and wheezing.   Cardiovascular: Negative for chest pain, palpitations, orthopnea and claudication.  Gastrointestinal: Positive for abdominal pain and nausea. Negative for blood in stool, constipation, diarrhea, heartburn, melena and vomiting.  Genitourinary: Negative for dysuria, flank pain, frequency, hematuria and urgency.  Musculoskeletal: Negative for back pain, joint pain and myalgias.  Skin: Negative for rash.  Neurological: Negative for dizziness, tingling, focal weakness, seizures, weakness and headaches.  Endo/Heme/Allergies: Does not bruise/bleed easily.  Psychiatric/Behavioral: Negative for depression and suicidal ideas. The patient does not have insomnia.       Allergies  Allergen Reactions  . No Known Allergies      Past Medical History:  Diagnosis Date  . Breast cancer ([place])   . Cataract   . Chickenpox   . Contact lens/glasses fitting   . Diabetes mellitus without complication ([place])   . Hyperlipidemia   . Hypertension   . Inflammatory polyps of colon [facility])      Past Surgical History:  Procedure Laterality Date  . BREAST LUMPECTOMY Left  07/28/2012  . BREAST SURGERY  9/13   lt lump  . cataracts  2014  . COLONOSCOPY WITH PROPOFOL N/A 01/05/2017   Procedure: COLONOSCOPY WITH PROPOFOL;  Surgeon: Manya Silvas, MD;  Location: [facility] ENDOSCOPY;  Service: Endoscopy;  Laterality: N/A;  . JOINT REPLACEMENT Right July 2015   [place], right knee  . KNEE ARTHROSCOPY W/ MENISCAL REPAIR  09/23/11   Right  . MASTECTOMY, PARTIAL  08/06/2012   Procedure: MASTECTOMY PARTIAL;  Surgeon: Adin Hector, MD;  Location: [facility];  Service: General;  Laterality: Left;  Left partial mastectomy with re-excision of margins    Social History   Socioeconomic History  . Marital status: Divorced    Spouse name: Not on file  . Number of children: Not on file  . Years of education: Not on file  . Highest education level: Not on file  Occupational History  . Not on file  Tobacco Use  . Smoking status: Never Smoker  . Smokeless tobacco: Never Used  Vaping Use  . Vaping Use: Never used  Substance and Sexual Activity  . Alcohol use: Yes    Comment: 1 glass wine week  . Drug use: No  . Sexual activity: Never    Birth control/protection: Post-menopausal  Other Topics Concern  . Not on file  Social History Narrative   Lives alone ,     Social Determinants of Health   Financial Resource Strain:   . Difficulty of Paying Living Expenses:   Food Insecurity:   . Worried About Charity fundraiser in the Last Year:   . Arboriculturist in the Last Year:   Transportation Needs:   . Lack of Transportation (  Medical):   Marland Kitchen Lack of Transportation (Non-Medical):   Physical Activity:   . Days of Exercise per Week:   . Minutes of Exercise per Session:   Stress:   . Feeling of Stress :   Social Connections:   . Frequency of Communication with Friends and Family:   . Frequency of Social Gatherings with Friends and Family:   . Attends Religious Services:   . Active Member of Clubs or Organizations:   . Attends Archivist  Meetings:   Marland Kitchen Marital Status:   Intimate Partner Violence:   . Fear of Current or Ex-Partner:   . Emotionally Abused:   Marland Kitchen Physically Abused:   . Sexually Abused:     Family History  Problem Relation Age of Onset  . Lung cancer Mother   . Arthritis Mother   . Hyperlipidemia Mother   . Hypertension Mother   . Throat cancer Father   . Hyperlipidemia Father   . Cancer Paternal Aunt        breast  . Dementia Brother   . Leukemia Brother        lymphatic /CLL     Current Facility-Administered Medications:  .  0.9 %  sodium chloride infusion, , Intravenous, Continuous, Enzo Bi, MD, Last Rate: 125 mL/hr at 06/02/20 0710, New Bag at 06/02/20 0710 .  acetaminophen (TYLENOL) tablet 650 mg, 650 mg, Oral, Q6H PRN, 650 mg at 06/02/20 1453 **OR** acetaminophen (TYLENOL) suppository 650 mg, 650 mg, Rectal, Q6H PRN, Nicolette Bang, DO .  allopurinol (ZYLOPRIM) tablet 100 mg, 100 mg, Oral, Daily, Enzo Bi, MD, 100 mg at 06/02/20 1117 .  alum & mag hydroxide-simeth (MAALOX/MYLANTA) 200-200-20 MG/5ML suspension 15 mL, 15 mL, Oral, Q6H PRN, Enzo Bi, MD, 15 mL at 06/02/20 0946 .  aspirin EC tablet 81 mg, 81 mg, Oral, Daily, Enzo Bi, MD, 81 mg at 06/01/20 1311 .  calcium carbonate (TUMS - dosed in mg elemental calcium) chewable tablet 200 mg of elemental calcium, 1 tablet, Oral, TID PRN, Enzo Bi, MD, 200 mg of elemental calcium at 06/02/20 1207 .  docusate sodium (COLACE) capsule 100 mg, 100 mg, Oral, BID PRN, Enzo Bi, MD .  guaiFENesin-dextromethorphan (ROBITUSSIN DM) 100-10 MG/5ML syrup 10 mL, 10 mL, Oral, Q6H PRN, Enzo Bi, MD .  heparin injection 5,000 Units, 5,000 Units, Subcutaneous, Q8H, Nicolette Bang, DO, 5,000 Units at 06/02/20 1454 .  metoprolol tartrate (LOPRESSOR) tablet 25 mg, 25 mg, Oral, BID, Nicolette Bang, DO, 25 mg at 06/01/20 2053 .  ondansetron (ZOFRAN) injection 4 mg, 4 mg, Intravenous, Q6H PRN, Enzo Bi, MD .  ondansetron (ZOFRAN-ODT)  disintegrating tablet 4 mg, 4 mg, Oral, Q8H PRN, Enzo Bi, MD, 4 mg at 06/02/20 0946 .  pantoprazole (PROTONIX) EC tablet 40 mg, 40 mg, Oral, Daily, Nicolette Bang, DO, 40 mg at 06/02/20 1116 .  patiromer Daryll Drown) packet 8.4 g, 8.4 g, Oral, Daily, Lateef, Munsoor, MD .  polyethylene glycol (MIRALAX / GLYCOLAX) packet 17 g, 17 g, Oral, BID PRN, Enzo Bi, MD .  prochlorperazine (COMPAZINE) injection 10 mg, 10 mg, Intravenous, Q6H PRN, Sindy Guadeloupe, MD  Physical exam:  Vitals:   06/02/20 0349 06/02/20 0735 06/02/20 1123 06/02/20 1249  BP: (!) 125/40  (!) 132/35 (!) 126/37  Pulse: 81 87 83 83  Resp: _0 Temp: 98.1 F (36.7 C)  98.5 F (36.9 C) 97.8 F (36.6 C)  TempSrc: Oral  Oral Oral  SpO2: 96% 97% 96%  96%  Weight:      Height:       Physical Exam Constitutional:      Comments: fatigued  Cardiovascular:     Rate and Rhythm: Tachycardia present.  Pulmonary:     Effort: Pulmonary effort is normal.  Abdominal:     Palpations: Abdomen is soft.     Comments: BS present but sluggish. Soft but distended more than yesterday  Skin:    General: Skin is warm and dry.  Neurological:     Mental Status: She is alert and oriented to person, place, and time.      CMP Latest Ref Rng & Units 06/02/2020  Glucose 70 - 99 mg/dL 66(L)  BUN 8 - 23 mg/dL 62(H)  Creatinine 0.44 - 1.00 mg/dL 2.39(H)  Sodium 135 - 145 mmol/L 130(L)  Potassium 3.5 - 5.1 mmol/L 5.2(H)  Chloride 98 - 111 mmol/L 102  CO2 22 - 32 mmol/L 21(L)  Calcium 8.9 - 10.3 mg/dL 11.4(H)  Total Protein 6.5 - 8.1 g/dL -  Total Bilirubin 0.3 - 1.2 mg/dL -  Alkaline Phos 38 - 126 U/L -  AST 15 - 41 U/L -  ALT 0 - 44 U/L -   CBC Latest Ref Rng & Units 06/02/2020  WBC 4.0 - 10.5 K/uL 89.9(HH)  Hemoglobin 12.0 - 15.0 g/dL 11.0(L)  Hematocrit 36 - 46 % 32.2(L)  Platelets 150 - 400 K/uL 219    _0 @  DG Chest 2 View  Result Date: 05/31/2020 CLINICAL DATA:  Abnormal CT EXAM: CHEST - 2 VIEW  COMPARISON:  CT 05/31/2020, chest x-ray 05/23/2020 FINDINGS: Tiny pleural effusions on lateral view. No consolidation or effusion. Normal heart size. No pneumothorax. Aortic atherosclerosis. IMPRESSION: Tiny pleural effusion on lateral view. Electronically Signed   By: Donavan Foil M.D.   On: 05/31/2020 21:56   DG Chest 2 View  Result Date: 05/23/2020 CLINICAL DATA:  Chest burning.  Chest pain. EXAM: CHEST - 2 VIEW COMPARISON:  Radiograph 06/23/2013 FINDINGS: The cardiomediastinal contours are normal. The lungs are clear. Aortic atherosclerosis. Pulmonary vasculature is normal. No consolidation, pleural effusion, or pneumothorax. No acute osseous abnormalities are seen. Degenerative change in the thoracic spine. Surgical clips project over the left breast/chest wall. IMPRESSION: No acute chest findings. Electronically Signed   By: Keith Rake M.D.   On: 05/23/2020 04:16   DG Abd 1 View  Result Date: 05/31/2020 CLINICAL DATA:  Lower abdominal pain. EXAM: ABDOMEN - 1 VIEW COMPARISON:  Subsequent abdominal CT, available at time of radiograph interpretation. FINDINGS: No bowel dilatation to suggest obstruction. Small volume of colonic stool. No evidence of free air. No radiopaque calculi. Right pelvic phlebolith. Aortic atherosclerosis. Scoliosis and degenerative change in the spine. IMPRESSION: Normal bowel gas pattern. Electronically Signed   By: Keith Rake M.D.   On: 05/31/2020 19:49   CT Renal Stone Study  Result Date: 05/31/2020 CLINICAL DATA:  Flank pain, kidney stone suspected. Worsening kidney function. Weakness for 2 weeks. White blood cell count 75. EXAM: CT ABDOMEN AND PELVIS WITHOUT CONTRAST TECHNIQUE: Multidetector CT imaging of the abdomen and pelvis was performed following the standard protocol without IV contrast. COMPARISON:  Radiograph earlier today. FINDINGS: Lower chest: Anterior lingular opacity abutting the left heart border is only partially included in the field of view.  There is additional streaky atelectasis in the lower lobes. Trace left pleural effusion. Small to moderate hiatal hernia. Coronary artery calcifications. There are small epicardial nodes. Hepatobiliary: No evidence of focal hepatic lesion on  noncontrast exam. No calcified gallstone or pericholecystic inflammation. No biliary dilatation. Pancreas: No ductal dilatation or inflammation. Fullness in the region the pancreatic head which is adjacent to retroperitoneal fullness/adenopathy. This is not well assessed on noncontrast exam. Spleen: Normal in size. Greatest splenic dimension 12.6 cm. No focal abnormality on noncontrast exam. Adrenals/Urinary Tract: Bilateral adrenal thickening. No hydronephrosis. No renal or ureteral calculi. Urinary bladder is completely empty and not well assessed. Stomach/Bowel: Bowel evaluation is limited in the absence of contrast and presence of intra-abdominal ascites. Small to moderate hiatal hernia. Stomach is decompressed. There is no evidence of small-bowel obstruction. Equivocal wall thickening of the distal and terminal ileum versus nondistention. Normal appendix. Majority of the colon is nondistended and not well assessed. There is no colonic inflammation. Small volume of stool in the sigmoid colon and rectum. Vascular/Lymphatic: Aortic atherosclerosis. No aortic aneurysm. Multifocal adenopathy throughout the abdomen and pelvis. Majority of the lymph nodes are 10-15 mm. There are enlarged retrocrural, retroperitoneal, mesenteric and omental nodes. Retroperitoneal adenopathy is difficult to delineate from the adjacent vascular structures. Representative left periaortic node at the level of the left kidney measures 14 mm, series 2, image 36. Representative ileocolic node measures 10 mm., series 2, image 55. Central mesenteric node measures 15 mm, series 2, image 48. There is no inguinal adenopathy. There is generalized mesenteric and omental edema and fat stranding. Reproductive:  Uterus is grossly normal. Limited adnexal assessment. Other: Generalized mesenteric and omental edema and fat stranding. There is a small volume of abdominopelvic ascites. No free air. Mild subcutaneous edema of the anterior abdominal wall. Musculoskeletal: Multilevel degenerative change throughout the spine. Near complete disc space loss at L1-L2. No focal osseous abnormality. IMPRESSION: 1. No renal stones or obstructive uropathy. 2. Multifocal adenopathy throughout the abdomen and pelvis, including retrocrural, retroperitoneal, mesenteric, and omental enlarged nodes. Majority of these nodes measure 10-15 mm. In the setting of markedly elevated white blood cell count findings are suspicious for lymphoproliferative disorder. Metastatic disease is also considered. 3. Equivocal wall thickening of the distal and terminal ileum versus nondistention. 4. Generalized edema of the mesentery in the anterior omentum. Small volume abdominopelvic ascites. 5. Anterior lingular opacity abutting the left heart border is only partially included in the field of view. This is nonspecific in the setting. Consider completion imaging with chest CT. 6. Small to moderate hiatal hernia. Aortic Atherosclerosis (ICD10-I70.0). Electronically Signed   By: Keith Rake M.D.   On: 05/31/2020 19:48     Assessment and plan- Patient is a 86 y.o. female with h/o T cell pro lymphocytic leukemia now presenting with AKI and B symptoms  1. T cell pro lymphocytic leukemia- On smear review by pathology- no blastic transformation. He wbc count which typically runs in the 30's has increased to 80-90 in the last 2 weeks. Worsening B symptoms and elevated LDH. This is all concerning for progression of her existing T cell leukemia.   This does not lead to blast crisis. However it is a rare and aggressive leukemia with a median survival of few months without treatment.  I have spoken to Dr. Lonia Blood leukemia service at [facility]. Transfer process has  been initiated and accepted by [facility]. Patient is currently awaiting a bed. It remains to be seen if she would be a treatment candidate with agents like campath. She has B symptoms but no splenomegaly, liver or skin involvement. Wbc is high but mild anemia and no thrombocytopenia. She is mildly tachycardic but hemodynamically stable  2. AKI- possibly  due to spontatneous TLS along with dehydration. Kidney numbers are improving. She is on 125 cc/hr. Nephrology on board. On allopurinol. g6pd levels pending. Holding off on rasburucase. Uric acid stable around 9.8  3. Abdominal distension- CT abdomen done on admission showed diffuse intraabdominal non bulky adenopathy which would be expected in this leukemia. No other acute bowel pathology. Omental and mesentric edema.I have added prn compazine.  Patient however continues to be nauseous, not taking much PO. I repeat abdominal Xray today which shows no SBO. If abdominal symptoms are worse tomorrow- consider another non contrast CT  I met with patient and her daughter/son at bedside. We are hoping she can be transferred to [facility] by tomorrow where she can weigh her treatment options     Visit Diagnosis 1. CLL (chronic lymphocytic leukemia) ([place])   2. AKI (acute kidney injury) ([place])   3. Abnormal findings on diagnostic imaging of abdomen   4. Acute renal failure superimposed on stage 4 chronic kidney disease, unspecified acute renal failure type ([place])   5. T-cell chronic lymphocytic leukemia ([facility])   6. Abdominal distension      Dr. Randa Evens, MD, MPH [facility] [phone] 06/02/2020 4:05 PM

## 2020-06-02 NOTE — Progress Notes (Signed)
PT Cancellation Note  Patient Details Name: Haley Love MRN: [id number] DOB: December 25, 1933   Cancelled Treatment:    Reason Eval/Treat Not Completed:  (Consult received and chart reviewed. Patient currently declining participation with session, "I just don't have the energy" and "trying to rest".  Daughter at bedside in agreement with patient request.  Will continue efforts next date, though patient/daughter indicate potential plans for transfer?)   Jacques Willingham H. Owens Shark, PT, DPT, NCS 06/02/20, 2:04 PM [phone]

## 2020-06-03 LAB — BASIC METABOLIC PANEL
Anion gap: 5 (ref 5–15)
BUN: 59 mg/dL — ABNORMAL HIGH (ref 8–23)
CO2: 19 mmol/L — ABNORMAL LOW (ref 22–32)
Calcium: 12.4 mg/dL — ABNORMAL HIGH (ref 8.9–10.3)
Chloride: 103 mmol/L (ref 98–111)
Creatinine, Ser: 2.15 mg/dL — ABNORMAL HIGH (ref 0.44–1.00)
GFR calc Af Amer: 24 mL/min — ABNORMAL LOW (ref >60)
GFR calc non Af Amer: 21 mL/min — ABNORMAL LOW (ref >60)
Glucose, Bld: 141 mg/dL — ABNORMAL HIGH (ref 70–99)
Potassium: 5.4 mmol/L — ABNORMAL HIGH (ref 3.5–5.1)
Sodium: 127 mmol/L — ABNORMAL LOW (ref 135–145)

## 2020-06-03 LAB — CBC
HCT: 31.9 % — ABNORMAL LOW (ref 36.0–46.0)
Hemoglobin: 10.8 g/dL — ABNORMAL LOW (ref 12.0–15.0)
MCH: 30.4 pg (ref 26.0–34.0)
MCHC: 33.9 g/dL (ref 30.0–36.0)
MCV: 89.9 fL (ref 80.0–100.0)
Platelets: 219 10*3/uL (ref 150–400)
RBC: 3.55 MIL/uL — ABNORMAL LOW (ref 3.87–5.11)
RDW: 13.8 % (ref 11.5–15.5)
WBC: 85.4 10*3/uL (ref 4.0–10.5)
nRBC: 0 % (ref 0.0–0.2)

## 2020-06-03 LAB — HEPATITIS B SURFACE ANTIGEN: Hepatitis B Surface Ag: NONREACTIVE

## 2020-06-03 LAB — GLUCOSE, CAPILLARY
Glucose-Capillary: 111 mg/dL — ABNORMAL HIGH (ref 70–99)
Glucose-Capillary: 117 mg/dL — ABNORMAL HIGH (ref 70–99)
Glucose-Capillary: 119 mg/dL — ABNORMAL HIGH (ref 70–99)
Glucose-Capillary: 122 mg/dL — ABNORMAL HIGH (ref 70–99)
Glucose-Capillary: 137 mg/dL — ABNORMAL HIGH (ref 70–99)
Glucose-Capillary: 99 mg/dL (ref 70–99)

## 2020-06-03 LAB — HSV(HERPES SIMPLEX VRS) I + II AB-IGG
HSV 1 Glycoprotein G Ab, IgG: <0.91 index (ref 0.00–0.90)
HSV 2 Glycoprotein G Ab, IgG: <0.91 index (ref 0.00–0.90)

## 2020-06-03 LAB — CMV ANTIBODY, IGG (EIA): CMV Ab - IgG: <0.6 U/mL (ref 0.00–0.59)

## 2020-06-03 LAB — MAGNESIUM: Magnesium: 2 mg/dL (ref 1.7–2.4)

## 2020-06-03 LAB — CMV IGM: CMV IgM: <30 AU/mL (ref 0.0–29.9)

## 2020-06-03 MED ORDER — SODIUM CHLORIDE 0.9 % IV SOLN
60.0000 mg | Freq: Once | INTRAVENOUS | Status: AC
  Administered 2020-06-03: 60 mg via INTRAVENOUS
  Filled 2020-06-03: qty 20

## 2020-06-03 MED ORDER — BOOST / RESOURCE BREEZE PO LIQD CUSTOM
1.0000 | Freq: Three times a day (TID) | ORAL | Status: DC
  Administered 2020-06-03 (×2): 1 via ORAL

## 2020-06-03 NOTE — Progress Notes (Signed)
Call from Dr. Fritzi Mandes regarding treatment for hypercalcemia. Patient's CrCl = 20.3 . For low albumin, corrected calcium~13. Most treatments for hypercalcemia with renal impairment (CrCl <30) are contraindicated or are not stocked for inpatient use. It was decided that pamidronate at a lower dose (60mg ) over 4 hours was the best option and this was ordered for this patient.

## 2020-06-03 NOTE — Progress Notes (Signed)
Hematology/Oncology Consult note [facility]  [phone] [phone]  Patient Care Team: Crecencio Mc, MD as PCP - General (Internal Medicine) Magrinat, Virgie Dad, MD as Consulting Physician (Oncology) Marry Guan Laurice Record, MD (Orthopedic Surgery)   Name of the patient: Haley Love  [id number]  June 08, 1934   Date of visit: 06/03/2020   Interval history- Patient reports feeling fatigued and has been sleeping most of the day. Still reports abdominal discomfort. Hardly taking anything PO. Feels nauseous  ECOG PS- 3   Review of systems- Review of Systems  Constitutional: Positive for malaise/fatigue and weight loss. Negative for chills and fever.  HENT: Negative for congestion, ear discharge and nosebleeds.   Eyes: Negative for blurred vision.  Respiratory: Negative for cough, hemoptysis, sputum production, shortness of breath and wheezing.   Cardiovascular: Negative for chest pain, palpitations, orthopnea and claudication.  Gastrointestinal: Positive for abdominal pain and nausea. Negative for blood in stool, constipation, diarrhea, heartburn, melena and vomiting.  Genitourinary: Negative for dysuria, flank pain, frequency, hematuria and urgency.  Musculoskeletal: Negative for back pain, joint pain and myalgias.  Skin: Negative for rash.  Neurological: Negative for dizziness, tingling, focal weakness, seizures, weakness and headaches.  Endo/Heme/Allergies: Does not bruise/bleed easily.  Psychiatric/Behavioral: Negative for depression and suicidal ideas. The patient does not have insomnia.       Allergies  Allergen Reactions  . No Known Allergies      Past Medical History:  Diagnosis Date  . Breast cancer ([place])   . Cataract   . Chickenpox   . Contact lens/glasses fitting   . Diabetes mellitus without complication ([place])   . Hyperlipidemia   . Hypertension   . Inflammatory polyps of colon [facility])      Past Surgical History:    Procedure Laterality Date  . BREAST LUMPECTOMY Left 07/28/2012  . BREAST SURGERY  9/13   lt lump  . cataracts  2014  . COLONOSCOPY WITH PROPOFOL N/A 01/05/2017   Procedure: COLONOSCOPY WITH PROPOFOL;  Surgeon: Manya Silvas, MD;  Location: [facility] ENDOSCOPY;  Service: Endoscopy;  Laterality: N/A;  . JOINT REPLACEMENT Right July 2015   [place], right knee  . KNEE ARTHROSCOPY W/ MENISCAL REPAIR  09/23/11   Right  . MASTECTOMY, PARTIAL  08/06/2012   Procedure: MASTECTOMY PARTIAL;  Surgeon: Adin Hector, MD;  Location: [facility];  Service: General;  Laterality: Left;  Left partial mastectomy with re-excision of margins    Social History   Socioeconomic History  . Marital status: Divorced    Spouse name: Not on file  . Number of children: Not on file  . Years of education: Not on file  . Highest education level: Not on file  Occupational History  . Not on file  Tobacco Use  . Smoking status: Never Smoker  . Smokeless tobacco: Never Used  Vaping Use  . Vaping Use: Never used  Substance and Sexual Activity  . Alcohol use: Yes    Comment: 1 glass wine week  . Drug use: No  . Sexual activity: Never    Birth control/protection: Post-menopausal  Other Topics Concern  . Not on file  Social History Narrative   Lives alone ,     Social Determinants of Health   Financial Resource Strain:   . Difficulty of Paying Living Expenses:   Food Insecurity:   . Worried About Charity fundraiser in the Last Year:   . [place] in the  Last Year:   Transportation Needs:   . Film/video editor (Medical):   Marland Kitchen Lack of Transportation (Non-Medical):   Physical Activity:   . Days of Exercise per Week:   . Minutes of Exercise per Session:   Stress:   . Feeling of Stress :   Social Connections:   . Frequency of Communication with Friends and Family:   . Frequency of Social Gatherings with Friends and Family:   . Attends Religious Services:   . Active Member of  Clubs or Organizations:   . Attends Archivist Meetings:   Marland Kitchen Marital Status:   Intimate Partner Violence:   . Fear of Current or Ex-Partner:   . Emotionally Abused:   Marland Kitchen Physically Abused:   . Sexually Abused:     Family History  Problem Relation Age of Onset  . Lung cancer Mother   . Arthritis Mother   . Hyperlipidemia Mother   . Hypertension Mother   . Throat cancer Father   . Hyperlipidemia Father   . Cancer Paternal Aunt        breast  . Dementia Brother   . Leukemia Brother        lymphatic /CLL     Current Facility-Administered Medications:  .  0.9 %  sodium chloride infusion, , Intravenous, Continuous, Enzo Bi, MD, Stopped at 06/03/20 1539 .  acetaminophen (TYLENOL) tablet 650 mg, 650 mg, Oral, Q6H PRN, 650 mg at 06/03/20 0709 **OR** acetaminophen (TYLENOL) suppository 650 mg, 650 mg, Rectal, Q6H PRN, Nicolette Bang, DO .  allopurinol (ZYLOPRIM) tablet 100 mg, 100 mg, Oral, Daily, Enzo Bi, MD, 100 mg at 06/03/20 0804 .  alum & mag hydroxide-simeth (MAALOX/MYLANTA) 200-200-20 MG/5ML suspension 15 mL, 15 mL, Oral, Q6H PRN, Enzo Bi, MD, 15 mL at 06/02/20 0946 .  aspirin EC tablet 81 mg, 81 mg, Oral, Daily, Enzo Bi, MD, 81 mg at 06/03/20 0804 .  docusate sodium (COLACE) capsule 100 mg, 100 mg, Oral, BID PRN, Enzo Bi, MD, 100 mg at 06/02/20 1724 .  feeding supplement (BOOST / RESOURCE BREEZE) liquid 1 Container, 1 Container, Oral, TID BM, Fritzi Mandes, MD, 1 Container at 06/03/20 1543 .  guaiFENesin-dextromethorphan (ROBITUSSIN DM) 100-10 MG/5ML syrup 10 mL, 10 mL, Oral, Q6H PRN, Enzo Bi, MD .  heparin injection 5,000 Units, 5,000 Units, Subcutaneous, Q8H, Nicolette Bang, DO, 5,000 Units at 06/03/20 1335 .  metoprolol tartrate (LOPRESSOR) tablet 25 mg, 25 mg, Oral, BID, Nicolette Bang, DO, 25 mg at 06/03/20 0804 .  ondansetron (ZOFRAN) injection 4 mg, 4 mg, Intravenous, Q6H PRN, Enzo Bi, MD .  ondansetron (ZOFRAN-ODT)  disintegrating tablet 4 mg, 4 mg, Oral, Q8H PRN, Enzo Bi, MD, 4 mg at 06/02/20 0946 .  pamidronate (AREDIA) 60 mg in sodium chloride 0.9 % 500 mL IVPB, 60 mg, Intravenous, Once, Fritzi Mandes, MD, Last Rate: 130 mL/hr at 06/03/20 1600, Rate Verify at 06/03/20 1600 .  pantoprazole (PROTONIX) EC tablet 40 mg, 40 mg, Oral, Daily, Nicolette Bang, DO, 40 mg at 06/03/20 0804 .  patiromer Daryll Drown) packet 8.4 g, 8.4 g, Oral, Daily, Lateef, Munsoor, MD, 8.4 g at 06/03/20 0804 .  polyethylene glycol (MIRALAX / GLYCOLAX) packet 17 g, 17 g, Oral, BID PRN, Enzo Bi, MD .  prochlorperazine (COMPAZINE) injection 10 mg, 10 mg, Intravenous, Q6H PRN, Sindy Guadeloupe, MD, 10 mg at 06/02/20 1717  Physical exam:  Vitals:   06/02/20 1939 06/03/20 0346 06/03/20 0759 06/03/20 1318  BP: (!) 156/51 Marland Kitchen)  126/47 (!) 134/47 (!) 123/57  Pulse: 98 82 84 78  Resp: 20 18    Temp: 98.2 F (36.8 C) 97.9 F (36.6 C)  (!) 97.5 F (36.4 C)  TempSrc: Oral Oral    SpO2: 100% 93%  93%  Weight:      Height:       Physical Exam Constitutional:      Comments: Feels sleepy but able to understand and responsive to verbal commands  Cardiovascular:     Rate and Rhythm: Tachycardia present.  Pulmonary:     Effort: Pulmonary effort is normal.     Breath sounds: Normal breath sounds.  Abdominal:     Palpations: Abdomen is soft.     Comments: Mildly distended  Musculoskeletal:     Right lower leg: No edema.     Left lower leg: No edema.  Skin:    General: Skin is warm and dry.  Neurological:     Mental Status: She is oriented to person, place, and time.      CMP Latest Ref Rng & Units 06/03/2020  Glucose 70 - 99 mg/dL 141(H)  BUN 8 - 23 mg/dL 59(H)  Creatinine 0.44 - 1.00 mg/dL 2.15(H)  Sodium 135 - 145 mmol/L 127(L)  Potassium 3.5 - 5.1 mmol/L 5.4(H)  Chloride 98 - 111 mmol/L 103  CO2 22 - 32 mmol/L 19(L)  Calcium 8.9 - 10.3 mg/dL 12.4(H)  Total Protein 6.5 - 8.1 g/dL -  Total Bilirubin 0.3 - 1.2 mg/dL  -  Alkaline Phos 38 - 126 U/L -  AST 15 - 41 U/L -  ALT 0 - 44 U/L -   CBC Latest Ref Rng & Units 06/03/2020  WBC 4.0 - 10.5 K/uL 85.4(HH)  Hemoglobin 12.0 - 15.0 g/dL 10.8(L)  Hematocrit 36 - 46 % 31.9(L)  Platelets 150 - 400 K/uL 219    @IMAGES @  DG Chest 2 View  Result Date: 05/31/2020 CLINICAL DATA:  Abnormal CT EXAM: CHEST - 2 VIEW COMPARISON:  CT 05/31/2020, chest x-ray 05/23/2020 FINDINGS: Tiny pleural effusions on lateral view. No consolidation or effusion. Normal heart size. No pneumothorax. Aortic atherosclerosis. IMPRESSION: Tiny pleural effusion on lateral view. Electronically Signed   By: Donavan Foil M.D.   On: 05/31/2020 21:56   DG Chest 2 View  Result Date: 05/23/2020 CLINICAL DATA:  Chest burning.  Chest pain. EXAM: CHEST - 2 VIEW COMPARISON:  Radiograph 06/23/2013 FINDINGS: The cardiomediastinal contours are normal. The lungs are clear. Aortic atherosclerosis. Pulmonary vasculature is normal. No consolidation, pleural effusion, or pneumothorax. No acute osseous abnormalities are seen. Degenerative change in the thoracic spine. Surgical clips project over the left breast/chest wall. IMPRESSION: No acute chest findings. Electronically Signed   By: Keith Rake M.D.   On: 05/23/2020 04:16   DG Abd 1 View  Result Date: 05/31/2020 CLINICAL DATA:  Lower abdominal pain. EXAM: ABDOMEN - 1 VIEW COMPARISON:  Subsequent abdominal CT, available at time of radiograph interpretation. FINDINGS: No bowel dilatation to suggest obstruction. Small volume of colonic stool. No evidence of free air. No radiopaque calculi. Right pelvic phlebolith. Aortic atherosclerosis. Scoliosis and degenerative change in the spine. IMPRESSION: Normal bowel gas pattern. Electronically Signed   By: Keith Rake M.D.   On: 05/31/2020 19:49   DG Abd 2 Views  Result Date: 06/02/2020 CLINICAL DATA:  Abdominal distension, weakness, CLL EXAM: ABDOMEN - 2 VIEW COMPARISON:  05/31/2020 FINDINGS: Supine and  upright frontal views of the abdomen and pelvis demonstrate unremarkable bowel gas pattern.  No masses or abnormal calcifications. No free gas within the greater peritoneal sac. Stable atherosclerosis. IMPRESSION: 1. Unremarkable bowel gas pattern. Electronically Signed   By: Randa Ngo M.D.   On: 06/02/2020 19:22   CT Renal Stone Study  Result Date: 05/31/2020 CLINICAL DATA:  Flank pain, kidney stone suspected. Worsening kidney function. Weakness for 2 weeks. White blood cell count 75. EXAM: CT ABDOMEN AND PELVIS WITHOUT CONTRAST TECHNIQUE: Multidetector CT imaging of the abdomen and pelvis was performed following the standard protocol without IV contrast. COMPARISON:  Radiograph earlier today. FINDINGS: Lower chest: Anterior lingular opacity abutting the left heart border is only partially included in the field of view. There is additional streaky atelectasis in the lower lobes. Trace left pleural effusion. Small to moderate hiatal hernia. Coronary artery calcifications. There are small epicardial nodes. Hepatobiliary: No evidence of focal hepatic lesion on noncontrast exam. No calcified gallstone or pericholecystic inflammation. No biliary dilatation. Pancreas: No ductal dilatation or inflammation. Fullness in the region the pancreatic head which is adjacent to retroperitoneal fullness/adenopathy. This is not well assessed on noncontrast exam. Spleen: Normal in size. Greatest splenic dimension 12.6 cm. No focal abnormality on noncontrast exam. Adrenals/Urinary Tract: Bilateral adrenal thickening. No hydronephrosis. No renal or ureteral calculi. Urinary bladder is completely empty and not well assessed. Stomach/Bowel: Bowel evaluation is limited in the absence of contrast and presence of intra-abdominal ascites. Small to moderate hiatal hernia. Stomach is decompressed. There is no evidence of small-bowel obstruction. Equivocal wall thickening of the distal and terminal ileum versus nondistention. Normal  appendix. Majority of the colon is nondistended and not well assessed. There is no colonic inflammation. Small volume of stool in the sigmoid colon and rectum. Vascular/Lymphatic: Aortic atherosclerosis. No aortic aneurysm. Multifocal adenopathy throughout the abdomen and pelvis. Majority of the lymph nodes are 10-15 mm. There are enlarged retrocrural, retroperitoneal, mesenteric and omental nodes. Retroperitoneal adenopathy is difficult to delineate from the adjacent vascular structures. Representative left periaortic node at the level of the left kidney measures 14 mm, series 2, image 36. Representative ileocolic node measures 10 mm., series 2, image 55. Central mesenteric node measures 15 mm, series 2, image 48. There is no inguinal adenopathy. There is generalized mesenteric and omental edema and fat stranding. Reproductive: Uterus is grossly normal. Limited adnexal assessment. Other: Generalized mesenteric and omental edema and fat stranding. There is a small volume of abdominopelvic ascites. No free air. Mild subcutaneous edema of the anterior abdominal wall. Musculoskeletal: Multilevel degenerative change throughout the spine. Near complete disc space loss at L1-L2. No focal osseous abnormality. IMPRESSION: 1. No renal stones or obstructive uropathy. 2. Multifocal adenopathy throughout the abdomen and pelvis, including retrocrural, retroperitoneal, mesenteric, and omental enlarged nodes. Majority of these nodes measure 10-15 mm. In the setting of markedly elevated white blood cell count findings are suspicious for lymphoproliferative disorder. Metastatic disease is also considered. 3. Equivocal wall thickening of the distal and terminal ileum versus nondistention. 4. Generalized edema of the mesentery in the anterior omentum. Small volume abdominopelvic ascites. 5. Anterior lingular opacity abutting the left heart border is only partially included in the field of view. This is nonspecific in the setting.  Consider completion imaging with chest CT. 6. Small to moderate hiatal hernia. Aortic Atherosclerosis (ICD10-I70.0). Electronically Signed   By: Keith Rake M.D.   On: 05/31/2020 19:48     Assessment and plan- Patient is a 86 y.o. female with chronic T cell pro lymphocytic leukemia admitted with B symptoms, AKI  1. AKI-  creatinine improving. Remains hypercalcemia and mildly hyperkalemic. On 125 cc/hr of fluids. Nephrology on board. Will add uric acid in AM. On allopurinol. Holding off on rasburicase until g6PD is back  2. Hypercalcemia- possibly due to dehydration and underlying leukemia. Not responded to IVF yet. Got pamidronate today. Continue to monitor  3. T cell pro lymphocytic leukemia- awaiting transfer to [facility]. Patient and her daughter understand that this carries a poor prognosis overall. It remains to be seen if she would be a treatment candidate. If she does not get a bed at [facility] by tomorrow, other options including [facility] may need to be considered  I have discussed CODE status with the patient. She understands her poor prognosis and wishes to be DNR/DNI. CODE status has been updated. Her daughter has witnessed the conversation and agreeable with patients decision  4. Abdominal distension- likely secondary to omental/ mesenteric ischemia in the setting of lymphadenopathy from underlying leukemia. No other pathology identified on CT. She remains distended but probably a little better than yesterday. No BM in 24 hours. Hopefully she should have one after hypercalcemia is corrected.     Visit Diagnosis 1. CLL (chronic lymphocytic leukemia) ([place])   2. AKI (acute kidney injury) ([place])   3. Abnormal findings on diagnostic imaging of abdomen   4. Acute renal failure superimposed on stage 4 chronic kidney disease, unspecified acute renal failure type ([place])   5. T-cell chronic lymphocytic leukemia ([facility])   6. Abdominal distension      Dr. Randa Evens, MD, MPH [facility] at  [facility] [phone] 06/03/2020 6:31 PM

## 2020-06-03 NOTE — Progress Notes (Signed)
[facility] [place] NAME: Haley Love    MR#:  [id number]  DATE OF BIRTH:  [birth year]  SUBJECTIVE:   Daughter and son in the room. Patient feels very weak. Extremely poor appetite. Just drinking sips of water. Per daughter resting well today. Feels her abdomen is bloated  REVIEW OF SYSTEMS:   Review of Systems  Constitutional: Positive for malaise/fatigue. Negative for chills, fever and weight loss.  HENT: Negative for ear discharge, ear pain and nosebleeds.   Eyes: Negative for blurred vision, pain and discharge.  Respiratory: Negative for sputum production, shortness of breath, wheezing and stridor.   Cardiovascular: Negative for chest pain, palpitations, orthopnea and PND.  Gastrointestinal: Positive for abdominal pain and nausea. Negative for diarrhea and vomiting.  Genitourinary: Negative for frequency and urgency.  Musculoskeletal: Negative for back pain and joint pain.  Neurological: Positive for weakness. Negative for sensory change, speech change and focal weakness.  Psychiatric/Behavioral: Negative for depression and hallucinations. The patient is not nervous/anxious.    Tolerating Diet:no Tolerating PT:   DRUG ALLERGIES:   Allergies  Allergen Reactions  . No Known Allergies     VITALS:  Blood pressure (!) 123/57, pulse 78, temperature (!) 97.5 F (36.4 C), resp. rate 18, height 5\' 5"  (1.651 m), weight 77 kg, SpO2 93 %.  PHYSICAL EXAMINATION:   Physical Exam  GENERAL:  86 y.o.-year-old patient lying in the bed with no acute distress. Appears ill and weak EYES: Pupils equal, round, reactive to light and accommodation. No scleral icterus.   HEENT: Head atraumatic, normocephalic. Oropharynx and nasopharynx clear.  NECK:  Supple, no jugular venous distention. No thyroid enlargement, no tenderness.  LUNGS: Normal breath sounds bilaterally, no wheezing, rales, rhonchi. No use of accessory muscles of respiration.   CARDIOVASCULAR: S1, S2 normal. No murmurs, rubs, or gallops.  ABDOMEN: Soft, nontender, +distended. Bowel sounds present. No organomegaly or mass.  EXTREMITIES: No cyanosis, clubbing or edema b/l.    NEUROLOGIC: Cranial nerves II through XII are intact. No focal Motor or sensory deficits b/l.   PSYCHIATRIC:  patient is alert and oriented x 3.  SKIN: No obvious rash, lesion, or ulcer.   LABORATORY PANEL:  CBC Recent Labs  Lab 06/03/20 0736  WBC 85.4*  HGB 10.8*  HCT 31.9*  PLT 219    Chemistries  Recent Labs  Lab 05/31/20 1810 06/01/20 0623 06/02/20 1710 06/03/20 0736 06/03/20 1144  NA  --    < >   < >  --  127*  K  --    < >   < >  --  5.4*  CL  --    < >   < >  --  103  CO2  --    < >   < >  --  19*  GLUCOSE  --    < >   < >  --  141*  BUN  --    < >   < >  --  59*  CREATININE  --    < >   < >  --  2.15*  CALCIUM  --    < >   < >  --  12.4*  MG  --    < >  --  2.0  --   AST 43*  --   --   --   --   ALT 28  --   --   --   --  ALKPHOS 75  --   --   --   --   BILITOT 1.1  --   --   --   --    < > = values in this interval not displayed.   Cardiac Enzymes No results for input(s): TROPONINI in the last 168 hours. RADIOLOGY:  DG Abd 2 Views  Result Date: 06/02/2020 CLINICAL DATA:  Abdominal distension, weakness, CLL EXAM: ABDOMEN - 2 VIEW COMPARISON:  05/31/2020 FINDINGS: Supine and upright frontal views of the abdomen and pelvis demonstrate unremarkable bowel gas pattern. No masses or abnormal calcifications. No free gas within the greater peritoneal sac. Stable atherosclerosis. IMPRESSION: 1. Unremarkable bowel gas pattern. Electronically Signed   By: Randa Ngo M.D.   On: 06/02/2020 19:22   ASSESSMENT AND PLAN:   Jakari Jacot [id number] y.o.Caucasian female with hx of CLL, DM, HTN, HLD who presented to ED per PCP recommendation for Cr 3.38 and WBC 75. Patient endorsing intermittent generalized weakness for the past week.  Pt was seen in ED on 6/30 for similar  complaints. No improvement since then.  AKI  Hyperkalemia 2/2 AKI -Cr 3.92. BUN 61--IVF--2.15 -good uop in the last 24 hours - K 5.2. - Baseline Cr appears to be around 1.1.  - CT renal study negative for stones, hydronephrosis, urinary obstruction.  Bladder scan did not show increased PVR.  - AKI suspect related to dehydration from poor PO intake along with use of thiazide diuretic and ARB. Denied NSAID use.  -cont IVF at 125 ml/hr.   -Nephrology consult-- recommended continuing IVF, and starting Veltassa for mild hyperkalemia.    Concern for tumor lysis syndrome Leukocytosis with history of T cell CLL  Hyperuricemia -Recent WBC trend 54>75>88. - CT renal study showed multifocal adenopathy which is most likely consistent with lymphoproliferative disorder.   -Oncology input from Dr Rao--recommends transfer to [facility].  -PS--no Blast crisis -  Subsequent labs showedLDH increased to 1247 from 436 about 2 weeks ago, uric acid 9.8, and K elevated, concerning for mild tumor lysis syndrome.   -Pt was continued on allopurinol 100 mg daily and IVF@125  ml/hr.  - After discussion between Oncology Dr. Janese Banks and pt and family, decision was made to transfer to leukemia service in [facility] for more treatment options--awaiting bed availability. Per Dr Janese Banks family aware that pt may deteriorate.pt is DNR per Dr Janese Banks -poor prognosis  Hypercalcemia -d/w pharmacy--give IV pamidronate  HTN  Systolic wnl, though diastolic consistently low.  - Continued home Metoprolol.  Home Telmisartan held due to AKI.  Hx of T2DM Hypoglycemic episode Diet controlled. Last A1c 6.5% on 05/10/20.  Pt was found to be hypoglycemic at times due to poor PO intake.  -received d5--sugars ok at present -start po boost breeze  Pyuria UA on presentation has mod leuk, >50 WBC, but no bacteria.  Pt had no urinary symptoms.  Abx was not started.    Procedures:none Family communication :son and dter Consults :oncology,  nephrology CODE STATUS: DNR (discussed by Dr Janese Banks) DVT Prophylaxis :Heparin  Status is: Inpatient  Remains inpatient appropriate because:Persistent severe electrolyte disturbances   Dispo: The patient is from: Home              Anticipated d/c is to: awaiting [facility]              Anticipated d/c date is: > 3 days              Patient currently is not medically stable to  d/c.     TOTAL TIME TAKING CARE OF THIS PATIENT: *25* minutes.  >50% time spent on counselling and coordination of care  Note: This dictation was prepared with Dragon dictation along with smaller phrase technology. Any transcriptional errors that result from this process are unintentional.  Fritzi Mandes M.D    [facility]   CC: Primary care physician; Crecencio Mc, MDPatient ID: Haley Love, female   DOB: August 15, 1934, 86 y.o.   MRN: [id number]

## 2020-06-03 NOTE — Progress Notes (Signed)
[facility]  ROUNDING NOTE   Subjective:  Renal function improved.  Cr currently 2.15.  K slightly higher at 5.4.    Objective:  Vital signs in last 24 hours:  Temp:  [97.5 F (36.4 C)-98.2 F (36.8 C)] 97.5 F (36.4 C) (07/11 1318) Pulse Rate:  [78-98] 78 (07/11 1318) Resp:  [18-20] 18 (07/11 0346) BP: (123-156)/(47-57) 123/57 (07/11 1318) SpO2:  [93 %-100 %] 93 % (07/11 1318)  Weight change:  Filed Weights   05/31/20 1735  Weight: 77 kg    Intake/Output: I/O last 3 completed shifts: In: 720 [P.O.:720] Out: 2200 [Urine:2200]   Intake/Output this shift:  Total I/O In: 3630.4 [I.V.:3586; IV Piggyback:44.4] Out: -   Physical Exam: General: No acute distress  Head: Normocephalic, atraumatic. Moist oral mucosal membranes  Eyes: Anicteric  Neck: Supple, trachea midline  Lungs:  Clear to auscultation, normal effort  Heart: S1S2 no rubs  Abdomen:  Soft, nontender, mild distention  Extremities: No peripheral edema.  Neurologic: Awake, alert, following commands  Skin: No lesions       Basic Metabolic Panel: Recent Labs  Lab 05/31/20 1245 05/31/20 1804 06/01/20 0623 06/01/20 0623 06/01/20 1141 06/01/20 1733 06/01/20 1733 06/02/20 0548 06/02/20 1710 06/03/20 0736 06/03/20 1144  NA 128*   < > 130*  --   --  127*  --  130* 126*  --  127*  K 5.2*   < > 5.4*  --   --  5.5*  --  5.2* 5.3*  --  5.4*  CL 93*   < > 99  --   --  99  --  102 98  --  103  CO2 27   < > 21*  --   --  23  --  21* 18*  --  19*  GLUCOSE 102*   < > 72  --   --  78  --  66* 84  --  141*  BUN 55*   < > 64*  --   --  60*  --  62* 59*  --  59*  CREATININE 3.38*   < > 3.53*  --   --  2.85*  --  2.39* 2.35*  --  2.15*  CALCIUM 12.2*   < > 10.9*   < >  --  10.9*   < > 11.4* 11.6*  --  12.4*  MG 1.7  --   --   --   --   --   --  1.5*  --  2.0  --   PHOS  --   --   --   --  3.5  --   --   --   --   --   --    < > = values in this interval not displayed.    Liver Function  Tests: Recent Labs  Lab 05/31/20 1245 05/31/20 1810  AST 35 43*  ALT 24 28  ALKPHOS 79 75  BILITOT 0.8 1.1  PROT 6.0 6.3*  ALBUMIN 3.6 3.2*   Recent Labs  Lab 05/31/20 1804  LIPASE 40   No results for input(s): AMMONIA in the last 168 hours.  CBC: Recent Labs  Lab 05/31/20 1245 05/31/20 1804 06/01/20 0623 06/02/20 0548 06/03/20 0736  WBC 75.5 Repeated and verified X2.* 86.9*  88.0* 91.7* 89.9* 85.4*  NEUTROABS 11.8* 11.3*  --   --   --   HGB 11.9* 12.3  12.2 10.2* 11.0* 10.8*  HCT 37.4 35.1*  35.2* 29.7* 32.2* 31.9*  MCV 94.6 89.3  88.0 89.2 91.0 89.9  PLT 257.0 271  269 216 219 219    Cardiac Enzymes: No results for input(s): CKTOTAL, CKMB, CKMBINDEX, TROPONINI in the last 168 hours.  BNP: Invalid input(s): POCBNP  CBG: Recent Labs  Lab 06/03/20 0020 06/03/20 0350 06/03/20 0750 06/03/20 1316 06/03/20 1603  GLUCAP 111* 119* 137* 122* 117*    Microbiology: Results for orders placed or performed during the hospital encounter of 05/31/20  SARS Coronavirus 2 by RT PCR (hospital order, performed in [facility] hospital lab) Nasopharyngeal Nasopharyngeal Swab     Status: None   Collection Time: 05/31/20  8:42 PM   Specimen: Nasopharyngeal Swab  Result Value Ref Range Status   SARS Coronavirus 2 NEGATIVE NEGATIVE Final    Comment: (NOTE) SARS-CoV-2 target nucleic acids are NOT DETECTED.  The SARS-CoV-2 RNA is generally detectable in upper and lower respiratory specimens during the acute phase of infection. The lowest concentration of SARS-CoV-2 viral copies this assay can detect is 250 copies / mL. A negative result does not preclude SARS-CoV-2 infection and should not be used as the sole basis for treatment or other patient management decisions.  A negative result may occur with improper specimen collection / handling, submission of specimen other than nasopharyngeal swab, presence of viral mutation(s) within the areas targeted by this assay, and  inadequate number of viral copies (<250 copies / mL). A negative result must be combined with clinical observations, patient history, and epidemiological information.  Fact Sheet for Patients:   StrictlyIdeas.no  Fact Sheet for Healthcare Providers: BankingDealers.co.za  This test is not yet approved or  cleared by the [place] FDA and has been authorized for detection and/or diagnosis of SARS-CoV-2 by FDA under an Emergency Use Authorization (EUA).  This EUA will remain in effect (meaning this test can be used) for the duration of the COVID-19 declaration under Section 564(b)(1) of the Act, 21 U.S.C. section 360bbb-3(b)(1), unless the authorization is terminated or revoked sooner.  Performed at [facility], [address] [place], [place] [address]   Urine Culture     Status: Abnormal   Collection Time: 06/01/20  1:26 AM   Specimen: Urine, Random  Result Value Ref Range Status   Specimen Description   Final    URINE, RANDOM Performed at [facility], [address] [place], [place] [address]    Special Requests   Final    NONE Performed at [facility], [address] [place], [place] [address]    Culture (A)  Final    80,000 COLONIES/mL GROUP B STREP(S.AGALACTIAE)ISOLATED TESTING AGAINST S. AGALACTIAE NOT ROUTINELY PERFORMED DUE TO PREDICTABILITY OF AMP/PEN/VAN SUSCEPTIBILITY. Performed at [facility], [place] [address], [place], [place] [address]    Report Status 06/02/2020 FINAL  Final    Coagulation Studies: No results for input(s): LABPROT, INR in the last 72 hours.  Urinalysis: Recent Labs    06/01/20 0126  COLORURINE YELLOW*  LABSPEC 1.013  PHURINE 5.0  GLUCOSEU NEGATIVE  HGBUR NEGATIVE  BILIRUBINUR NEGATIVE  KETONESUR NEGATIVE  PROTEINUR NEGATIVE  NITRITE NEGATIVE  LEUKOCYTESUR MODERATE*      Imaging: DG Abd 2 Views  Result Date: 06/02/2020 CLINICAL DATA:   Abdominal distension, weakness, CLL EXAM: ABDOMEN - 2 VIEW COMPARISON:  05/31/2020 FINDINGS: Supine and upright frontal views of the abdomen and pelvis demonstrate unremarkable bowel gas pattern. No masses or abnormal calcifications. No free gas within the greater peritoneal  sac. Stable atherosclerosis. IMPRESSION: 1. Unremarkable bowel gas pattern. Electronically Signed   By: Randa Ngo M.D.   On: 06/02/2020 19:22     Medications:   . sodium chloride Stopped (06/03/20 1539)  . pamidronate 130 mL/hr at 06/03/20 1600   . allopurinol  100 mg Oral Daily  . aspirin EC  81 mg Oral Daily  . feeding supplement  1 Container Oral TID BM  . heparin  5,000 Units Subcutaneous Q8H  . metoprolol tartrate  25 mg Oral BID  . pantoprazole  40 mg Oral Daily  . patiromer  8.4 g Oral Daily   acetaminophen **OR** acetaminophen, alum & mag hydroxide-simeth, docusate sodium, guaiFENesin-dextromethorphan, ondansetron (ZOFRAN) IV, ondansetron, polyethylene glycol, prochlorperazine  Assessment/ Plan:  86 y.o. female with past medical history of aggressive chronic lymphocytic leukemia, diabetes mellitus type 2, hypertension, hyperlipidemia admitted with acute kidney injury, concerns for tumor lysis syndrome.  1.  Acute kidney injury likely multifactorial with contributions from dehydration, possible tumor lysis syndrome, volume shifts.   -  Renal function improved, Cr down to 2.15, continue hydration.   2.  Hyperkalemia.  In part due to elevated WBC count, on low dose veltassa 8.4g po daily.   3.  Hyperuricemia.  Likely related to tumor lysis.  Agree with allopurinol 100 mg daily.  Case discussed with oncology, hold offf on rasburicase.  Pt being transferred to [facility].    LOS: 3 Haley Love 7/11/20214:59 PM

## 2020-06-03 NOTE — Progress Notes (Signed)
PT Cancellation Note  Patient Details Name: Haley Love MRN: [id number] DOB: 03/10/1934   Cancelled Treatment:    Reason Eval/Treat Not Completed:  (Per chart review, patient with plans for transfer to outside facility for additional medical evaluation/treatment this date.  Will hold PT evaluation and re-attempt later time/date if patient remains hospitalized at this facility.)   Dorothymae Maciver H. Owens Shark, PT, DPT, NCS 06/03/20, 9:04 AM [phone]

## 2020-06-04 ENCOUNTER — Other Ambulatory Visit: Payer: Self-pay | Admitting: Oncology

## 2020-06-04 ENCOUNTER — Telehealth: Payer: Self-pay | Admitting: Internal Medicine

## 2020-06-04 DIAGNOSIS — N184 Chronic kidney disease, stage 4 (severe): Secondary | ICD-10-CM

## 2020-06-04 DIAGNOSIS — Z515 Encounter for palliative care: Secondary | ICD-10-CM

## 2020-06-04 LAB — GLUCOSE, CAPILLARY
Glucose-Capillary: 86 mg/dL (ref 70–99)
Glucose-Capillary: 88 mg/dL (ref 70–99)
Glucose-Capillary: 89 mg/dL (ref 70–99)
Glucose-Capillary: 92 mg/dL (ref 70–99)

## 2020-06-04 LAB — KAPPA/LAMBDA LIGHT CHAINS
Kappa free light chain: 27.8 mg/L — ABNORMAL HIGH (ref 3.3–19.4)
Kappa, lambda light chain ratio: 1.09 (ref 0.26–1.65)
Lambda free light chains: 25.4 mg/L (ref 5.7–26.3)

## 2020-06-04 LAB — LACTATE DEHYDROGENASE: LDH: 1295 U/L — ABNORMAL HIGH (ref 98–192)

## 2020-06-04 LAB — URIC ACID: Uric Acid, Serum: 9.3 mg/dL — ABNORMAL HIGH (ref 2.5–7.1)

## 2020-06-04 LAB — MAGNESIUM: Magnesium: 2 mg/dL (ref 1.7–2.4)

## 2020-06-04 MED ORDER — MORPHINE SULFATE (PF) 2 MG/ML IV SOLN
1.0000 mg | INTRAVENOUS | Status: AC
  Administered 2020-06-04: 1 mg via INTRAVENOUS
  Filled 2020-06-04: qty 1

## 2020-06-04 MED ORDER — MORPHINE SULFATE (PF) 2 MG/ML IV SOLN: 1.0000 mg | INTRAVENOUS | Status: DC | PRN

## 2020-06-04 MED ORDER — MORPHINE SULFATE (PF) 2 MG/ML IV SOLN
1.0000 mg | INTRAVENOUS | Status: DC | PRN
  Administered 2020-06-04 – 2020-06-05 (×6): 1 mg via INTRAVENOUS
  Filled 2020-06-04 (×6): qty 1

## 2020-06-04 MED ORDER — LORAZEPAM 2 MG/ML IJ SOLN
0.5000 mg | INTRAMUSCULAR | Status: DC | PRN
  Administered 2020-06-05: 0.5 mg via INTRAVENOUS
  Filled 2020-06-04: qty 1

## 2020-06-04 MED ORDER — SCOPOLAMINE 1 MG/3DAYS TD PT72
1.0000 | MEDICATED_PATCH | TRANSDERMAL | Status: DC
  Administered 2020-06-04: 1.5 mg via TRANSDERMAL
  Filled 2020-06-04: qty 1

## 2020-06-04 MED ORDER — MORPHINE SULFATE (PF) 2 MG/ML IV SOLN: 2.0000 mg | INTRAVENOUS | Status: DC | PRN

## 2020-06-04 NOTE — Consult Note (Signed)
[place]  [phone] [phone]   Name: Haley Love Date: 06/04/2020 MRN: [id number]  DOB: 11-Apr-1934  Patient Care Team: Crecencio Mc, MD as PCP - General (Internal Medicine) Magrinat, Virgie Dad, MD as Consulting Physician (Oncology) Marry Guan, Laurice Record, MD (Orthopedic Surgery)    REASON FOR CONSULTATION: Haley Love is a 86 y.o. female with multiple medical problems including T-cell prolymphocytic leukemia admitted to [facility] on 05/31/2020 for acute kidney injury and worsening leukocytosis.  Plan was for transfer to [facility] clinically decompensated and was made comfort care by family.    SOCIAL HISTORY:     reports that she has never smoked. She has never used smokeless tobacco. She reports current alcohol use. She reports that she does not use drugs.   Patient is widowed.  She lives at home alone.  She has a son and daughter who are involved in her care.  ADVANCE DIRECTIVES:  None on file  CODE STATUS: DNR  PAST MEDICAL HISTORY: Past Medical History:  Diagnosis Date  . Breast cancer ([place])   . Cataract   . Chickenpox   . Contact lens/glasses fitting   . Diabetes mellitus without complication ([place])   . Hyperlipidemia   . Hypertension   . Inflammatory polyps of colon ([place])     PAST SURGICAL HISTORY:  Past Surgical History:  Procedure Laterality Date  . BREAST LUMPECTOMY Left 07/28/2012  . BREAST SURGERY  9/13   lt lump  . cataracts  2014  . COLONOSCOPY WITH PROPOFOL N/A 01/05/2017   Procedure: COLONOSCOPY WITH PROPOFOL;  Surgeon: Manya Silvas, MD;  Location: [facility] ENDOSCOPY;  Service: Endoscopy;  Laterality: N/A;  . JOINT REPLACEMENT Right July 2015   [place], right knee  . KNEE ARTHROSCOPY W/ MENISCAL REPAIR  09/23/11   Right  . MASTECTOMY, PARTIAL  08/06/2012   Procedure: MASTECTOMY PARTIAL;  Surgeon: Adin Hector, MD;  Location: [place];  Service: General;   Laterality: Left;  Left partial mastectomy with re-excision of margins    HEMATOLOGY/ONCOLOGY HISTORY:  Oncology History   No history exists.    ALLERGIES:  is allergic to no known allergies.  MEDICATIONS:  Current Facility-Administered Medications  Medication Dose Route Frequency Provider Last Rate Last Admin  . acetaminophen (TYLENOL) tablet 650 mg  650 mg Oral Q6H PRN Nicolette Bang, DO   650 mg at 06/04/20 8416   Or  . acetaminophen (TYLENOL) suppository 650 mg  650 mg Rectal Q6H PRN Nicolette Bang, DO      . guaiFENesin-dextromethorphan (ROBITUSSIN DM) 100-10 MG/5ML syrup 10 mL  10 mL Oral Q6H PRN Enzo Bi, MD      . LORazepam (ATIVAN) injection 0.5 mg  0.5 mg Intravenous Q4H PRN Fritzi Mandes, MD      . morphine 2 MG/ML injection 1 mg  1 mg Intravenous Q1H PRN , Kirt Boys, NP      . prochlorperazine (COMPAZINE) injection 10 mg  10 mg Intravenous Q6H PRN Sindy Guadeloupe, MD   10 mg at 06/02/20 1717  . scopolamine (TRANSDERM-SCOP) 1 MG/3DAYS 1.5 mg  1 patch Transdermal Q72H Fritzi Mandes, MD   1.5 mg at 06/04/20 1344    VITAL SIGNS: BP (!) 139/43   Pulse 89   Temp 97.9 F (36.6 C) (Axillary)   Resp 20   Ht 5' 5" (1.651 m)   Wt 169 lb 12.1 oz (77 kg)   SpO2 94%  BMI 28.25 kg/m  Filed Weights   05/31/20 1735  Weight: 169 lb 12.1 oz (77 kg)    Estimated body mass index is 28.25 kg/m as calculated from the following:   Height as of this encounter: 5' 5" (1.651 m).   Weight as of this encounter: 169 lb 12.1 oz (77 kg).  LABS: CBC:    Component Value Date/Time   WBC 85.4 (HH) 06/03/2020 0736   HGB 10.8 (L) 06/03/2020 0736   HGB 13.7 05/20/2019 1223   HGB 14.7 08/06/2017 1119   HCT 31.9 (L) 06/03/2020 0736   HCT 44.1 08/06/2017 1119   PLT 219 06/03/2020 0736   PLT 221 05/20/2019 1223   PLT 238 08/06/2017 1119   MCV 89.9 06/03/2020 0736   MCV 90.9 08/06/2017 1119   NEUTROABS 11.3 (H) 05/31/2020 1804   NEUTROABS 4.3 08/06/2017 1119    LYMPHSABS 28.7 (H) 05/31/2020 1804   LYMPHSABS 8.3 (H) 08/06/2017 1119   MONOABS 8.7 (H) 05/31/2020 1804   MONOABS 0.4 08/06/2017 1119   EOSABS 0.0 05/31/2020 1804   EOSABS 0.2 08/06/2017 1119   BASOSABS 0.0 05/31/2020 1804   BASOSABS 0.1 08/06/2017 1119   Comprehensive Metabolic Panel:    Component Value Date/Time   NA 127 (L) 06/03/2020 1144   NA 140 08/06/2017 1119   K 5.4 (H) 06/03/2020 1144   K 4.9 08/06/2017 1119   CL 103 06/03/2020 1144   CL 103 06/16/2014 0617   CL 105 09/08/2012 1002   CO2 19 (L) 06/03/2020 1144   CO2 27 08/06/2017 1119   BUN 59 (H) 06/03/2020 1144   BUN 37.3 (H) 08/06/2017 1119   CREATININE 2.15 (H) 06/03/2020 1144   CREATININE 1.03 (H) 05/20/2019 1223   CREATININE 1.4 (H) 08/06/2017 1119   GLUCOSE 141 (H) 06/03/2020 1144   GLUCOSE 115 08/06/2017 1119   GLUCOSE 115 (H) 09/08/2012 1002   CALCIUM 12.4 (H) 06/03/2020 1144   CALCIUM 10.4 08/06/2017 1119   AST 43 (H) 05/31/2020 1810   AST 27 05/20/2019 1223   AST 47 (H) 08/06/2017 1119   ALT 28 05/31/2020 1810   ALT 27 05/20/2019 1223   ALT 43 08/06/2017 1119   ALKPHOS 75 05/31/2020 1810   ALKPHOS 59 08/06/2017 1119   BILITOT 1.1 05/31/2020 1810   BILITOT 0.4 05/20/2019 1223   BILITOT 0.48 08/06/2017 1119   PROT 6.3 (L) 05/31/2020 1810   PROT 7.0 08/06/2017 1159   PROT 7.6 08/06/2017 1119   ALBUMIN 3.2 (L) 05/31/2020 1810   ALBUMIN 4.2 08/06/2017 1119    RADIOGRAPHIC STUDIES: DG Chest 2 View  Result Date: 05/31/2020 CLINICAL DATA:  Abnormal CT EXAM: CHEST - 2 VIEW COMPARISON:  CT 05/31/2020, chest x-ray 05/23/2020 FINDINGS: Tiny pleural effusions on lateral view. No consolidation or effusion. Normal heart size. No pneumothorax. Aortic atherosclerosis. IMPRESSION: Tiny pleural effusion on lateral view. Electronically Signed   By: Donavan Foil M.D.   On: 05/31/2020 21:56   DG Chest 2 View  Result Date: 05/23/2020 CLINICAL DATA:  Chest burning.  Chest pain. EXAM: CHEST - 2 VIEW COMPARISON:   Radiograph 06/23/2013 FINDINGS: The cardiomediastinal contours are normal. The lungs are clear. Aortic atherosclerosis. Pulmonary vasculature is normal. No consolidation, pleural effusion, or pneumothorax. No acute osseous abnormalities are seen. Degenerative change in the thoracic spine. Surgical clips project over the left breast/chest wall. IMPRESSION: No acute chest findings. Electronically Signed   By: Keith Rake M.D.   On: 05/23/2020 04:16   DG Abd 1 View  Result Date: 05/31/2020 CLINICAL DATA:  Lower abdominal pain. EXAM: ABDOMEN - 1 VIEW COMPARISON:  Subsequent abdominal CT, available at time of radiograph interpretation. FINDINGS: No bowel dilatation to suggest obstruction. Small volume of colonic stool. No evidence of free air. No radiopaque calculi. Right pelvic phlebolith. Aortic atherosclerosis. Scoliosis and degenerative change in the spine. IMPRESSION: Normal bowel gas pattern. Electronically Signed   By: Keith Rake M.D.   On: 05/31/2020 19:49   DG Abd 2 Views  Result Date: 06/02/2020 CLINICAL DATA:  Abdominal distension, weakness, CLL EXAM: ABDOMEN - 2 VIEW COMPARISON:  05/31/2020 FINDINGS: Supine and upright frontal views of the abdomen and pelvis demonstrate unremarkable bowel gas pattern. No masses or abnormal calcifications. No free gas within the greater peritoneal sac. Stable atherosclerosis. IMPRESSION: 1. Unremarkable bowel gas pattern. Electronically Signed   By: Randa Ngo M.D.   On: 06/02/2020 19:22   CT Renal Stone Study  Result Date: 05/31/2020 CLINICAL DATA:  Flank pain, kidney stone suspected. Worsening kidney function. Weakness for 2 weeks. White blood cell count 75. EXAM: CT ABDOMEN AND PELVIS WITHOUT CONTRAST TECHNIQUE: Multidetector CT imaging of the abdomen and pelvis was performed following the standard protocol without IV contrast. COMPARISON:  Radiograph earlier today. FINDINGS: Lower chest: Anterior lingular opacity abutting the left heart border is  only partially included in the field of view. There is additional streaky atelectasis in the lower lobes. Trace left pleural effusion. Small to moderate hiatal hernia. Coronary artery calcifications. There are small epicardial nodes. Hepatobiliary: No evidence of focal hepatic lesion on noncontrast exam. No calcified gallstone or pericholecystic inflammation. No biliary dilatation. Pancreas: No ductal dilatation or inflammation. Fullness in the region the pancreatic head which is adjacent to retroperitoneal fullness/adenopathy. This is not well assessed on noncontrast exam. Spleen: Normal in size. Greatest splenic dimension 12.6 cm. No focal abnormality on noncontrast exam. Adrenals/Urinary Tract: Bilateral adrenal thickening. No hydronephrosis. No renal or ureteral calculi. Urinary bladder is completely empty and not well assessed. Stomach/Bowel: Bowel evaluation is limited in the absence of contrast and presence of intra-abdominal ascites. Small to moderate hiatal hernia. Stomach is decompressed. There is no evidence of small-bowel obstruction. Equivocal wall thickening of the distal and terminal ileum versus nondistention. Normal appendix. Majority of the colon is nondistended and not well assessed. There is no colonic inflammation. Small volume of stool in the sigmoid colon and rectum. Vascular/Lymphatic: Aortic atherosclerosis. No aortic aneurysm. Multifocal adenopathy throughout the abdomen and pelvis. Majority of the lymph nodes are 10-15 mm. There are enlarged retrocrural, retroperitoneal, mesenteric and omental nodes. Retroperitoneal adenopathy is difficult to delineate from the adjacent vascular structures. Representative left periaortic node at the level of the left kidney measures 14 mm, series 2, image 36. Representative ileocolic node measures 10 mm., series 2, image 55. Central mesenteric node measures 15 mm, series 2, image 48. There is no inguinal adenopathy. There is generalized mesenteric and  omental edema and fat stranding. Reproductive: Uterus is grossly normal. Limited adnexal assessment. Other: Generalized mesenteric and omental edema and fat stranding. There is a small volume of abdominopelvic ascites. No free air. Mild subcutaneous edema of the anterior abdominal wall. Musculoskeletal: Multilevel degenerative change throughout the spine. Near complete disc space loss at L1-L2. No focal osseous abnormality. IMPRESSION: 1. No renal stones or obstructive uropathy. 2. Multifocal adenopathy throughout the abdomen and pelvis, including retrocrural, retroperitoneal, mesenteric, and omental enlarged nodes. Majority of these nodes measure 10-15 mm. In the setting of markedly elevated white blood cell count findings  are suspicious for lymphoproliferative disorder. Metastatic disease is also considered. 3. Equivocal wall thickening of the distal and terminal ileum versus nondistention. 4. Generalized edema of the mesentery in the anterior omentum. Small volume abdominopelvic ascites. 5. Anterior lingular opacity abutting the left heart border is only partially included in the field of view. This is nonspecific in the setting. Consider completion imaging with chest CT. 6. Small to moderate hiatal hernia. Aortic Atherosclerosis (ICD10-I70.0). Electronically Signed   By: Keith Rake M.D.   On: 05/31/2020 19:48    PERFORMANCE STATUS (ECOG) : 4 - Bedbound  Review of Systems Unable to complete  Physical Exam General: Ill-appearing Pulmonary: Unlabored Extremities: no edema, no joint deformities Skin: no rashes Neurological: Poorly responsive  IMPRESSION: Patient clinically decompensated and was transitioned to comfort care today by family.  Patient received a dose of morphine earlier this afternoon.  Patient is currently poorly responsive but comfortable appearing.  Breathing does not appear labored at present.  I met with patient's son and daughter at bedside.  Both verbalized understanding  that patient is felt to be at end-of-life and state they only want to focus on keeping her comfortable.  Given patient's rapid decline, I would anticipate an in-hospital death.  All family questions were answered.  Emotional support was provided.  Daughter requests a chaplain consult for prayer.  PLAN: -Comfort care -Liberalize comfort meds -Anticipate in-hospital death -Chaplain consult   Time Total: 30 minutes  Visit consisted of counseling and education dealing with the complex and emotionally intense issues of symptom management and palliative care in the setting of serious and potentially life-threatening illness.Greater than 50%  of this time was spent counseling and coordinating care related to the above assessment and plan.  Signed by: Altha Harm, PhD, NP-C

## 2020-06-04 NOTE — Progress Notes (Signed)
   06/04/20 1600  Clinical Encounter Type  Visited With Family  Visit Type Initial  Referral From Nurse  Spiritual Encounters  Spiritual Needs Prayer  Chaplain received OR for prayer (end of life). Chaplain made pastoral presence known by introducing herself to son and offered prayer and comfort. Son was Patent attorney.

## 2020-06-04 NOTE — Progress Notes (Signed)
PT Cancellation Note  Patient Details Name: Haley Love MRN: [id number] DOB: February 06, 1934   Cancelled Treatment:    Reason Eval/Treat Not Completed: Other (comment).  Per MD Posey Pronto in pt rounds today, physical therapy to sign off at this time d/t current medical status.  Please re-consult PT at a later date/time as medically appropriate.  Leitha Bleak, PT 06/04/20, 12:28 PM

## 2020-06-04 NOTE — Progress Notes (Signed)
Hematology/Oncology Consult note [facility]  [phone] [phone]  Patient Care Team: Crecencio Mc, MD as PCP - General (Internal Medicine) Magrinat, Virgie Dad, MD as Consulting Physician (Oncology) Marry Guan Laurice Record, MD (Orthopedic Surgery)   Name of the patient: Haley Love  [id number]  [birth year]   Date of visit: 06/04/2020  Interval history- patient has declined in the last 24 hours. Her eyes are open but she does not verbalize to me today. Breathing appears labored   Review of systems- Review of Systems  Unable to perform ROS: Medical condition       Allergies  Allergen Reactions   No Known Allergies      Past Medical History:  Diagnosis Date   Breast cancer ([place])    Cataract    Chickenpox    Contact lens/glasses fitting    Diabetes mellitus without complication ([place])    Hyperlipidemia    Hypertension    Inflammatory polyps of colon [facility])      Past Surgical History:  Procedure Laterality Date   BREAST LUMPECTOMY Left 07/28/2012   BREAST SURGERY  9/13   lt lump   cataracts  2014   COLONOSCOPY WITH PROPOFOL N/A 01/05/2017   Procedure: COLONOSCOPY WITH PROPOFOL;  Surgeon: Manya Silvas, MD;  Location: [facility];  Service: Endoscopy;  Laterality: N/A;   JOINT REPLACEMENT Right July 2015   [place], right knee   KNEE ARTHROSCOPY W/ MENISCAL REPAIR  09/23/11   Right   MASTECTOMY, PARTIAL  08/06/2012   Procedure: MASTECTOMY PARTIAL;  Surgeon: Adin Hector, MD;  Location: [place];  Service: General;  Laterality: Left;  Left partial mastectomy with re-excision of margins    Social History   Socioeconomic History   Marital status: Divorced    Spouse name: Not on file   Number of children: Not on file   Years of education: Not on file   Highest education level: Not on file  Occupational History   Not on file  Tobacco Use   Smoking status: Never Smoker    Smokeless tobacco: Never Used  Vaping Use   Vaping Use: Never used  Substance and Sexual Activity   Alcohol use: Yes    Comment: 1 glass wine week   Drug use: No   Sexual activity: Never    Birth control/protection: Post-menopausal  Other Topics Concern   Not on file  Social History Narrative   Lives alone ,     Social Determinants of Health   Financial Resource Strain:    Difficulty of Paying Living Expenses:   Food Insecurity:    Worried About Charity fundraiser in the Last Year:    Arboriculturist in the Last Year:   Transportation Needs:    Film/video editor (Medical):    Lack of Transportation (Non-Medical):   Physical Activity:    Days of Exercise per Week:    Minutes of Exercise per Session:   Stress:    Feeling of Stress :   Social Connections:    Frequency of Communication with Friends and Family:    Frequency of Social Gatherings with Friends and Family:    Attends Religious Services:    Active Member of Clubs or Organizations:    Attends Archivist Meetings:    Marital Status:   Intimate Partner Violence:    Fear of Current or Ex-Partner:    Emotionally Abused:    Physically Abused:  Sexually Abused:     Family History  Problem Relation Age of Onset   Lung cancer Mother    Arthritis Mother    Hyperlipidemia Mother    Hypertension Mother    Throat cancer Father    Hyperlipidemia Father    Cancer Paternal Aunt        breast   Dementia Brother    Leukemia Brother        lymphatic /CLL     Current Facility-Administered Medications:    0.9 %  sodium chloride infusion, , Intravenous, Continuous, Enzo Bi, MD, Last Rate: 125 mL/hr at 06/04/20 1100, Rate Verify at 06/04/20 1100   acetaminophen (TYLENOL) tablet 650 mg, 650 mg, Oral, Q6H PRN, 650 mg at 06/04/20 0603 **OR** acetaminophen (TYLENOL) suppository 650 mg, 650 mg, Rectal, Q6H PRN, Nicolette Bang, DO   allopurinol (ZYLOPRIM) tablet  100 mg, 100 mg, Oral, Daily, Enzo Bi, MD, 100 mg at 06/03/20 0804   alum & mag hydroxide-simeth (MAALOX/MYLANTA) 200-200-20 MG/5ML suspension 15 mL, 15 mL, Oral, Q6H PRN, Enzo Bi, MD, 15 mL at 06/02/20 0946   aspirin EC tablet 81 mg, 81 mg, Oral, Daily, Enzo Bi, MD, 81 mg at 06/03/20 0804   docusate sodium (COLACE) capsule 100 mg, 100 mg, Oral, BID PRN, Enzo Bi, MD, 100 mg at 06/02/20 1724   feeding supplement (BOOST / RESOURCE BREEZE) liquid 1 Container, 1 Container, Oral, TID BM, Fritzi Mandes, MD, 1 Container at 06/03/20 2132   guaiFENesin-dextromethorphan (ROBITUSSIN DM) 100-10 MG/5ML syrup 10 mL, 10 mL, Oral, Q6H PRN, Enzo Bi, MD   heparin injection 5,000 Units, 5,000 Units, Subcutaneous, Q8H, Nicolette Bang, DO, 5,000 Units at 06/04/20 0603   metoprolol tartrate (LOPRESSOR) tablet 25 mg, 25 mg, Oral, BID, Nicolette Bang, DO, 25 mg at 06/04/20 1029   ondansetron (ZOFRAN) injection 4 mg, 4 mg, Intravenous, Q6H PRN, Enzo Bi, MD   ondansetron (ZOFRAN-ODT) disintegrating tablet 4 mg, 4 mg, Oral, Q8H PRN, Enzo Bi, MD, 4 mg at 06/02/20 0946   pantoprazole (PROTONIX) EC tablet 40 mg, 40 mg, Oral, Daily, Nicolette Bang, DO, 40 mg at 06/03/20 0347   patiromer (VELTASSA) packet 8.4 g, 8.4 g, Oral, Daily, Lateef, Munsoor, MD, 8.4 g at 06/04/20 1029   polyethylene glycol (MIRALAX / GLYCOLAX) packet 17 g, 17 g, Oral, BID PRN, Enzo Bi, MD   prochlorperazine (COMPAZINE) injection 10 mg, 10 mg, Intravenous, Q6H PRN, Sindy Guadeloupe, MD, 10 mg at 06/02/20 1717  Physical exam:  Vitals:   06/03/20 1318 06/03/20 2000 06/04/20 0426 06/04/20 1025  BP: (!) 123/57 (!) 158/42 (!) 148/45 (!) 139/43  Pulse: 78 88 89 89  Resp:  18 20   Temp: (!) 97.5 F (36.4 C) 98 F (36.7 C) 97.9 F (36.6 C)   TempSrc:  Oral Axillary   SpO2: 93% 95% 94%   Weight:      Height:       Physical Exam Constitutional:      Comments: eyes open but does not verbalize    Cardiovascular:     Heart sounds: Normal heart sounds.  Pulmonary:     Comments: Effort increased Abdominal:     Comments: Soft, distended  Skin:    General: Skin is warm and dry.      CMP Latest Ref Rng & Units 06/03/2020  Glucose 70 - 99 mg/dL 141(H)  BUN 8 - 23 mg/dL 59(H)  Creatinine 0.44 - 1.00 mg/dL 2.15(H)  Sodium 135 - 145 mmol/L 127(L)  Potassium 3.5 - 5.1 mmol/L 5.4(H)  Chloride 98 - 111 mmol/L 103  CO2 22 - 32 mmol/L 19(L)  Calcium 8.9 - 10.3 mg/dL 12.4(H)  Total Protein 6.5 - 8.1 g/dL -  Total Bilirubin 0.3 - 1.2 mg/dL -  Alkaline Phos 38 - 126 U/L -  AST 15 - 41 U/L -  ALT 0 - 44 U/L -   CBC Latest Ref Rng & Units 06/03/2020  WBC 4.0 - 10.5 K/uL 85.4(HH)  Hemoglobin 12.0 - 15.0 g/dL 10.8(L)  Hematocrit 36 - 46 % 31.9(L)  Platelets 150 - 400 K/uL 219    @IMAGES @  DG Chest 2 View  Result Date: 05/31/2020 CLINICAL DATA:  Abnormal CT EXAM: CHEST - 2 VIEW COMPARISON:  CT 05/31/2020, chest x-ray 05/23/2020 FINDINGS: Tiny pleural effusions on lateral view. No consolidation or effusion. Normal heart size. No pneumothorax. Aortic atherosclerosis. IMPRESSION: Tiny pleural effusion on lateral view. Electronically Signed   By: Donavan Foil M.D.   On: 05/31/2020 21:56   DG Chest 2 View  Result Date: 05/23/2020 CLINICAL DATA:  Chest burning.  Chest pain. EXAM: CHEST - 2 VIEW COMPARISON:  Radiograph 06/23/2013 FINDINGS: The cardiomediastinal contours are normal. The lungs are clear. Aortic atherosclerosis. Pulmonary vasculature is normal. No consolidation, pleural effusion, or pneumothorax. No acute osseous abnormalities are seen. Degenerative change in the thoracic spine. Surgical clips project over the left breast/chest wall. IMPRESSION: No acute chest findings. Electronically Signed   By: Keith Rake M.D.   On: 05/23/2020 04:16   DG Abd 1 View  Result Date: 05/31/2020 CLINICAL DATA:  Lower abdominal pain. EXAM: ABDOMEN - 1 VIEW COMPARISON:  Subsequent abdominal CT,  available at time of radiograph interpretation. FINDINGS: No bowel dilatation to suggest obstruction. Small volume of colonic stool. No evidence of free air. No radiopaque calculi. Right pelvic phlebolith. Aortic atherosclerosis. Scoliosis and degenerative change in the spine. IMPRESSION: Normal bowel gas pattern. Electronically Signed   By: Keith Rake M.D.   On: 05/31/2020 19:49   DG Abd 2 Views  Result Date: 06/02/2020 CLINICAL DATA:  Abdominal distension, weakness, CLL EXAM: ABDOMEN - 2 VIEW COMPARISON:  05/31/2020 FINDINGS: Supine and upright frontal views of the abdomen and pelvis demonstrate unremarkable bowel gas pattern. No masses or abnormal calcifications. No free gas within the greater peritoneal sac. Stable atherosclerosis. IMPRESSION: 1. Unremarkable bowel gas pattern. Electronically Signed   By: Randa Ngo M.D.   On: 06/02/2020 19:22   CT Renal Stone Study  Result Date: 05/31/2020 CLINICAL DATA:  Flank pain, kidney stone suspected. Worsening kidney function. Weakness for 2 weeks. White blood cell count 75. EXAM: CT ABDOMEN AND PELVIS WITHOUT CONTRAST TECHNIQUE: Multidetector CT imaging of the abdomen and pelvis was performed following the standard protocol without IV contrast. COMPARISON:  Radiograph earlier today. FINDINGS: Lower chest: Anterior lingular opacity abutting the left heart border is only partially included in the field of view. There is additional streaky atelectasis in the lower lobes. Trace left pleural effusion. Small to moderate hiatal hernia. Coronary artery calcifications. There are small epicardial nodes. Hepatobiliary: No evidence of focal hepatic lesion on noncontrast exam. No calcified gallstone or pericholecystic inflammation. No biliary dilatation. Pancreas: No ductal dilatation or inflammation. Fullness in the region the pancreatic head which is adjacent to retroperitoneal fullness/adenopathy. This is not well assessed on noncontrast exam. Spleen: Normal in  size. Greatest splenic dimension 12.6 cm. No focal abnormality on noncontrast exam. Adrenals/Urinary Tract: Bilateral adrenal thickening. No hydronephrosis. No renal or ureteral calculi. Urinary  bladder is completely empty and not well assessed. Stomach/Bowel: Bowel evaluation is limited in the absence of contrast and presence of intra-abdominal ascites. Small to moderate hiatal hernia. Stomach is decompressed. There is no evidence of small-bowel obstruction. Equivocal wall thickening of the distal and terminal ileum versus nondistention. Normal appendix. Majority of the colon is nondistended and not well assessed. There is no colonic inflammation. Small volume of stool in the sigmoid colon and rectum. Vascular/Lymphatic: Aortic atherosclerosis. No aortic aneurysm. Multifocal adenopathy throughout the abdomen and pelvis. Majority of the lymph nodes are 10-15 mm. There are enlarged retrocrural, retroperitoneal, mesenteric and omental nodes. Retroperitoneal adenopathy is difficult to delineate from the adjacent vascular structures. Representative left periaortic node at the level of the left kidney measures 14 mm, series 2, image 36. Representative ileocolic node measures 10 mm., series 2, image 55. Central mesenteric node measures 15 mm, series 2, image 48. There is no inguinal adenopathy. There is generalized mesenteric and omental edema and fat stranding. Reproductive: Uterus is grossly normal. Limited adnexal assessment. Other: Generalized mesenteric and omental edema and fat stranding. There is a small volume of abdominopelvic ascites. No free air. Mild subcutaneous edema of the anterior abdominal wall. Musculoskeletal: Multilevel degenerative change throughout the spine. Near complete disc space loss at L1-L2. No focal osseous abnormality. IMPRESSION: 1. No renal stones or obstructive uropathy. 2. Multifocal adenopathy throughout the abdomen and pelvis, including retrocrural, retroperitoneal, mesenteric, and  omental enlarged nodes. Majority of these nodes measure 10-15 mm. In the setting of markedly elevated white blood cell count findings are suspicious for lymphoproliferative disorder. Metastatic disease is also considered. 3. Equivocal wall thickening of the distal and terminal ileum versus nondistention. 4. Generalized edema of the mesentery in the anterior omentum. Small volume abdominopelvic ascites. 5. Anterior lingular opacity abutting the left heart border is only partially included in the field of view. This is nonspecific in the setting. Consider completion imaging with chest CT. 6. Small to moderate hiatal hernia. Aortic Atherosclerosis (ICD10-I70.0). Electronically Signed   By: Keith Rake M.D.   On: 05/31/2020 19:48     Assessment and plan- Patient is a 86 y.o. female with T-cell prolymphocytic leukemia admitted for acute kidney injury and worsening leukocytosis  Plan was for transfer to [facility] on 06/02/2020.  She still does not have a bed.  However her clinical condition has deteriorated over the last 24 hours.  She is less responsive and would not verbalize at all when I saw her today.  Her daughter and son at her bedside and they understand that she her clinical status is declining rapidly and she is not a candidate for transfer to [facility] at this time.  They wish to keep her comfortable and they are agreeable for comfort care measures.  Patient is likely to pass during this hospitalization.  She is a DNR.  Recommend discontinuing IV fluids and all lab tests.  Medications like as needed morphine and  only to be given for patient comfort  NP Altha Harm to see patient as well   Visit Diagnosis 1. CLL (chronic lymphocytic leukemia) ([place])   2. AKI (acute kidney injury) ([place])   3. Abnormal findings on diagnostic imaging of abdomen   4. Acute renal failure superimposed on stage 4 chronic kidney disease, unspecified acute renal failure type ([place])   5. T-cell chronic lymphocytic leukemia ([facility])     6. Abdominal distension      Dr. Randa Evens, MD, MPH [facility] [phone] 06/04/2020  11:59 AM

## 2020-06-04 NOTE — Telephone Encounter (Signed)
[facility] called to schedule a HFU  appt scheduled for 7/15

## 2020-06-04 NOTE — Progress Notes (Signed)
[facility] [place] NAME: Haley Love    MR#:  [id number]  DATE OF BIRTH:  06-08-1934  SUBJECTIVE:   Daughter and son in the room. Patient deteriorated quite a bit. She is moaning. Do not have conversation with her.  REVIEW OF SYSTEMS:   Review of Systems  Unable to perform ROS: Medical condition   :   DRUG ALLERGIES:   Allergies  Allergen Reactions  . No Known Allergies     VITALS:  Blood pressure (!) 139/43, pulse 89, temperature 97.9 F (36.6 C), temperature source Axillary, resp. rate 20, height _0  (1.651 m), weight 77 kg, SpO2 94 %.  PHYSICAL EXAMINATION:   Physical Exam limited exam comfort care  GENERAL:  86 y.o.-year-old patient lying in the bed with no acute distress. Appears ill and weak LUNGS: shallow breath sounds bilaterally, no wheezing, rales, rhonchi. No use of accessory muscles of respiration.  CARDIOVASCULAR: S1, S2 normal. No murmurs, rubs, or gallops.  ABDOMEN: Soft, nontender, +distended. Bowel sounds present. No organomegaly or mass.  NEUROLOGIC: lethargic  PSYCHIATRIC:  patient is lethargic SKIN: No obvious rash, lesion, or ulcer.   LABORATORY PANEL:  CBC Recent Labs  Lab 06/03/20 0736  WBC 85.4*  HGB 10.8*  HCT 31.9*  PLT 219    Chemistries  Recent Labs  Lab 05/31/20 1810 06/01/20 0623 06/03/20 0736 06/03/20 1144 06/04/20 0652  NA  --    < >  --  127*  --   K  --    < >  --  5.4*  --   CL  --    < >  --  103  --   CO2  --    < >  --  19*  --   GLUCOSE  --    < >  --  141*  --   BUN  --    < >  --  59*  --   CREATININE  --    < >  --  2.15*  --   CALCIUM  --    < >  --  12.4*  --   MG  --    < >   < >  --  2.0  AST 43*  --   --   --   --   ALT 28  --   --   --   --   ALKPHOS 75  --   --   --   --   BILITOT 1.1  --   --   --   --    < > = values in this interval not displayed.   Cardiac Enzymes No results for input(s): TROPONINI in the last 168 hours. RADIOLOGY:  No  results found. ASSESSMENT AND PLAN:   Haley Blazejewski Ferrellis85 y.o.Caucasian female with hx of CLL, DM, HTN, HLD who presented to ED per PCP recommendation for Cr 3.38 and WBC 75. Patient endorsing intermittent generalized weakness for the past week.  Pt was seen in ED on 6/30 for similar complaints. No improvement since then.  AKI  Hyperkalemia 2/2 AKI -Cr 3.92. BUN 61--IVF--2.15  Concern for tumor lysis syndrome Leukocytosis with history of T cell CLL  Hyperuricemia -Recent WBC trend 54>75>88. - CT renal study showed multifocal adenopathy which is most likely consistent with lymphoproliferative disorder.   -Oncology input from Dr Janese Banks appreciated. She met with son and daughter in the room today. Family understands patient  has declined and will likely pass during hospitalization. Transfer to [facility] canceled. Patient is now comfort care.  Hypercalcemia  HTN   Hx of T2DM Hypoglycemic episode Diet controlled. Last A1c 6.5% on 05/10/20.    Comfort care orders placed. Called [facility] transfer center and canceled patient transfer.  Procedures:none Family communication :son and dter Consults :oncology CODE STATUS: DNR (discussed by Dr Janese Banks) DVT Prophylaxis : none comfort care  Status is: Inpatient  Remains inpatient appropriate because actively dying   [place] THIS PATIENT: *20* minutes.  >50% time spent on counselling and coordination of care  Note: This dictation was prepared with Dragon dictation along with smaller phrase technology. Any transcriptional errors that result from this process are unintentional.  Fritzi Mandes M.D    [facility]   CC: Primary care physician; Crecencio Mc, MDPatient ID: Haley Love, female   DOB: 01-19-1934, 86 y.o.   MRN: [id number]

## 2020-06-04 NOTE — Progress Notes (Signed)
Patient was only able to get down metoprolol with great difficulty. She said the pills just aren't going down. She was able to get the metoprolol down by chewing it up.

## 2020-06-04 NOTE — Progress Notes (Signed)
Order received from Dr Posey Pronto for a once of morphine

## 2020-06-04 NOTE — Care Management Important Message (Signed)
Important Message  Patient Details  Name: Haley Love MRN: [id number] Date of Birth: [birth year]   Medicare Important Message Given:  Yes     Dannette Barbara 06/04/2020, 12:02 PM

## 2020-06-04 NOTE — Telephone Encounter (Signed)
Noted. Will follow as appropriate after discharge.

## 2020-06-05 LAB — MULTIPLE MYELOMA PANEL, SERUM
Albumin SerPl Elph-Mcnc: 2.4 g/dL — ABNORMAL LOW (ref 2.9–4.4)
Albumin/Glob SerPl: 1.1 (ref 0.7–1.7)
Alpha 1: 0.2 g/dL (ref 0.0–0.4)
Alpha2 Glob SerPl Elph-Mcnc: 0.8 g/dL (ref 0.4–1.0)
B-Globulin SerPl Elph-Mcnc: 0.7 g/dL (ref 0.7–1.3)
Gamma Glob SerPl Elph-Mcnc: 0.6 g/dL (ref 0.4–1.8)
Globulin, Total: 2.3 g/dL (ref 2.2–3.9)
IgA: 197 mg/dL (ref 64–422)
IgG (Immunoglobin G), Serum: 570 mg/dL — ABNORMAL LOW (ref 586–1602)
IgM (Immunoglobulin M), Srm: 48 mg/dL (ref 26–217)
M Protein SerPl Elph-Mcnc: 0.2 g/dL — ABNORMAL HIGH
Total Protein ELP: 4.7 g/dL — ABNORMAL LOW (ref 6.0–8.5)

## 2020-06-05 LAB — COMP PANEL: LEUKEMIA/LYMPHOMA: Immunophenotypic Profile: 84

## 2020-06-05 LAB — GLUCOSE 6 PHOSPHATE DEHYDROGENASE
G6PDH: 14.9 U/g{Hb} (ref 4.8–15.7)
Hemoglobin: 10.9 g/dL — ABNORMAL LOW (ref 11.1–15.9)

## 2020-06-05 LAB — HSV(HERPES SIMPLEX VRS) I + II AB-IGM: HSVI/II Comb IgM: <0.91 Ratio (ref 0.00–0.90)

## 2020-06-05 MED ORDER — GLYCOPYRROLATE 0.2 MG/ML IJ SOLN
0.2000 mg | INTRAMUSCULAR | Status: DC | PRN
  Administered 2020-06-05 (×2): 0.2 mg via INTRAVENOUS
  Filled 2020-06-05 (×4): qty 1

## 2020-06-05 MED ORDER — GLYCOPYRROLATE 0.2 MG/ML IJ SOLN
0.2000 mg | INTRAMUSCULAR | Status: DC | PRN
  Filled 2020-06-05: qty 1

## 2020-06-05 MED ORDER — GLYCOPYRROLATE 1 MG PO TABS
1.0000 mg | ORAL_TABLET | ORAL | Status: DC | PRN
  Filled 2020-06-05: qty 1

## 2020-06-07 ENCOUNTER — Inpatient Hospital Stay: Payer: Medicare HMO | Admitting: Internal Medicine

## 2020-06-08 ENCOUNTER — Other Ambulatory Visit: Payer: Medicare HMO

## 2020-06-08 ENCOUNTER — Ambulatory Visit: Payer: Medicare HMO | Admitting: Oncology

## 2020-06-09 ENCOUNTER — Other Ambulatory Visit: Payer: Self-pay | Admitting: Oncology

## 2020-06-19 ENCOUNTER — Other Ambulatory Visit: Payer: Self-pay | Admitting: Internal Medicine

## 2020-06-24 NOTE — Progress Notes (Signed)
Pt resting comfortably, family at bedside. Will continue to monitor.

## 2020-06-24 NOTE — Progress Notes (Signed)
[facility] [place] NAME: Haley Love    MR#:  [id number]  DATE OF BIRTH:  18-May-1934  SUBJECTIVE:   Son in the room. Patient resting   REVIEW OF SYSTEMS:   Review of Systems  Unable to perform ROS: Medical condition   DRUG ALLERGIES:   Allergies  Allergen Reactions  . No Known Allergies     VITALS:  Blood pressure (!) 139/43, pulse 89, temperature 97.9 F (36.6 C), temperature source Axillary, resp. rate 20, height _0  (1.651 m), weight 77 kg, SpO2 94 %.  PHYSICAL EXAMINATION:   Physical Exam limited exam comfort care  GENERAL:  86 y.o.-year-old patient lying in the bed with no acute distress. Appears ill  LUNGS: shallow breath sounds bilaterally CARDIOVASCULAR: S1, S2 normal.  PSYCHIATRIC:  patient is comfort care  LABORATORY PANEL:  CBC Recent Labs  Lab 06/03/20 0736  WBC 85.4*  HGB 10.8*  HCT 31.9*  PLT 219    Chemistries  Recent Labs  Lab 05/31/20 1810 06/01/20 0623 06/03/20 0736 06/03/20 1144 06/04/20 0652  NA  --    < >  --  127*  --   K  --    < >  --  5.4*  --   CL  --    < >  --  103  --   CO2  --    < >  --  19*  --   GLUCOSE  --    < >  --  141*  --   BUN  --    < >  --  59*  --   CREATININE  --    < >  --  2.15*  --   CALCIUM  --    < >  --  12.4*  --   MG  --    < >   < >  --  2.0  AST 43*  --   --   --   --   ALT 28  --   --   --   --   ALKPHOS 75  --   --   --   --   BILITOT 1.1  --   --   --   --    < > = values in this interval not displayed.   Cardiac Enzymes No results for input(s): TROPONINI in the last 168 hours. RADIOLOGY:  No results found. ASSESSMENT AND PLAN:   Haley Brogan Ferrellis85 y.o.Caucasian female with hx of CLL, DM, HTN, HLD who presented to ED per PCP recommendation for Cr 3.38 and WBC 75. Patient endorsing intermittent generalized weakness for the past week.  Pt was seen in ED on 6/30 for similar complaints. No improvement since then.  AKI   Hyperkalemia 2/2 AKI  Concern for tumor lysis syndrome Leukocytosis with history of T cell CLL  Hyperuricemia -Recent WBC trend 54>75>88. - CT renal study showed multifocal adenopathy which is most likely consistent with lymphoproliferative disorder.   -Oncology input from Dr Janese Banks appreciated. She met with son and daughter in the room today. Family understands patient has declined and will likely pass during hospitalization. Transfer to [facility] canceled. Patient is now comfort care.  Hypercalcemia HTN  Hx of T2DM Continue Comfort care .  Appreciate Palliative care input  Procedures:none Family communication :son Consults :oncology CODE STATUS: DNR (discussed by Dr Janese Banks) DVT Prophylaxis : none comfort care  Status is: Inpatient  Remains inpatient  appropriate because actively dying.   TOTAL TIME TAKING CARE OF THIS PATIENT: *15* minutes.  >50% time spent on counselling and coordination of care  Note: This dictation was prepared with Dragon dictation along with smaller phrase technology. Any transcriptional errors that result from this process are unintentional.  Fritzi Mandes M.D    [facility]   CC: Primary care physician; Crecencio Mc, MDPatient ID: Haley Love, female   DOB: July 26, 1934, 86 y.o.   MRN: [id number]

## 2020-06-24 DEATH — deceased

## 2020-07-26 ENCOUNTER — Other Ambulatory Visit: Payer: Medicare HMO

## 2020-08-03 ENCOUNTER — Other Ambulatory Visit: Payer: Self-pay | Admitting: Oncology

## 2020-08-23 ENCOUNTER — Other Ambulatory Visit: Payer: Self-pay | Admitting: Internal Medicine

## 2020-10-25 ENCOUNTER — Other Ambulatory Visit: Payer: Medicare HMO

## 2020-12-04 ENCOUNTER — Ambulatory Visit: Payer: Medicare HMO | Admitting: Oncology

## 2021-02-06 ENCOUNTER — Ambulatory Visit: Payer: Medicare HMO

## 2021-02-20 IMAGING — DX DG ABDOMEN 1V
2 series · 2 of 2 positions shown · non-contrast
Comparison: Subsequent abdominal CT, available at time of
radiograph interpretation.

CLINICAL DATA: Lower abdominal pain.

EXAM:
ABDOMEN - 1 VIEW

[abdomen standing ap (1 of 2)]
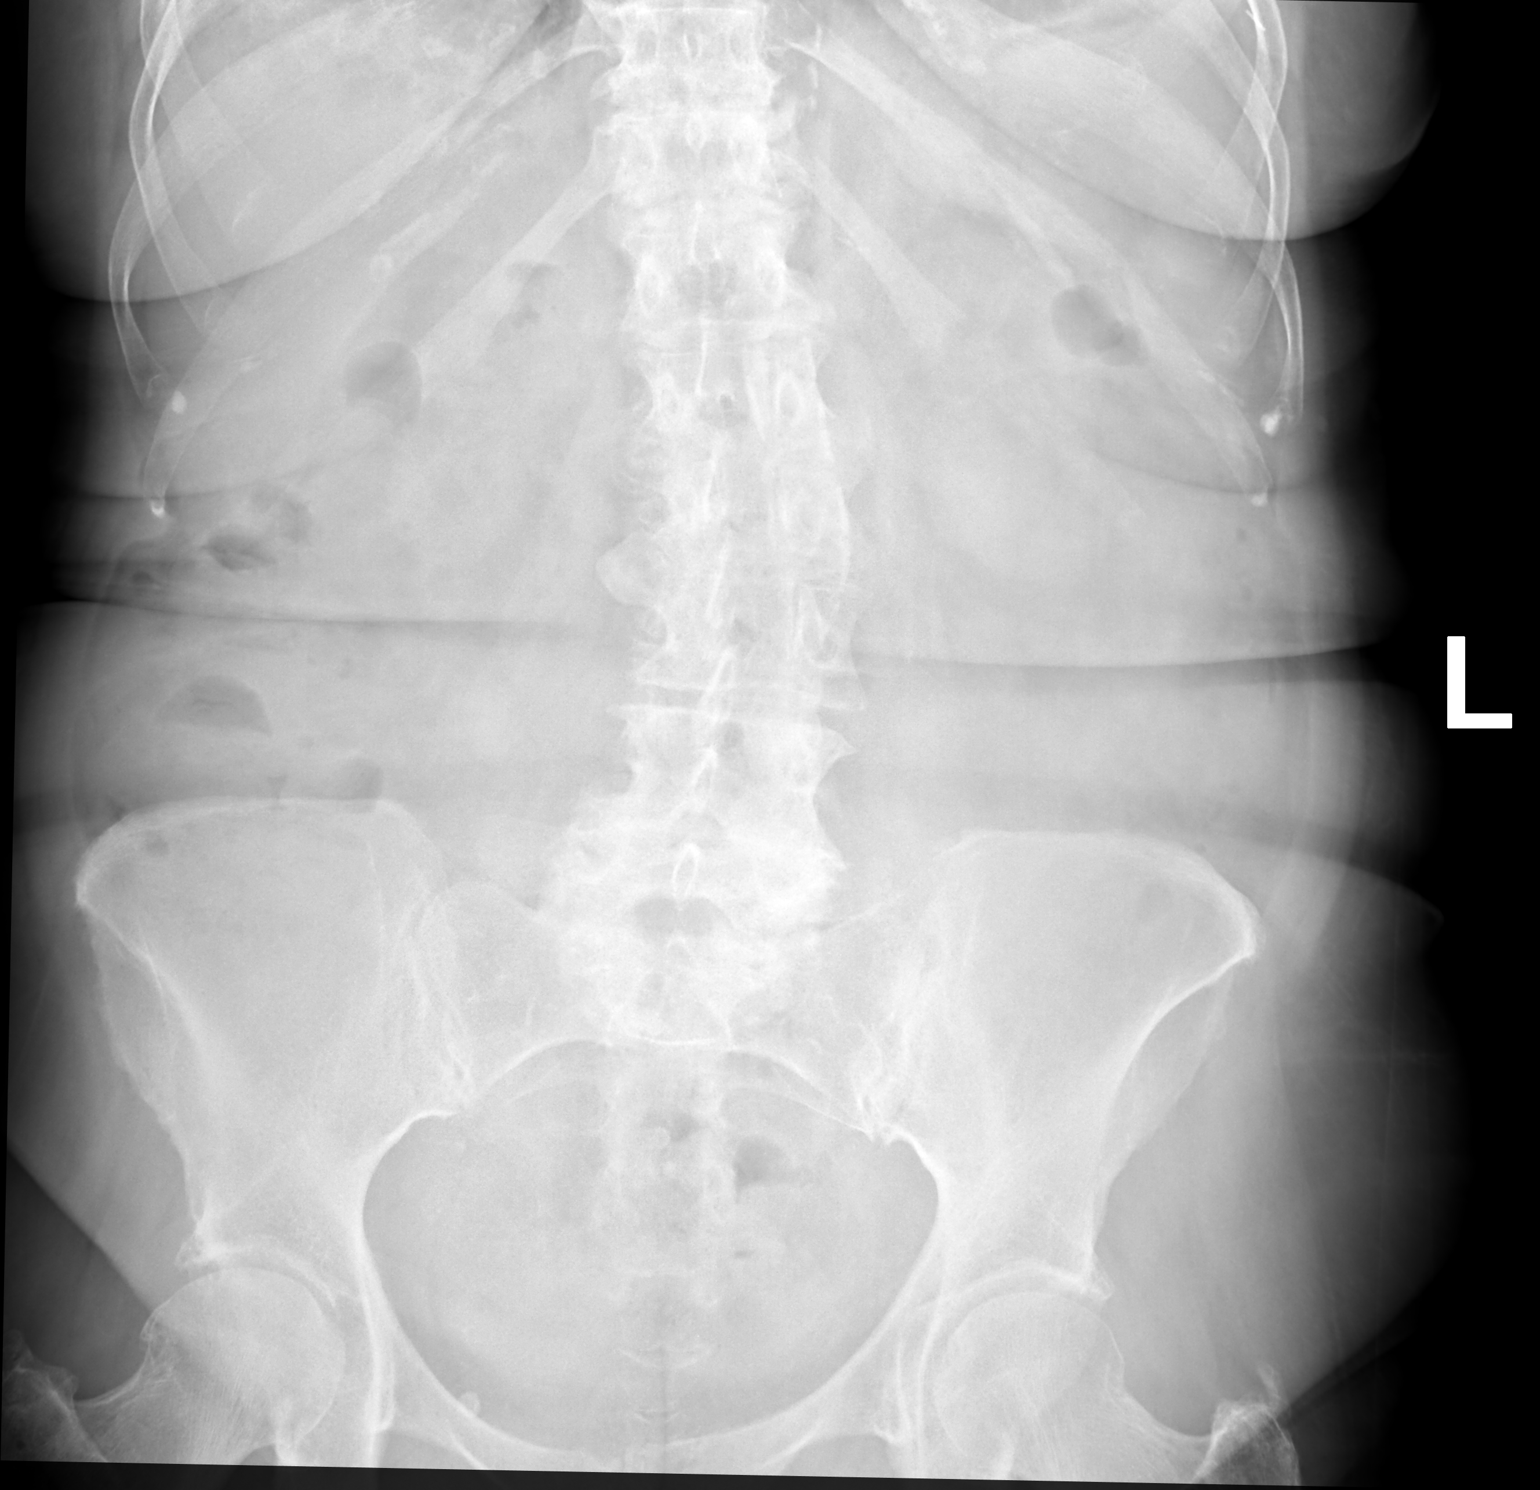

[abdomen standing ap (2 of 2)]
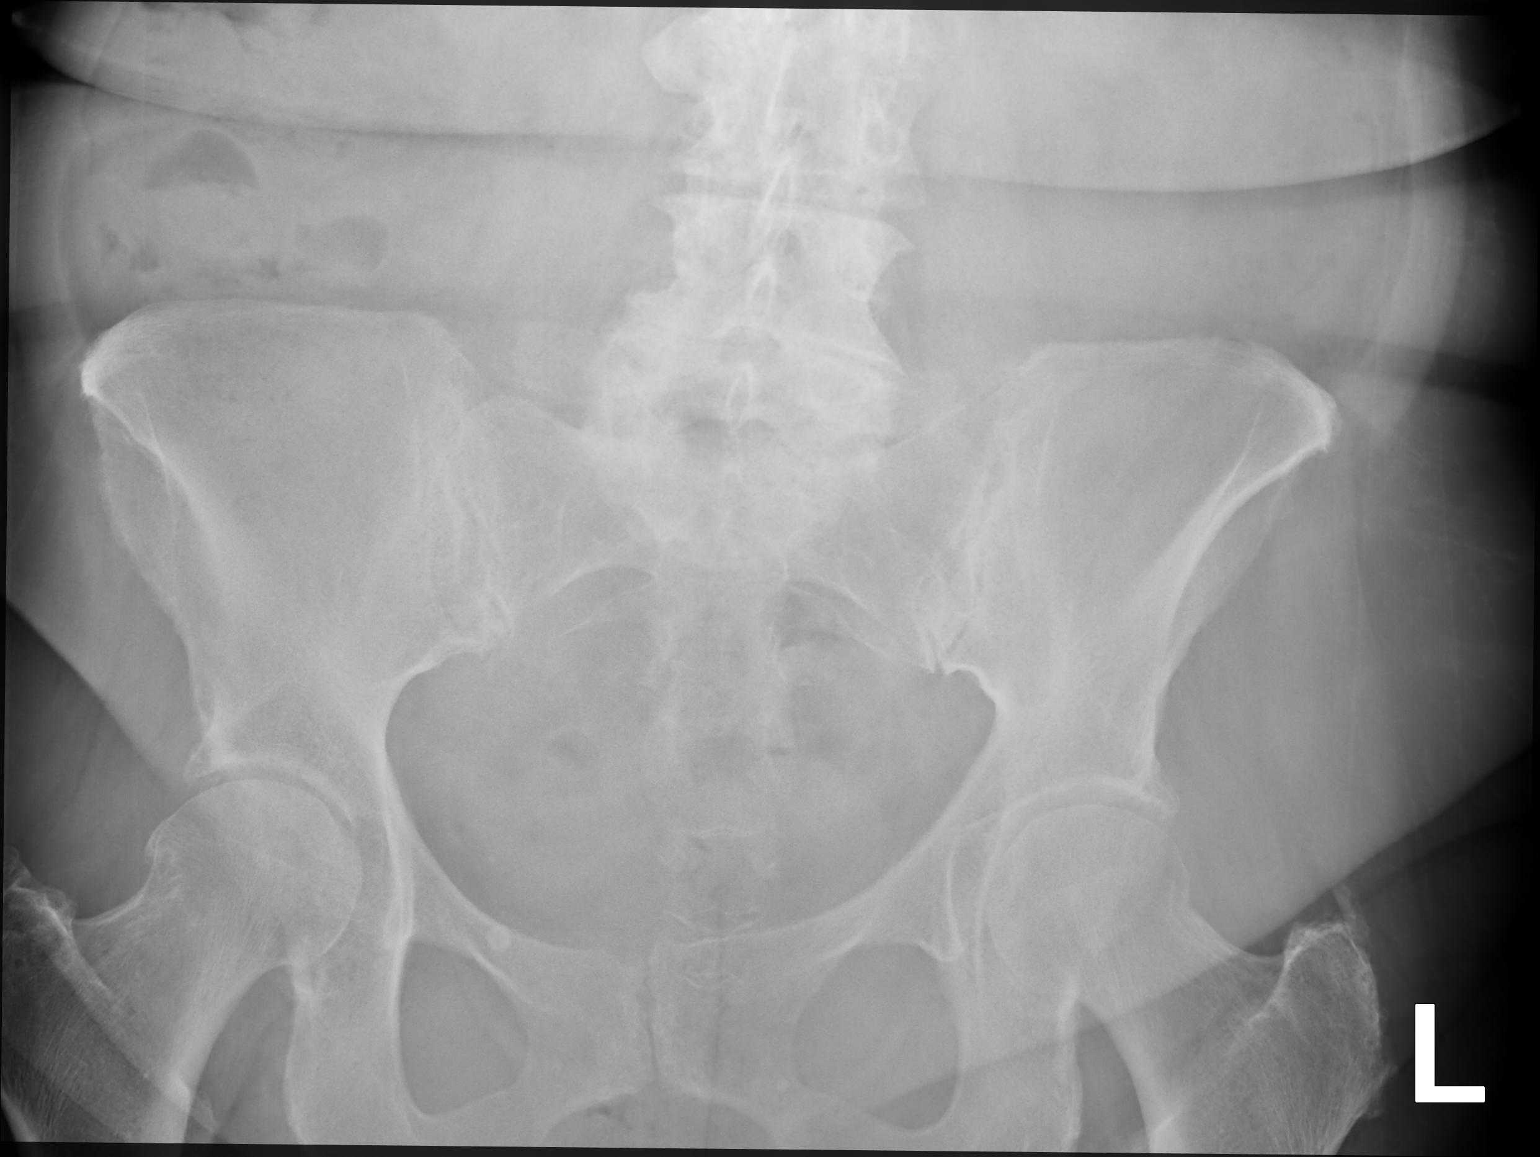

[2 of 2 positions shown; findings below may reference images not displayed]

FINDINGS: No bowel dilatation to suggest obstruction. Small volume of colonic
stool. No evidence of free air. No radiopaque calculi. Right pelvic
phlebolith. Aortic atherosclerosis. Scoliosis and degenerative
change in the spine.
IMPRESSION: Normal bowel gas pattern.

## 2021-02-20 IMAGING — CR DG CHEST 2V
1 series · 2 of 2 positions shown · non-contrast
Comparison: CT 05/31/2020, chest x-ray 05/23/2020

CLINICAL DATA: Abnormal CT

EXAM:
CHEST - 2 VIEW

[Series 1: dg chest 2 view · 0.14mm/px · 2 of 2 slices shown]
[im 1/2]
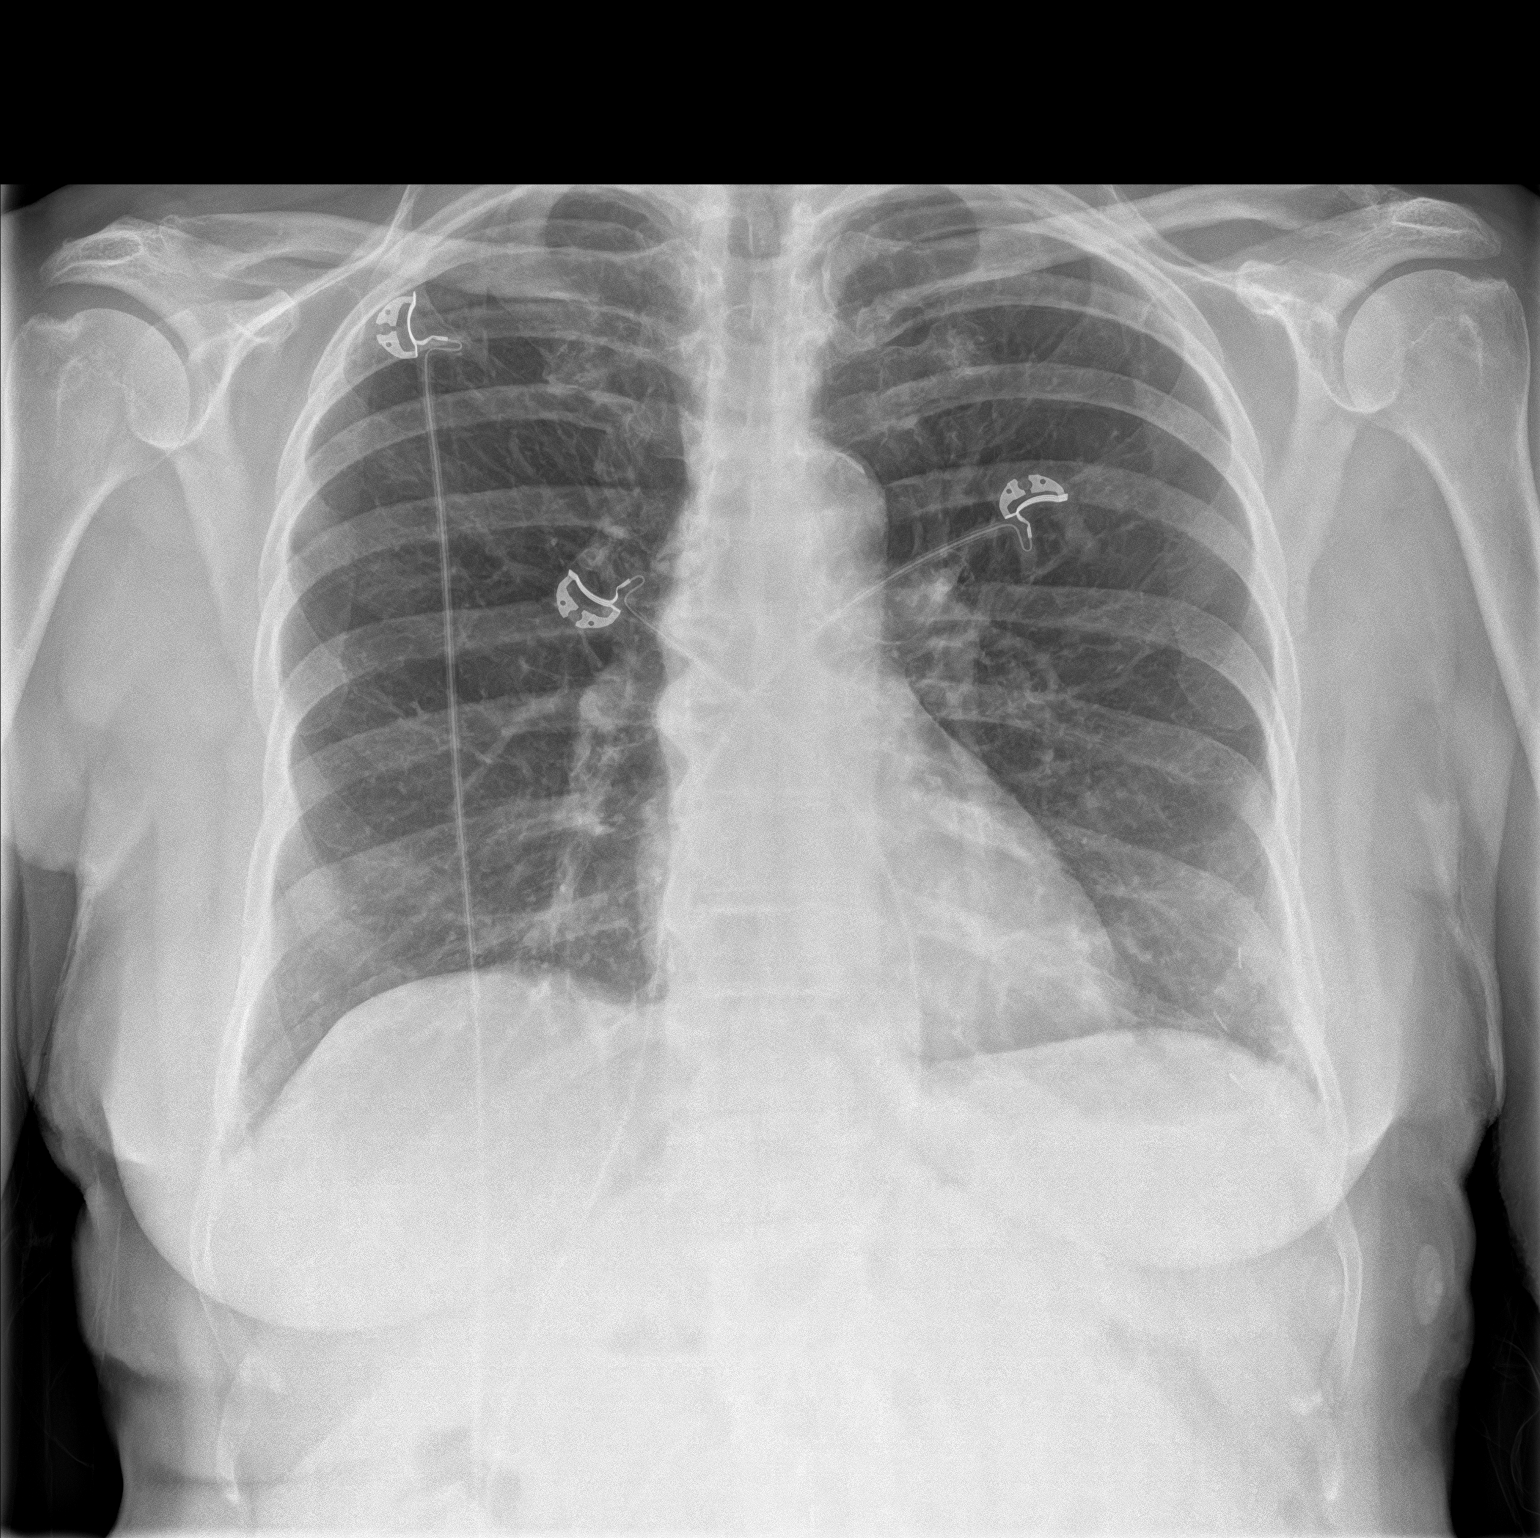
[im 2/2]
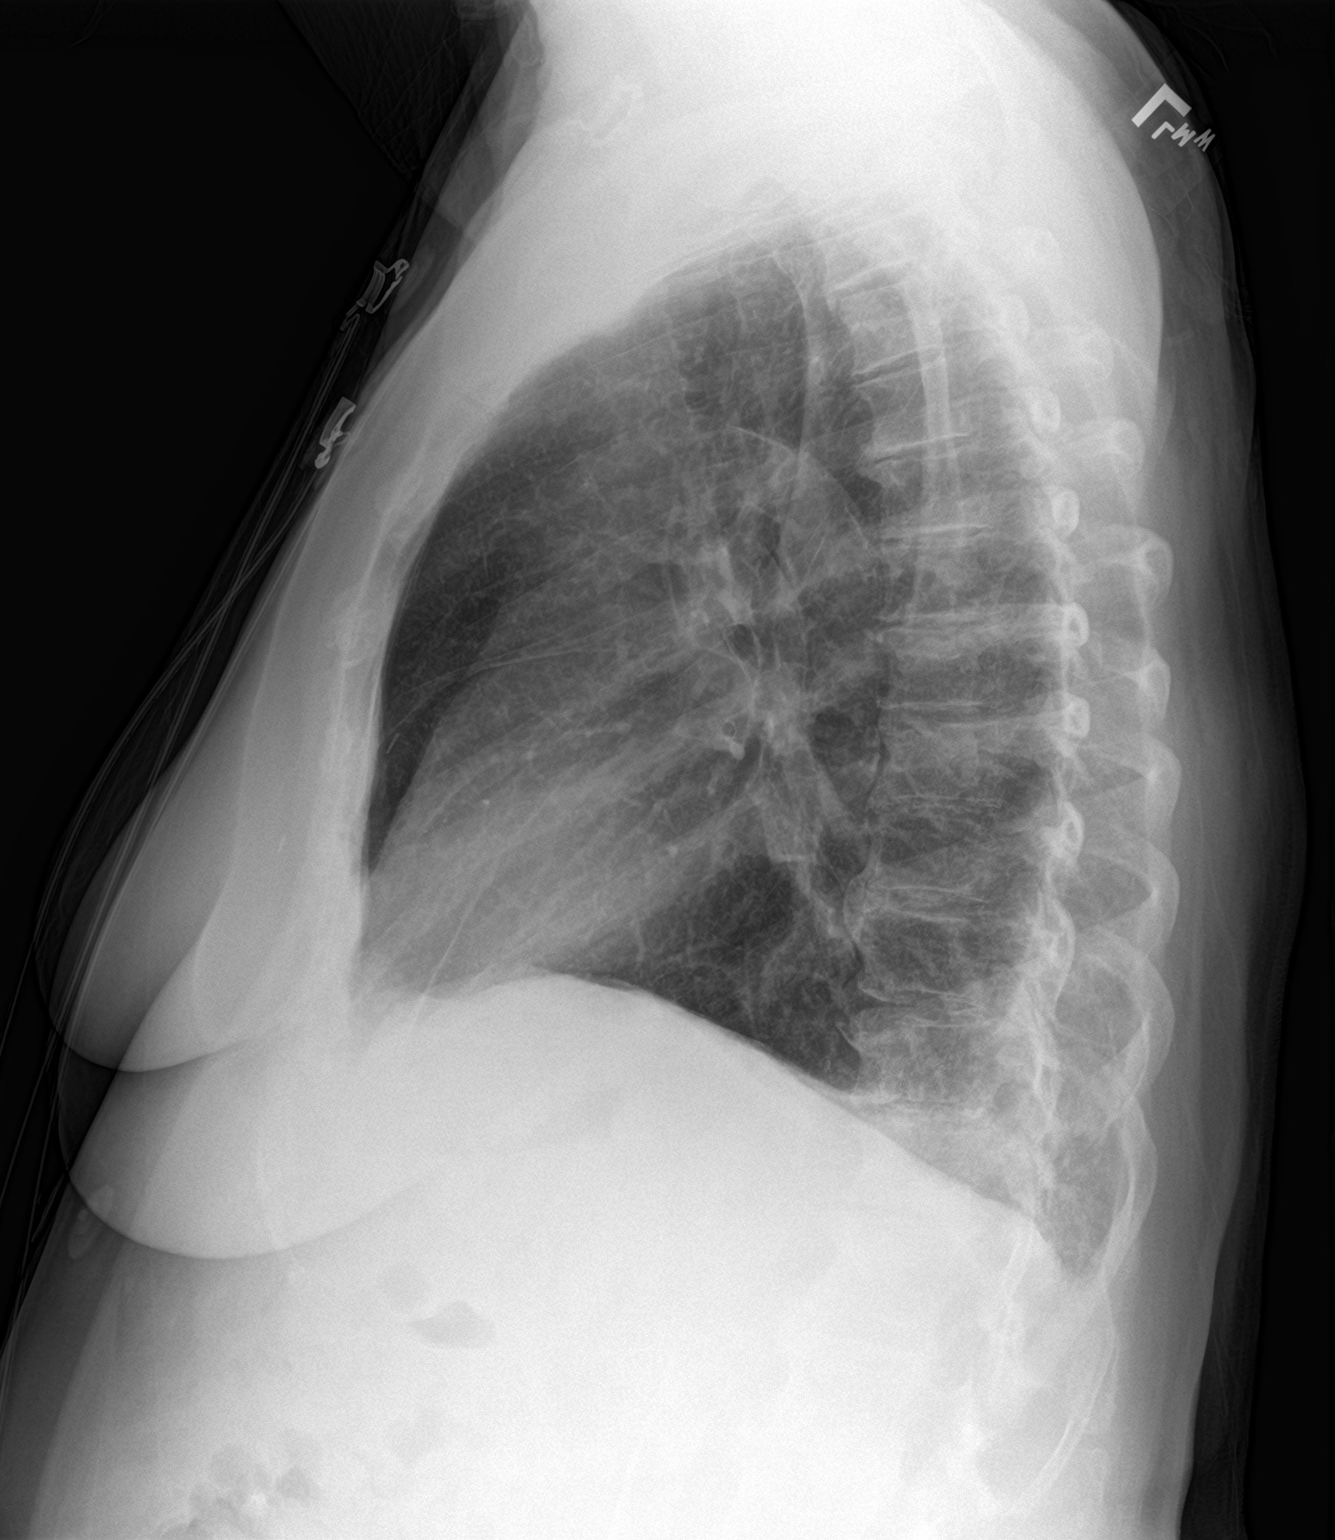

[2 of 2 positions shown; findings below may reference images not displayed]

FINDINGS: Tiny pleural effusions on lateral view. No consolidation or
effusion. Normal heart size. No pneumothorax. Aortic
atherosclerosis.
IMPRESSION: Tiny pleural effusion on lateral view.

## 2022-11-04 NOTE — Telephone Encounter (Signed)
Error
